# Patient Record
Sex: Female | Born: 1955 | Race: White | Hispanic: No | Marital: Married | Smoking: Former smoker
Health system: Southern US, Community
[De-identification: ages and names within clinical notes are randomized; demographics above are authoritative.]

## PROBLEM LIST (undated history)

## (undated) DIAGNOSIS — T7840XA Allergy, unspecified, initial encounter: Secondary | ICD-10-CM

## (undated) DIAGNOSIS — E785 Hyperlipidemia, unspecified: Secondary | ICD-10-CM

## (undated) DIAGNOSIS — K219 Gastro-esophageal reflux disease without esophagitis: Secondary | ICD-10-CM

## (undated) DIAGNOSIS — M1990 Unspecified osteoarthritis, unspecified site: Secondary | ICD-10-CM

## (undated) DIAGNOSIS — J45909 Unspecified asthma, uncomplicated: Secondary | ICD-10-CM

## (undated) DIAGNOSIS — I10 Essential (primary) hypertension: Secondary | ICD-10-CM

## (undated) HISTORY — DX: Essential (primary) hypertension: I10

## (undated) HISTORY — DX: Gastro-esophageal reflux disease without esophagitis: K21.9

## (undated) HISTORY — PX: CHOLECYSTECTOMY: SHX55

## (undated) HISTORY — DX: Allergy, unspecified, initial encounter: T78.40XA

## (undated) HISTORY — DX: Hyperlipidemia, unspecified: E78.5

## (undated) HISTORY — DX: Unspecified asthma, uncomplicated: J45.909

## (undated) HISTORY — PX: ABDOMINAL HYSTERECTOMY: SHX81

## (undated) HISTORY — DX: Unspecified osteoarthritis, unspecified site: M19.90

## (undated) HISTORY — PX: COLONOSCOPY: SHX174

## (undated) HISTORY — PX: UPPER GASTROINTESTINAL ENDOSCOPY: SHX188

---

## 1999-08-30 ENCOUNTER — Encounter: Payer: Self-pay | Admitting: Emergency Medicine

## 1999-08-30 ENCOUNTER — Emergency Department (HOSPITAL_COMMUNITY): Admission: EM | Admit: 1999-08-30 | Discharge: 1999-08-30 | Payer: Self-pay | Admitting: Emergency Medicine

## 1999-09-02 ENCOUNTER — Emergency Department (HOSPITAL_COMMUNITY): Admission: EM | Admit: 1999-09-02 | Discharge: 1999-09-02 | Payer: Self-pay | Admitting: Emergency Medicine

## 1999-09-03 ENCOUNTER — Encounter: Payer: Self-pay | Admitting: Emergency Medicine

## 2002-12-06 ENCOUNTER — Emergency Department (HOSPITAL_COMMUNITY): Admission: EM | Admit: 2002-12-06 | Discharge: 2002-12-06 | Payer: Self-pay | Admitting: Emergency Medicine

## 2004-05-28 ENCOUNTER — Emergency Department (HOSPITAL_COMMUNITY): Admission: EM | Admit: 2004-05-28 | Discharge: 2004-05-28 | Payer: Self-pay | Admitting: Emergency Medicine

## 2005-07-24 ENCOUNTER — Emergency Department (HOSPITAL_COMMUNITY): Admission: EM | Admit: 2005-07-24 | Discharge: 2005-07-24 | Payer: Self-pay | Admitting: Emergency Medicine

## 2005-07-30 ENCOUNTER — Emergency Department (HOSPITAL_COMMUNITY): Admission: EM | Admit: 2005-07-30 | Discharge: 2005-07-30 | Payer: Self-pay | Admitting: Family Medicine

## 2005-12-02 ENCOUNTER — Ambulatory Visit: Payer: Self-pay | Admitting: Internal Medicine

## 2006-01-09 ENCOUNTER — Ambulatory Visit: Payer: Self-pay | Admitting: Internal Medicine

## 2007-03-07 ENCOUNTER — Emergency Department (HOSPITAL_COMMUNITY): Admission: EM | Admit: 2007-03-07 | Discharge: 2007-03-08 | Payer: Self-pay | Admitting: Emergency Medicine

## 2007-04-27 ENCOUNTER — Other Ambulatory Visit: Admission: RE | Admit: 2007-04-27 | Discharge: 2007-04-27 | Payer: Self-pay | Admitting: Family Medicine

## 2007-11-10 ENCOUNTER — Emergency Department (HOSPITAL_COMMUNITY): Admission: EM | Admit: 2007-11-10 | Discharge: 2007-11-10 | Payer: Self-pay | Admitting: Emergency Medicine

## 2010-12-27 ENCOUNTER — Encounter: Payer: Self-pay | Admitting: Internal Medicine

## 2011-03-05 ENCOUNTER — Other Ambulatory Visit: Payer: Self-pay | Admitting: Physician Assistant

## 2011-03-05 DIAGNOSIS — Z1231 Encounter for screening mammogram for malignant neoplasm of breast: Secondary | ICD-10-CM

## 2011-03-13 ENCOUNTER — Ambulatory Visit
Admission: RE | Admit: 2011-03-13 | Discharge: 2011-03-13 | Disposition: A | Discharge status: Home / Self Care | Payer: 59 | Source: Ambulatory Visit | Attending: Physician Assistant | Admitting: Physician Assistant

## 2011-03-13 DIAGNOSIS — Z1231 Encounter for screening mammogram for malignant neoplasm of breast: Secondary | ICD-10-CM

## 2011-03-24 ENCOUNTER — Other Ambulatory Visit: Payer: Self-pay | Admitting: Physician Assistant

## 2011-03-24 DIAGNOSIS — R928 Other abnormal and inconclusive findings on diagnostic imaging of breast: Secondary | ICD-10-CM

## 2011-03-27 LAB — URINE CULTURE

## 2011-03-27 LAB — URINALYSIS, ROUTINE W REFLEX MICROSCOPIC
Glucose, UA: NEGATIVE
Ketones, ur: NEGATIVE
Nitrite: NEGATIVE
Protein, ur: NEGATIVE
Urobilinogen, UA: 0.2

## 2011-03-27 LAB — URINE MICROSCOPIC-ADD ON

## 2011-04-04 ENCOUNTER — Ambulatory Visit
Admission: RE | Admit: 2011-04-04 | Discharge: 2011-04-04 | Disposition: A | Discharge status: Home / Self Care | Payer: 59 | Source: Ambulatory Visit | Attending: Physician Assistant | Admitting: Physician Assistant

## 2011-04-04 DIAGNOSIS — R928 Other abnormal and inconclusive findings on diagnostic imaging of breast: Secondary | ICD-10-CM

## 2011-04-07 ENCOUNTER — Other Ambulatory Visit: Payer: Self-pay | Admitting: Internal Medicine

## 2012-03-08 ENCOUNTER — Encounter: Payer: Self-pay | Admitting: Internal Medicine

## 2012-04-01 ENCOUNTER — Encounter: Payer: Self-pay | Admitting: Internal Medicine

## 2012-04-22 ENCOUNTER — Ambulatory Visit (AMBULATORY_SURGERY_CENTER): Payer: 59 | Admitting: *Deleted

## 2012-04-22 ENCOUNTER — Encounter: Payer: Self-pay | Admitting: Internal Medicine

## 2012-04-22 VITALS — Ht 60.0 in | Wt 210.2 lb

## 2012-04-22 DIAGNOSIS — Z800 Family history of malignant neoplasm of digestive organs: Secondary | ICD-10-CM

## 2012-04-22 DIAGNOSIS — Z1211 Encounter for screening for malignant neoplasm of colon: Secondary | ICD-10-CM

## 2012-04-22 MED ORDER — NA SULFATE-K SULFATE-MG SULF 17.5-3.13-1.6 GM/177ML PO SOLN: 1.0000 | Freq: Once | ORAL | Status: DC

## 2012-04-22 NOTE — Progress Notes (Signed)
No allergy to egg or soy products  

## 2012-05-04 ENCOUNTER — Other Ambulatory Visit (HOSPITAL_COMMUNITY)
Admission: RE | Admit: 2012-05-04 | Discharge: 2012-05-04 | Disposition: A | Discharge status: Home / Self Care | Payer: 59 | Source: Ambulatory Visit | Attending: Family Medicine | Admitting: Family Medicine

## 2012-05-04 ENCOUNTER — Other Ambulatory Visit: Payer: Self-pay | Admitting: Physician Assistant

## 2012-05-04 DIAGNOSIS — Z0000 Encounter for general adult medical examination without abnormal findings: Secondary | ICD-10-CM | POA: Insufficient documentation

## 2012-05-06 ENCOUNTER — Ambulatory Visit (AMBULATORY_SURGERY_CENTER): Payer: 59 | Admitting: Internal Medicine

## 2012-05-06 ENCOUNTER — Encounter: Payer: Self-pay | Admitting: Internal Medicine

## 2012-05-06 VITALS — BP 109/76 | HR 62 | Temp 98.2°F | Resp 19 | Ht 60.0 in | Wt 210.0 lb

## 2012-05-06 DIAGNOSIS — D126 Benign neoplasm of colon, unspecified: Secondary | ICD-10-CM

## 2012-05-06 DIAGNOSIS — K635 Polyp of colon: Secondary | ICD-10-CM

## 2012-05-06 DIAGNOSIS — Z800 Family history of malignant neoplasm of digestive organs: Secondary | ICD-10-CM

## 2012-05-06 DIAGNOSIS — K5730 Diverticulosis of large intestine without perforation or abscess without bleeding: Secondary | ICD-10-CM

## 2012-05-06 DIAGNOSIS — Z1211 Encounter for screening for malignant neoplasm of colon: Secondary | ICD-10-CM

## 2012-05-06 DIAGNOSIS — K648 Other hemorrhoids: Secondary | ICD-10-CM

## 2012-05-06 MED ORDER — SODIUM CHLORIDE 0.9 % IV SOLN: 500.0000 mL | INTRAVENOUS | Status: DC

## 2012-05-06 NOTE — Progress Notes (Signed)
Propofol given over incremental dosages 

## 2012-05-06 NOTE — Op Note (Signed)
[facility] [address] [place], [address]   COLONOSCOPY PROCEDURE REPORT  PATIENT: Alicia Miranda, Alicia Miranda  MR#: [id number] BIRTHDATE: [birth year] , 56  yrs. old GENDER: Female ENDOSCOPIST: Iva Boop, MD, [facility] PROCEDURE DATE:  05/06/2012 PROCEDURE:   Colonoscopy with snare polypectomy ASA CLASS:   Class II INDICATIONS:Patient's immediate family history of colon cancer and elevated risk screening. MEDICATIONS: propofol (Diprivan) 250mg  IV, MAC sedation, administered by CRNA, and These medications were titrated to patient response per physician's verbal order  DESCRIPTION OF PROCEDURE:   After the risks benefits and alternatives of the procedure were thoroughly explained, informed consent was obtained.  A digital rectal exam revealed no abnormalities of the rectum.   The LB [id number]  endoscope was introduced through the anus and advanced to the cecum, which was identified by both the appendix and ileocecal valve. No adverse events experienced.   The quality of the prep was Suprep good  The instrument was then slowly withdrawn as the colon was fully examined.      COLON FINDINGS: A smooth sessile polyp measuring 5 mm in size was found in the sigmoid colon.  A polypectomy was performed with a cold snare.  The resection was complete and the polyp tissue was completely retrieved.   Moderate diverticulosis was noted throughout the entire examined colon.   Small internal hemorrhoids were found.   The colon mucosa was otherwise normal.  Retroflexed views revealed internal hemorrhoids. The time to cecum=2 minutes 00 seconds.  Withdrawal time=10 minutes 15 seconds.  The scope was withdrawn and the procedure completed. COMPLICATIONS: There were no complications.  ENDOSCOPIC IMPRESSION: 1.   Sessile polyp measuring 5 mm in size was found in the sigmoid colon; polypectomy was performed with a cold snare 2.   Moderate diverticulosis was noted throughout the  entire examined colon 3.   Small internal hemorrhoids 4.   The colon mucosa was otherwise normal with good prep - family hx colon cancer brother dx 39  RECOMMENDATIONS: Timing of repeat colonoscopy will be determined by pathology findings.   eSigned:  Iva Boop, MD, [facility] 05/06/2012 11:55 AM cc: The Patient    and Carolyne Fiscal, MD

## 2012-05-06 NOTE — Progress Notes (Signed)
Patient did not experience any of the following events: a burn prior to discharge; a fall within the facility; wrong site/side/patient/procedure/implant event; or a hospital transfer or hospital admission upon discharge from the facility. (G8907) Patient did not have preoperative order for IV antibiotic SSI prophylaxis. (G8918)   Charted by Lucindia Mirts RN 

## 2012-05-06 NOTE — Patient Instructions (Addendum)
A small polyp was removed. It looks benign. Diverticulosis and internal hemorrhoids were also seen as before.  Thank you for choosing me and [facility].  Iva Boop, MD, FACG YOU HAD AN ENDOSCOPIC PROCEDURE TODAY AT THE [facility]: Refer to the procedure report that was given to you for any specific questions about what was found during the examination.  If the procedure report does not answer your questions, please call your gastroenterologist to clarify.  If you requested that your care partner not be given the details of your procedure findings, then the procedure report has been included in a sealed envelope for you to review at your convenience later.  YOU SHOULD EXPECT: Some feelings of bloating in the abdomen. Passage of more gas than usual.  Walking can help get rid of the air that was put into your GI tract during the procedure and reduce the bloating. If you had a lower endoscopy (such as a colonoscopy or flexible sigmoidoscopy) you may notice spotting of blood in your stool or on the toilet paper. If you underwent a bowel prep for your procedure, then you may not have a normal bowel movement for a few days.  DIET: Your first meal following the procedure should be a light meal and then it is ok to progress to your normal diet.  A half-sandwich or bowl of soup is an example of a good first meal.  Heavy or fried foods are harder to digest and may make you feel nauseous or bloated.  Likewise meals heavy in dairy and vegetables can cause extra gas to form and this can also increase the bloating.  Drink plenty of fluids but you should avoid alcoholic beverages for 24 hours.  ACTIVITY: Your care partner should take you home directly after the procedure.  You should plan to take it easy, moving slowly for the rest of the day.  You can resume normal activity the day after the procedure however you should NOT DRIVE or use heavy machinery for 24 hours (because of the  sedation medicines used during the test).    SYMPTOMS TO REPORT IMMEDIATELY: A gastroenterologist can be reached at any hour.  During normal business hours, 8:30 AM to 5:00 PM Monday through Friday, call [phone].  After hours and on weekends, please call the GI answering service at [phone] who will take a message and have the physician on call contact you.   Following lower endoscopy (colonoscopy or flexible sigmoidoscopy):  Excessive amounts of blood in the stool  Significant tenderness or worsening of abdominal pains  Swelling of the abdomen that is new, acute  Fever of 100F or higher  FOLLOW UP: If any biopsies were taken you will be contacted by phone or by letter within the next 1-3 weeks.  Call your gastroenterologist if you have not heard about the biopsies in 3 weeks.  Our staff will call the home number listed on your records the next business day following your procedure to check on you and address any questions or concerns that you may have at that time regarding the information given to you following your procedure. This is a courtesy call and so if there is no answer at the home number and we have not heard from you through the emergency physician on call, we will assume that you have returned to your regular daily activities without incident.  SIGNATURES/CONFIDENTIALITY: You and/or your care partner have signed paperwork which will be entered into your electronic medical  record.  These signatures attest to the fact that that the information above on your After Visit Summary has been reviewed and is understood.  Full responsibility of the confidentiality of this discharge information lies with you and/or your care-partner.   Recommendations:  Polyps-handout given  Diverticulosis- handout given  High fiber diet-handout given  Hemorrhoids-handout given  Repeat colonoscopy determined by pathology

## 2012-05-07 ENCOUNTER — Telehealth: Payer: Self-pay | Admitting: *Deleted

## 2012-05-07 NOTE — Telephone Encounter (Signed)
Left message that called for f/u 

## 2012-05-10 ENCOUNTER — Other Ambulatory Visit: Payer: Self-pay | Admitting: Physician Assistant

## 2012-05-10 DIAGNOSIS — Z1231 Encounter for screening mammogram for malignant neoplasm of breast: Secondary | ICD-10-CM

## 2012-05-12 ENCOUNTER — Encounter: Payer: Self-pay | Admitting: Internal Medicine

## 2012-05-12 NOTE — Progress Notes (Signed)
Quick Note:  Not a polyp Repeat colonoscopy 04/2017 - Fam Hx CRCA ______

## 2012-06-01 ENCOUNTER — Ambulatory Visit
Admission: RE | Admit: 2012-06-01 | Discharge: 2012-06-01 | Disposition: A | Discharge status: Home / Self Care | Payer: 59 | Source: Ambulatory Visit | Attending: Physician Assistant | Admitting: Physician Assistant

## 2012-06-01 DIAGNOSIS — Z1231 Encounter for screening mammogram for malignant neoplasm of breast: Secondary | ICD-10-CM

## 2012-12-24 ENCOUNTER — Other Ambulatory Visit: Payer: Self-pay | Admitting: Physician Assistant

## 2012-12-24 ENCOUNTER — Ambulatory Visit
Admission: RE | Admit: 2012-12-24 | Discharge: 2012-12-24 | Disposition: A | Discharge status: Home / Self Care | Payer: PRIVATE HEALTH INSURANCE | Source: Ambulatory Visit | Attending: Physician Assistant | Admitting: Physician Assistant

## 2012-12-24 DIAGNOSIS — R52 Pain, unspecified: Secondary | ICD-10-CM

## 2012-12-24 DIAGNOSIS — R609 Edema, unspecified: Secondary | ICD-10-CM

## 2013-03-24 ENCOUNTER — Emergency Department (HOSPITAL_COMMUNITY)
Admission: EM | Admit: 2013-03-24 | Discharge: 2013-03-24 | Disposition: A | Discharge status: Home / Self Care | Payer: PRIVATE HEALTH INSURANCE | Attending: Emergency Medicine | Admitting: Emergency Medicine

## 2013-03-24 ENCOUNTER — Encounter (HOSPITAL_COMMUNITY): Payer: Self-pay | Admitting: Emergency Medicine

## 2013-03-24 ENCOUNTER — Emergency Department (HOSPITAL_COMMUNITY): Payer: PRIVATE HEALTH INSURANCE

## 2013-03-24 DIAGNOSIS — I10 Essential (primary) hypertension: Secondary | ICD-10-CM | POA: Insufficient documentation

## 2013-03-24 DIAGNOSIS — J45909 Unspecified asthma, uncomplicated: Secondary | ICD-10-CM | POA: Insufficient documentation

## 2013-03-24 DIAGNOSIS — S93499A Sprain of other ligament of unspecified ankle, initial encounter: Secondary | ICD-10-CM | POA: Insufficient documentation

## 2013-03-24 DIAGNOSIS — M129 Arthropathy, unspecified: Secondary | ICD-10-CM | POA: Insufficient documentation

## 2013-03-24 DIAGNOSIS — Y92009 Unspecified place in unspecified non-institutional (private) residence as the place of occurrence of the external cause: Secondary | ICD-10-CM | POA: Insufficient documentation

## 2013-03-24 DIAGNOSIS — W010XXA Fall on same level from slipping, tripping and stumbling without subsequent striking against object, initial encounter: Secondary | ICD-10-CM | POA: Insufficient documentation

## 2013-03-24 DIAGNOSIS — K219 Gastro-esophageal reflux disease without esophagitis: Secondary | ICD-10-CM | POA: Insufficient documentation

## 2013-03-24 DIAGNOSIS — Z87891 Personal history of nicotine dependence: Secondary | ICD-10-CM | POA: Insufficient documentation

## 2013-03-24 DIAGNOSIS — E785 Hyperlipidemia, unspecified: Secondary | ICD-10-CM | POA: Insufficient documentation

## 2013-03-24 DIAGNOSIS — S86012A Strain of left Achilles tendon, initial encounter: Secondary | ICD-10-CM

## 2013-03-24 DIAGNOSIS — Y9301 Activity, walking, marching and hiking: Secondary | ICD-10-CM | POA: Insufficient documentation

## 2013-03-24 DIAGNOSIS — Z79899 Other long term (current) drug therapy: Secondary | ICD-10-CM | POA: Insufficient documentation

## 2013-03-24 MED ORDER — HYDROCODONE-ACETAMINOPHEN 5-325 MG PO TABS: 1.0000 | ORAL_TABLET | ORAL | Status: DC | PRN

## 2013-03-24 NOTE — ED Notes (Signed)
Ortho tech at bedside 

## 2013-03-24 NOTE — ED Provider Notes (Signed)
CSN: [id number]     Arrival date & time 03/24/13  1909 History  This chart was scribed for non-physician practitioner Coral Ceo, PA-C, working with Richardean Canal, MD by Dorothey Baseman, ED Scribe. This patient was seen in room TR09C/TR09C and the patient's care was started at 9:27 PM.    Chief Complaint  Patient presents with  . Fall  . Ankle Injury   The history is provided by the patient. No language interpreter was used.   HPI Comments: Alicia Miranda is a 57 y.o. female with a PMH of arthritis, asthma, GERD, HLD, and HTN who presents to the Emergency Department complaining of a fall and ankle injury. Patient states that about 3 hours prior to arrival she was walking out her door when she stepped down and felt a sharp sudden pain in the back of her left ankle with radiation up the back of her left calf.  She denies any inversion or eversion type of injury. She denies any fall or other injuries (confirmed with patient - contradicting with nursing notes). She states she has been able to ambulate however with difficulty due to pain. She denies any weakness, loss of sensation, numbness, or tingling. She denies any pain in her ankle or calf prior to the onset of her injury.  Patient reports that the pain is exacerbated with walking. She reports some associated swelling to the area. Patient reports that she took some arthritis medication earlier this morning, but has not taken anything for pain since her incident. She denies any previous fx or surgeries on her left ankle. She denies fever, nausea, vomiting, abdominal pain, or knee pain. Patient denies any chronic steroid medication use or recent antibiotic use. Patient is a non-smoker, however, has smoke exposure.    Past Medical History  Diagnosis Date  . Allergy   . Arthritis     legs  . Asthma   . GERD (gastroesophageal reflux disease)   . Hyperlipidemia   . Hypertension    Past Surgical History  Procedure Laterality Date  . Abdominal  hysterectomy    . Cholecystectomy    . Upper gastrointestinal endoscopy    . Colonoscopy     Family History  Problem Relation Age of Onset  . Colon cancer Brother   . Esophageal cancer Neg Hx   . Rectal cancer Neg Hx   . Stomach cancer Neg Hx    History  Substance Use Topics  . Smoking status: Former Games developer  . Smokeless tobacco: Never Used  . Alcohol Use: Yes     Comment: very rarely   OB History   Grav Para Term Preterm Abortions TAB SAB Ect Mult Living                 Review of Systems  Constitutional: Negative for fever, chills, activity change, appetite change and fatigue.  HENT: Negative for trouble swallowing.   Eyes: Negative for visual disturbance.  Respiratory: Negative for cough and shortness of breath.   Cardiovascular: Negative for chest pain, palpitations and leg swelling.  Gastrointestinal: Negative for nausea, vomiting and abdominal pain.  Genitourinary: Negative for dysuria.  Musculoskeletal: Positive for arthralgias ( positive for left ankle pain, negative for knee pain), gait problem and joint swelling. Negative for back pain, myalgias and neck pain.  Skin: Negative for wound.  Neurological: Negative for dizziness, syncope, weakness, light-headedness, numbness and headaches.  Psychiatric/Behavioral: Negative for confusion.    Allergies  Codeine  Home Medications   Current Outpatient Rx  Name  Route  Sig  Dispense  Refill  . diclofenac (VOLTAREN) 75 MG EC tablet      1 tablet Twice daily as needed.         . furosemide (LASIX) 20 MG tablet      Takes 1-2 tablets daily         . losartan (COZAAR) 50 MG tablet      1 tablet once.         . Misc Natural Products (OSTEO BI-FLEX JOINT SHIELD PO)   Oral   Take 1 tablet by mouth daily.         . Multiple Vitamins-Minerals (MULTIVITAMIN PO)   Oral   Take by mouth daily.         . Omega-3 Fatty Acids (FISH OIL PO)   Oral   Take by mouth daily.         . pantoprazole (PROTONIX)  20 MG tablet   Oral   Take 20 mg by mouth daily. May take 1 to 2 tablets daily         . rosuvastatin (CRESTOR) 20 MG tablet   Oral   Take 20 mg by mouth daily. Takes 0.5 tablet daily          Triage Vitals: Pulse 81  Temp(Src) 98.2 F (36.8 C) (Oral)  Resp 20  Ht 5' (1.524 m)  Wt 218 lb 0.6 oz (98.9 kg)  BMI 42.58 kg/m2  SpO2 94%  Filed Vitals:   03/24/13 1916 03/24/13 2140 03/24/13 2258  BP:  139/81   Pulse: 81 77 68  Temp: 98.2 F (36.8 C)    TempSrc: Oral    Resp: 20 16   Height: 5' (1.524 m)    Weight: 218 lb 0.6 oz (98.9 kg)    SpO2: 94% 95% 98%    Physical Exam  Nursing note and vitals reviewed. Constitutional: She is oriented to person, place, and time. She appears well-developed and well-nourished. No distress.  HENT:  Head: Normocephalic and atraumatic.  Right Ear: External ear normal.  Left Ear: External ear normal.  Eyes: Conjunctivae are normal. Right eye exhibits no discharge. Left eye exhibits no discharge.  Neck: Normal range of motion. Neck supple.  Cardiovascular: Normal rate, regular rhythm, normal heart sounds and intact distal pulses.  Exam reveals no gallop and no friction rub.   No murmur heard. Dorsalis pedis pulses present bilaterally  Pulmonary/Chest: Effort normal and breath sounds normal. No respiratory distress. She has no wheezes. She has no rales. She exhibits no tenderness.  Abdominal: Soft. She exhibits no distension. There is no tenderness.  Musculoskeletal: Normal range of motion. She exhibits edema and tenderness.  Palpable gap to the area of the achilles tendon on the left. Positive Thompson's test on the left. Negative Thompson test on the right. Tenderness to the posterior ankle and distal calf. No tenderness to palpation to the malleoli bilaterally, calcaneus, or foot diffusely. No knee tenderness bilaterally. Patient able to actively dorsiflex and plantarflex left ankle. Patient able to flex and extend digits of left foot  without difficulty or limitations. Patient able to actively flex and extend knees and hips bilaterally. No calf edema or erythema bilaterally.   Neurological: She is alert and oriented to person, place, and time.  Sensation intact in the lower extremities bilaterally  Skin: Skin is warm and dry.  Mild edema to the posterior left ankle diffusely. No erythema, ecchymosis, or open wounds.    Psychiatric: She has a normal  mood and affect. Her behavior is normal.    ED Course  Procedures (including critical care time)  DIAGNOSTIC STUDIES: Oxygen Saturation is 94% on room air, adequate by my interpretation.    COORDINATION OF CARE: 9:31PM- Discussed that x-ray results do not indicate any fractures, but that symptoms may be due to a partially ruptured patellar tendon. Discussed treatment plan with patient at bedside and patient verbalized agreement.   Labs Review Labs Reviewed - No data to display  Imaging Review Dg Ankle Complete Left  03/24/2013   CLINICAL DATA:  Proximal left foot pain following an injury today.  EXAM: LEFT ANKLE COMPLETE - 3+ VIEW  COMPARISON:  None.  FINDINGS: Large inferior and posterior calcaneal spurs. Mild distal medial malleolus and adjacent talar spur formation. No fracture, dislocation or effusion seen.  IMPRESSION: No fracture. Degenerative changes.   Electronically Signed   By: Gordan Payment M.D.   On: 03/24/2013 19:52    EKG Interpretation   None       MDM  No diagnosis found.  Kyra Riga is a 57 y.o. female with a PMH of arthritis, asthma, GERD, HLD, and HTN who presents to the Emergency Department complaining of a fall and ankle injury.  X-rays of left ankle ordered to further evaluate. Patient placed in posterior leg splint with slight plantarflexion. Crutches were also given.    Rechecks  11:20 PM = Evaluated splint. Neurovascularly intact. Patient dressed sitting in the chair ready for discharge.   Etiology of ankle pain is possibly due to a  partial/complete rupture of the achilles tendon.  X-rays were negative for fx or malalignment.  Patient was neurovascularly intact. Patient was prescribed Vicodin for outpatient management.  She was given referral to orthopedics and instructed to make an appointment as soon as possible for a visit. She was instructed to be non-weight bearing until evaluated by orthopedics. She was instructed to ice, elevate, and rest. Patient was instructed to return to the ED if they experience any unilateral leg edema/erythema, cyanosis, loss of sensation, weakness, fever, or other concerns.  Patient was in agreement with discharge and plan.  Obtaining ride home from person who is present with her in the ED (relationships unknown).     Final impressions: 1. Ruptured achilles tendon, left     Luiz Iron PA-C   This patient was discussed with Dr. Silverio Lay    I personally performed the services described in this documentation, which was scribed in my presence. The recorded information has been reviewed and is accurate.     Jillyn Ledger, PA-C 03/26/13 1306

## 2013-03-24 NOTE — ED Notes (Signed)
Patient transported to X-ray 

## 2013-03-24 NOTE — ED Notes (Signed)
Pt states that she was walking and tripped and she may have twisted her left ankle. Pt states that she is having a hard time walking on her left ankle and she does have swelling noted to left ankle.

## 2013-03-24 NOTE — Progress Notes (Signed)
Orthopedic Tech Progress Note Patient Details:  Alicia Miranda [birth year] [id number]  Ortho Devices Type of Ortho Device: Ace wrap;Post (short leg) splint;Crutches Ortho Device/Splint Location: LLE Ortho Device/Splint Interventions: Ordered;Application   Jennye Moccasin 03/24/2013, 10:18 PM

## 2013-03-24 NOTE — ED Notes (Signed)
Ortho tech notified for crutches and left lower short leg splint

## 2013-03-24 NOTE — ED Notes (Signed)
PA at bedside.

## 2013-03-26 NOTE — ED Provider Notes (Signed)
Medical screening examination/treatment/procedure(s) were performed by non-physician practitioner and as supervising physician I was immediately available for consultation/collaboration.   Richardean Canal, MD 03/26/13 1501

## 2013-11-21 ENCOUNTER — Other Ambulatory Visit: Payer: Self-pay

## 2013-11-21 DIAGNOSIS — Z1231 Encounter for screening mammogram for malignant neoplasm of breast: Secondary | ICD-10-CM

## 2013-12-02 ENCOUNTER — Ambulatory Visit
Admission: RE | Admit: 2013-12-02 | Discharge: 2013-12-02 | Disposition: A | Discharge status: Home / Self Care | Payer: BC Managed Care – PPO | Source: Ambulatory Visit

## 2013-12-02 ENCOUNTER — Encounter (INDEPENDENT_AMBULATORY_CARE_PROVIDER_SITE_OTHER): Payer: Self-pay

## 2013-12-02 DIAGNOSIS — Z1231 Encounter for screening mammogram for malignant neoplasm of breast: Secondary | ICD-10-CM

## 2014-10-31 ENCOUNTER — Other Ambulatory Visit: Payer: Self-pay

## 2014-10-31 DIAGNOSIS — Z1231 Encounter for screening mammogram for malignant neoplasm of breast: Secondary | ICD-10-CM

## 2014-12-14 ENCOUNTER — Ambulatory Visit: Payer: Self-pay

## 2014-12-28 ENCOUNTER — Ambulatory Visit
Admission: RE | Admit: 2014-12-28 | Discharge: 2014-12-28 | Disposition: A | Discharge status: Home / Self Care | Payer: 59 | Source: Ambulatory Visit

## 2014-12-28 DIAGNOSIS — Z1231 Encounter for screening mammogram for malignant neoplasm of breast: Secondary | ICD-10-CM

## 2015-02-28 ENCOUNTER — Emergency Department (HOSPITAL_COMMUNITY)
Admission: EM | Admit: 2015-02-28 | Discharge: 2015-02-28 | Disposition: A | Discharge status: Home / Self Care | Payer: 59 | Attending: Emergency Medicine | Admitting: Emergency Medicine

## 2015-02-28 ENCOUNTER — Emergency Department (HOSPITAL_COMMUNITY): Payer: 59

## 2015-02-28 ENCOUNTER — Encounter (HOSPITAL_COMMUNITY): Payer: Self-pay | Admitting: Neurology

## 2015-02-28 DIAGNOSIS — Z87891 Personal history of nicotine dependence: Secondary | ICD-10-CM | POA: Diagnosis not present

## 2015-02-28 DIAGNOSIS — M25562 Pain in left knee: Secondary | ICD-10-CM

## 2015-02-28 DIAGNOSIS — K219 Gastro-esophageal reflux disease without esophagitis: Secondary | ICD-10-CM | POA: Diagnosis not present

## 2015-02-28 DIAGNOSIS — E785 Hyperlipidemia, unspecified: Secondary | ICD-10-CM | POA: Diagnosis not present

## 2015-02-28 DIAGNOSIS — M1712 Unilateral primary osteoarthritis, left knee: Secondary | ICD-10-CM | POA: Diagnosis not present

## 2015-02-28 DIAGNOSIS — I10 Essential (primary) hypertension: Secondary | ICD-10-CM | POA: Diagnosis not present

## 2015-02-28 DIAGNOSIS — M1710 Unilateral primary osteoarthritis, unspecified knee: Secondary | ICD-10-CM

## 2015-02-28 DIAGNOSIS — Z79899 Other long term (current) drug therapy: Secondary | ICD-10-CM | POA: Insufficient documentation

## 2015-02-28 DIAGNOSIS — J45909 Unspecified asthma, uncomplicated: Secondary | ICD-10-CM | POA: Diagnosis not present

## 2015-02-28 MED ORDER — TRAMADOL HCL 50 MG PO TABS: 50.0000 mg | ORAL_TABLET | Freq: Four times a day (QID) | ORAL | Status: DC | PRN

## 2015-02-28 MED ORDER — TRAMADOL HCL 50 MG PO TABS
50.0000 mg | ORAL_TABLET | Freq: Once | ORAL | Status: AC
  Administered 2015-02-28: 50 mg via ORAL
  Filled 2015-02-28: qty 1

## 2015-02-28 NOTE — ED Provider Notes (Signed)
CSN: [id number]     Arrival date & time 02/28/15  1136 History  This chart was scribed for non-physician practitioner, Delos Haring, PA-C working with Alfonzo Beers, MD, by Erling Conte, ED Scribe. This patient was seen in room TR10C/TR10C and the patient's care was started at 1:01 PM.       Chief Complaint  Patient presents with  . Knee Pain   The history is provided by the patient. No language interpreter was used.    HPI Comments: Alicia Miranda is a 59 y.o. female with a h/o arthritis who presents to the Emergency Department complaining of constant, moderate, gradually worsening, burning, left knee pain onset 3 days. She reports associated redness and swelling to the knee. Pt states that rest helps to alleviate her symptoms. She endorses that ambulation and applied pressure exacerbates the pain. She went to an UC on Monday, 2 days ago, and was put on a new arthritis medication, Mobic. Pt has been compliant with the medication and states that it is not offering her significant pain relief. She denies any new injury to the area. She is ambulatory without difficulty. Pt has followed up with an orthopedist, [facility], in the past for this issue but does not follow up with them regularly. She endorses that at her appts with [facility] they have just been giving her injections. She has an appt in 3 days. Pt reports she was told her ssues may be surgical in the future. She denies any numbness or weakness.  Past Medical History  Diagnosis Date  . Allergy   . Arthritis     legs  . Asthma   . GERD (gastroesophageal reflux disease)   . Hyperlipidemia   . Hypertension    Past Surgical History  Procedure Laterality Date  . Abdominal hysterectomy    . Cholecystectomy    . Upper gastrointestinal endoscopy    . Colonoscopy     Family History  Problem Relation Age of Onset  . Colon cancer Brother   . Esophageal cancer Neg Hx   . Rectal cancer Neg Hx   . Stomach cancer Neg Hx    Social  History  Substance Use Topics  . Smoking status: Former Research scientist (life sciences)  . Smokeless tobacco: Never Used  . Alcohol Use: Yes     Comment: very rarely   OB History    No data available     Review of Systems  Musculoskeletal: Positive for joint swelling and arthralgias. Negative for gait problem.  Skin: Positive for color change.  Neurological: Negative for weakness and numbness.  All other systems reviewed and are negative.     Allergies  Codeine  Home Medications   Prior to Admission medications   Medication Sig Start Date End Date Taking? Authorizing Provider  diclofenac (VOLTAREN) 75 MG EC tablet 1 tablet Twice daily as needed (inflammation).  04/08/12   Historical Provider, MD  furosemide (LASIX) 20 MG tablet 20-40 mg daily.  03/12/12   Historical Provider, MD  HYDROcodone-acetaminophen (NORCO/VICODIN) 5-325 MG per tablet Take 1-2 tablets by mouth every 4 (four) hours as needed for pain. 03/24/13   Lucila Maine, PA-C  losartan (COZAAR) 50 MG tablet Take 50 mg by mouth once.  04/22/12   Historical Provider, MD  Misc Natural Products (OSTEO BI-FLEX JOINT SHIELD PO) Take 1 tablet by mouth daily.    Historical Provider, MD  Multiple Vitamins-Minerals (MULTIVITAMIN PO) Take by mouth daily.    Historical Provider, MD  Omega-3 Fatty Acids ([place]  OIL PO) Take by mouth daily.    Historical Provider, MD  pantoprazole (PROTONIX) 20 MG tablet Take 20 mg by mouth daily. May take 1 to 2 tablets daily    Historical Provider, MD  rosuvastatin (CRESTOR) 20 MG tablet Take 20 mg by mouth daily. Takes 0.5 tablet daily    Historical Provider, MD  traMADol (ULTRAM) 50 MG tablet Take 1 tablet (50 mg total) by mouth every 6 (six) hours as needed for severe pain. 02/28/15   Delos Haring, PA-C   Triage Vitals: 128/96 mmHg  Pulse 78  Temp(Src) 98.1 F (36.7 C) (Oral)  Resp 16  Ht 5' (1.524 m)  Wt 225 lb (102.059 kg)  BMI 43.94 kg/m2  SpO2 95%  Physical Exam  Constitutional: She is oriented to  person, place, and time. She appears well-developed and well-nourished. No distress.  HENT:  Head: Normocephalic and atraumatic.  Eyes: Conjunctivae and EOM are normal.  Neck: Neck supple. No tracheal deviation present.  Cardiovascular: Normal rate.   Pulmonary/Chest: Effort normal. No respiratory distress.  Musculoskeletal:       Left knee: She exhibits decreased range of motion. She exhibits no swelling, no effusion, no ecchymosis, no deformity, no laceration, no erythema, normal alignment, no LCL laxity, normal patellar mobility, no bony tenderness, normal meniscus and no MCL laxity. Tenderness (diffuse) found. No lateral joint line, no MCL, no LCL and no patellar tendon tenderness noted.       Legs: Neurological: She is alert and oriented to person, place, and time.  Skin: Skin is warm and dry.  Psychiatric: She has a normal mood and affect. Her behavior is normal.  Nursing note and vitals reviewed.   ED Course  Procedures (including critical care time)  DIAGNOSTIC STUDIES: Oxygen Saturation is 95% on RA, normal by my interpretation.    COORDINATION OF CARE: 11:50 AM- Will order imaging of left knee. Pt advised of plan for treatment and pt agrees.  1:16 PM- Pt requests a referral to an orthopedist because she feels her current physician is not helping her. Will provide pt with referral to use if needed. Will prescribe short course of Tramadol. Pt advised that she has tricompartmental osteoarthritis with a 6 mm loose body.    IMPRESSION: Tricompartmental osteoarthritis with minimal progression since 2014   Labs Review Labs Reviewed - No data to display  Imaging Review Dg Knee Complete 4 Views Left  02/28/2015   CLINICAL DATA:  Fall in [place]. Burning sensation the left knee. Initial encounter.  EXAM: LEFT KNEE - COMPLETE 4+ VIEW  COMPARISON:  12/24/2012  FINDINGS: Tricompartmental osteoarthritis with marginal spurring and mild borderline moderate medial compartment narrowing.  Spurs and joint narrowing appear minimally progressed. 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 No fracture, erosion, or joint effusion.  IMPRESSION: Tricompartmental osteoarthritis with minimal progression since 2014   Electronically Signed   By: Monte Fantasia M.D.   On: 02/28/2015 12:30   I have personally reviewed and evaluated these images and lab results as part of my medical decision-making.   EKG Interpretation None      MDM   Final diagnoses:  Left knee pain  Tricompartmental disease of knee    Medications  traMADol (ULTRAM) tablet 50 mg (50 mg Oral Given 02/28/15 1319)    59 y.o.Aviana Lesh's evaluation in the Emergency Department is complete. It has been determined that no acute conditions requiring further emergency intervention are present at this time. The patient/guardian have  been advised of the diagnosis and plan. We have discussed signs and symptoms that warrant return to the ED, such as changes or worsening in symptoms.  Vital signs are stable at discharge. Filed Vitals:   02/28/15 1144  BP: 128/96  Pulse: 78  Temp: 98.1 F (36.7 C)  Resp: 16    Patient/guardian has voiced understanding and agreed to follow-up with the PCP or specialist.  I personally performed the services described in this documentation, which was scribed in my presence. The recorded information has been reviewed and is accurate.    Delos Haring, PA-C 02/28/15 [place], MD 02/28/15 [phone]

## 2015-02-28 NOTE — Discharge Instructions (Signed)

## 2015-02-28 NOTE — ED Notes (Signed)
Reports burning to her left knee since Monday. Was started on new arthritis medication on Monday. Denies injury. Able to bear wt.

## 2016-03-14 ENCOUNTER — Other Ambulatory Visit: Payer: Self-pay | Admitting: Family Medicine

## 2016-03-14 DIAGNOSIS — Z1231 Encounter for screening mammogram for malignant neoplasm of breast: Secondary | ICD-10-CM

## 2016-03-27 ENCOUNTER — Ambulatory Visit: Payer: 59

## 2016-05-12 ENCOUNTER — Ambulatory Visit: Payer: 59

## 2016-05-13 ENCOUNTER — Other Ambulatory Visit: Payer: Self-pay | Admitting: Family Medicine

## 2016-05-13 DIAGNOSIS — Z1231 Encounter for screening mammogram for malignant neoplasm of breast: Secondary | ICD-10-CM

## 2016-06-19 ENCOUNTER — Ambulatory Visit: Payer: 59

## 2016-08-04 DIAGNOSIS — M170 Bilateral primary osteoarthritis of knee: Secondary | ICD-10-CM | POA: Diagnosis not present

## 2016-08-04 DIAGNOSIS — M1712 Unilateral primary osteoarthritis, left knee: Secondary | ICD-10-CM | POA: Diagnosis not present

## 2016-08-04 DIAGNOSIS — M1711 Unilateral primary osteoarthritis, right knee: Secondary | ICD-10-CM | POA: Diagnosis not present

## 2016-08-28 DIAGNOSIS — I10 Essential (primary) hypertension: Secondary | ICD-10-CM | POA: Diagnosis not present

## 2016-08-28 DIAGNOSIS — Z0000 Encounter for general adult medical examination without abnormal findings: Secondary | ICD-10-CM | POA: Diagnosis not present

## 2016-08-28 DIAGNOSIS — M1990 Unspecified osteoarthritis, unspecified site: Secondary | ICD-10-CM | POA: Diagnosis not present

## 2016-08-28 DIAGNOSIS — E559 Vitamin D deficiency, unspecified: Secondary | ICD-10-CM | POA: Diagnosis not present

## 2016-08-28 DIAGNOSIS — E7800 Pure hypercholesterolemia, unspecified: Secondary | ICD-10-CM | POA: Diagnosis not present

## 2016-08-28 DIAGNOSIS — Z23 Encounter for immunization: Secondary | ICD-10-CM | POA: Diagnosis not present

## 2017-02-23 ENCOUNTER — Other Ambulatory Visit (HOSPITAL_COMMUNITY): Payer: Self-pay | Admitting: Family Medicine

## 2017-02-23 DIAGNOSIS — Z1231 Encounter for screening mammogram for malignant neoplasm of breast: Secondary | ICD-10-CM

## 2017-02-25 ENCOUNTER — Ambulatory Visit (HOSPITAL_COMMUNITY)
Admission: RE | Admit: 2017-02-25 | Discharge: 2017-02-25 | Disposition: A | Discharge status: Home / Self Care | Payer: 59 | Source: Ambulatory Visit | Attending: Family Medicine | Admitting: Family Medicine

## 2017-02-25 DIAGNOSIS — Z1231 Encounter for screening mammogram for malignant neoplasm of breast: Secondary | ICD-10-CM | POA: Insufficient documentation

## 2017-03-03 DIAGNOSIS — M1990 Unspecified osteoarthritis, unspecified site: Secondary | ICD-10-CM | POA: Diagnosis not present

## 2017-03-03 DIAGNOSIS — E7800 Pure hypercholesterolemia, unspecified: Secondary | ICD-10-CM | POA: Diagnosis not present

## 2017-03-03 DIAGNOSIS — I10 Essential (primary) hypertension: Secondary | ICD-10-CM | POA: Diagnosis not present

## 2017-07-09 ENCOUNTER — Encounter: Payer: Self-pay | Admitting: Internal Medicine

## 2017-07-16 ENCOUNTER — Encounter: Payer: Self-pay | Admitting: Internal Medicine

## 2017-07-20 ENCOUNTER — Encounter: Payer: Self-pay | Admitting: Internal Medicine

## 2017-09-13 DIAGNOSIS — R05 Cough: Secondary | ICD-10-CM | POA: Diagnosis not present

## 2017-09-13 DIAGNOSIS — J40 Bronchitis, not specified as acute or chronic: Secondary | ICD-10-CM | POA: Diagnosis not present

## 2017-09-14 ENCOUNTER — Encounter: Payer: Self-pay | Admitting: Internal Medicine

## 2017-09-21 DIAGNOSIS — I10 Essential (primary) hypertension: Secondary | ICD-10-CM | POA: Diagnosis not present

## 2017-09-21 DIAGNOSIS — E559 Vitamin D deficiency, unspecified: Secondary | ICD-10-CM | POA: Diagnosis not present

## 2017-09-21 DIAGNOSIS — E7800 Pure hypercholesterolemia, unspecified: Secondary | ICD-10-CM | POA: Diagnosis not present

## 2017-09-21 DIAGNOSIS — M170 Bilateral primary osteoarthritis of knee: Secondary | ICD-10-CM | POA: Diagnosis not present

## 2017-09-29 ENCOUNTER — Encounter: Payer: 59 | Admitting: Internal Medicine

## 2017-10-28 ENCOUNTER — Encounter: Payer: Self-pay | Admitting: *Deleted

## 2017-11-27 ENCOUNTER — Encounter: Payer: 59 | Admitting: Internal Medicine

## 2018-01-27 DIAGNOSIS — M79672 Pain in left foot: Secondary | ICD-10-CM | POA: Diagnosis not present

## 2018-02-08 DIAGNOSIS — M25579 Pain in unspecified ankle and joints of unspecified foot: Secondary | ICD-10-CM | POA: Diagnosis not present

## 2018-02-08 DIAGNOSIS — M79672 Pain in left foot: Secondary | ICD-10-CM | POA: Diagnosis not present

## 2018-02-23 ENCOUNTER — Encounter: Payer: Self-pay | Admitting: Internal Medicine

## 2018-02-23 DIAGNOSIS — M25579 Pain in unspecified ankle and joints of unspecified foot: Secondary | ICD-10-CM | POA: Diagnosis not present

## 2018-02-23 DIAGNOSIS — M79672 Pain in left foot: Secondary | ICD-10-CM | POA: Diagnosis not present

## 2018-03-10 ENCOUNTER — Other Ambulatory Visit: Payer: Self-pay

## 2018-03-10 ENCOUNTER — Ambulatory Visit (AMBULATORY_SURGERY_CENTER): Payer: Self-pay

## 2018-03-10 ENCOUNTER — Encounter: Payer: Self-pay | Admitting: Internal Medicine

## 2018-03-10 VITALS — Ht 60.0 in | Wt 229.4 lb

## 2018-03-10 DIAGNOSIS — Z800 Family history of malignant neoplasm of digestive organs: Secondary | ICD-10-CM

## 2018-03-10 NOTE — Progress Notes (Signed)
No egg or soy allergy known to patient  No issues with past sedation with any surgeries  or procedures, no intubation problems  No diet pills per patient No home 02 use per patient  No blood thinners per patient  Pt denies issues with constipation  No A fib or A flutter  EMMI video sent to pt's e mail , pt declined    

## 2018-03-24 ENCOUNTER — Ambulatory Visit (AMBULATORY_SURGERY_CENTER): Payer: 59 | Admitting: Internal Medicine

## 2018-03-24 ENCOUNTER — Encounter: Payer: Self-pay | Admitting: Internal Medicine

## 2018-03-24 VITALS — BP 127/64 | HR 76 | Temp 98.6°F | Resp 14 | Ht 60.0 in | Wt 229.0 lb

## 2018-03-24 DIAGNOSIS — Z800 Family history of malignant neoplasm of digestive organs: Secondary | ICD-10-CM

## 2018-03-24 DIAGNOSIS — Z1211 Encounter for screening for malignant neoplasm of colon: Secondary | ICD-10-CM | POA: Diagnosis not present

## 2018-03-24 MED ORDER — SODIUM CHLORIDE 0.9 % IV SOLN: 500.0000 mL | Freq: Once | INTRAVENOUS | Status: DC

## 2018-03-24 NOTE — Progress Notes (Signed)
Pt's states no medical or surgical changes since previsit or office visit. 

## 2018-03-24 NOTE — Patient Instructions (Addendum)
No polyps or cancer seen.  You do still have a condition called diverticulosis - common and not usually a problem. Please read the handout provided.  Your next routine colonoscopy should be in 5 years - 2024.  I appreciate the opportunity to care for you. Gatha Mayer, MD, FACG   YOU HAD AN ENDOSCOPIC PROCEDURE TODAY AT [facility]:   Refer to the procedure report that was given to you for any specific questions about what was found during the examination.  If the procedure report does not answer your questions, please call your gastroenterologist to clarify.  If you requested that your care partner not be given the details of your procedure findings, then the procedure report has been included in a sealed envelope for you to review at your convenience later.  YOU SHOULD EXPECT: Some feelings of bloating in the abdomen. Passage of more gas than usual.  Walking can help get rid of the air that was put into your GI tract during the procedure and reduce the bloating. If you had a lower endoscopy (such as a colonoscopy or flexible sigmoidoscopy) you may notice spotting of blood in your stool or on the toilet paper. If you underwent a bowel prep for your procedure, you may not have a normal bowel movement for a few days.  Please Note:  You might notice some irritation and congestion in your nose or some drainage.  This is from the oxygen used during your procedure.  There is no need for concern and it should clear up in a day or so.  SYMPTOMS TO REPORT IMMEDIATELY:   Following lower endoscopy (colonoscopy or flexible sigmoidoscopy):  Excessive amounts of blood in the stool  Significant tenderness or worsening of abdominal pains  Swelling of the abdomen that is new, acute  Fever of 100F or higher    For urgent or emergent issues, a gastroenterologist can be reached at any hour by calling [phone].   DIET:  We do recommend a small meal at first, but then you  may proceed to your regular diet.  Drink plenty of fluids but you should avoid alcoholic beverages for 24 hours.  ACTIVITY:  You should plan to take it easy for the rest of today and you should NOT DRIVE or use heavy machinery until tomorrow (because of the sedation medicines used during the test).    FOLLOW UP: Our staff will call the number listed on your records the next business day following your procedure to check on you and address any questions or concerns that you may have regarding the information given to you following your procedure. If we do not reach you, we will leave a message.  However, if you are feeling well and you are not experiencing any problems, there is no need to return our call.  We will assume that you have returned to your regular daily activities without incident.  If any biopsies were taken you will be contacted by phone or by letter within the next 1-3 weeks.  Please call us at [phone] if you have not heard about the biopsies in 3 weeks.    SIGNATURES/CONFIDENTIALITY: You and/or your care partner have signed paperwork which will be entered into your electronic medical record.  These signatures attest to the fact that that the information above on your After Visit Summary has been reviewed and is understood.  Full responsibility of the confidentiality of this discharge information lies with you and/or your care-partner.

## 2018-03-24 NOTE — Op Note (Signed)
[facility] Patient Name: Alicia Miranda Procedure Date: 03/24/2018 9:43 AM MRN: [id number] Endoscopist: Gatha Mayer , MD Age: 62 Referring MD:  Date of Birth: [birth year] Gender: Female Account #: [id number] Procedure:                Colonoscopy Indications:              Screening in patient at increased risk: Family                            history of 1st-degree relative with colorectal                            cancer Medicines:                Propofol per Anesthesia, Monitored Anesthesia Care Procedure:                Pre-Anesthesia Assessment:                           - Prior to the procedure, a History and Physical                            was performed, and patient medications and                            allergies were reviewed. The patient's tolerance of                            previous anesthesia was also reviewed. The risks                            and benefits of the procedure and the sedation                            options and risks were discussed with the patient.                            All questions were answered, and informed consent                            was obtained. Prior Anticoagulants: The patient has                            taken no previous anticoagulant or antiplatelet                            agents. ASA Grade Assessment: II - A patient with                            mild systemic disease. After reviewing the risks                            and benefits, the patient was deemed in  satisfactory condition to undergo the procedure.                           After obtaining informed consent, the colonoscope                            was passed under direct vision. Throughout the                            procedure, the patient's blood pressure, pulse, and                            oxygen saturations were monitored continuously. The                            Model CF-HQ190L [id number]) scope was  introduced                            through the anus and advanced to the the cecum,                            identified by appendiceal orifice and ileocecal                            valve. The ileocecal valve, appendiceal orifice,                            and rectum were photographed. The quality of the                            bowel preparation was excellent. The bowel                            preparation used was Miralax. Scope In: 9:50:15 AM Scope Out: 10:05:17 AM Scope Withdrawal Time: 0 hours 11 minutes 54 seconds  Total Procedure Duration: 0 hours 15 minutes 2 seconds  Findings:                 The perianal and digital rectal examinations were                            normal.                           Multiple diverticula were found in the sigmoid                            colon and descending colon. There was narrowing of                            the colon in association with the diverticular                            opening.  The exam was otherwise without abnormality on                            direct and retroflexion views. Complications:            No immediate complications. Estimated blood loss:                            None. Estimated Blood Loss:     Estimated blood loss: none. Impression:               - Moderate diverticulosis in the sigmoid colon and                            in the descending colon. There was narrowing of the                            colon in association with the diverticular opening.                           - The examination was otherwise normal on direct                            and retroflexion views.                           - No specimens collected.                           - Family hx colon cancer - brother dx age 66 Recommendation:           - Repeat colonoscopy in 5 years for screening                            purposes.                           - Resume previous diet.                            - Continue present medications. Gatha Mayer, MD 03/24/2018 10:13:56 AM This report has been signed electronically.

## 2018-03-24 NOTE — Progress Notes (Signed)
A and O x3. Report to RN. Tolerated MAC anesthesia well.

## 2018-03-25 ENCOUNTER — Telehealth: Payer: Self-pay

## 2018-03-25 NOTE — Telephone Encounter (Signed)
  Follow up Call-  Call back number 03/24/2018  Post procedure Call Back phone  # [phone]  Permission to leave phone message Yes  Some recent data might be hidden     Patient questions:  Do you have a fever, pain , or abdominal swelling? No. Pain Score  0 *  Have you tolerated food without any problems? Yes.    Have you been able to return to your normal activities? Yes.    Do you have any questions about your discharge instructions: Diet   No. Medications  No. Follow up visit  No.  Do you have questions or concerns about your Care? No.  Actions: * If pain score is 4 or above: No action needed, pain <4.

## 2018-03-27 ENCOUNTER — Encounter (HOSPITAL_COMMUNITY): Payer: Self-pay | Admitting: Emergency Medicine

## 2018-03-27 ENCOUNTER — Other Ambulatory Visit: Payer: Self-pay

## 2018-03-27 ENCOUNTER — Emergency Department (HOSPITAL_COMMUNITY): Payer: 59

## 2018-03-27 ENCOUNTER — Emergency Department (HOSPITAL_COMMUNITY)
Admission: EM | Admit: 2018-03-27 | Discharge: 2018-03-27 | Disposition: A | Discharge status: Home / Self Care | Payer: 59 | Attending: Emergency Medicine | Admitting: Emergency Medicine

## 2018-03-27 DIAGNOSIS — S0242XA Fracture of alveolus of maxilla, initial encounter for closed fracture: Secondary | ICD-10-CM | POA: Diagnosis not present

## 2018-03-27 DIAGNOSIS — S6991XA Unspecified injury of right wrist, hand and finger(s), initial encounter: Secondary | ICD-10-CM | POA: Diagnosis present

## 2018-03-27 DIAGNOSIS — Y999 Unspecified external cause status: Secondary | ICD-10-CM | POA: Insufficient documentation

## 2018-03-27 DIAGNOSIS — S52501A Unspecified fracture of the lower end of right radius, initial encounter for closed fracture: Secondary | ICD-10-CM | POA: Insufficient documentation

## 2018-03-27 DIAGNOSIS — J45909 Unspecified asthma, uncomplicated: Secondary | ICD-10-CM | POA: Diagnosis not present

## 2018-03-27 DIAGNOSIS — S199XXA Unspecified injury of neck, initial encounter: Secondary | ICD-10-CM | POA: Diagnosis not present

## 2018-03-27 DIAGNOSIS — M542 Cervicalgia: Secondary | ICD-10-CM | POA: Diagnosis not present

## 2018-03-27 DIAGNOSIS — W01198A Fall on same level from slipping, tripping and stumbling with subsequent striking against other object, initial encounter: Secondary | ICD-10-CM | POA: Diagnosis not present

## 2018-03-27 DIAGNOSIS — Y9301 Activity, walking, marching and hiking: Secondary | ICD-10-CM | POA: Insufficient documentation

## 2018-03-27 DIAGNOSIS — Z79899 Other long term (current) drug therapy: Secondary | ICD-10-CM | POA: Insufficient documentation

## 2018-03-27 DIAGNOSIS — S0990XA Unspecified injury of head, initial encounter: Secondary | ICD-10-CM | POA: Diagnosis not present

## 2018-03-27 DIAGNOSIS — S0181XA Laceration without foreign body of other part of head, initial encounter: Secondary | ICD-10-CM | POA: Diagnosis not present

## 2018-03-27 DIAGNOSIS — Y92830 Public park as the place of occurrence of the external cause: Secondary | ICD-10-CM | POA: Diagnosis not present

## 2018-03-27 DIAGNOSIS — I10 Essential (primary) hypertension: Secondary | ICD-10-CM | POA: Insufficient documentation

## 2018-03-27 DIAGNOSIS — S0285XA Fracture of orbit, unspecified, initial encounter for closed fracture: Secondary | ICD-10-CM | POA: Diagnosis not present

## 2018-03-27 DIAGNOSIS — S52591A Other fractures of lower end of right radius, initial encounter for closed fracture: Secondary | ICD-10-CM | POA: Diagnosis not present

## 2018-03-27 DIAGNOSIS — R51 Headache: Secondary | ICD-10-CM | POA: Diagnosis not present

## 2018-03-27 DIAGNOSIS — S0231XA Fracture of orbital floor, right side, initial encounter for closed fracture: Secondary | ICD-10-CM | POA: Diagnosis not present

## 2018-03-27 MED ORDER — LIDOCAINE HCL (PF) 1 % IJ SOLN
5.0000 mL | Freq: Once | INTRAMUSCULAR | Status: AC
  Administered 2018-03-27: 5 mL via INTRADERMAL
  Filled 2018-03-27: qty 5

## 2018-03-27 MED ORDER — TRAMADOL HCL 50 MG PO TABS: 50.0000 mg | ORAL_TABLET | Freq: Four times a day (QID) | ORAL | 0 refills | Status: DC | PRN

## 2018-03-27 MED ORDER — TRAMADOL HCL 50 MG PO TABS: 50.0000 mg | ORAL_TABLET | Freq: Four times a day (QID) | ORAL | 0 refills | Status: AC | PRN

## 2018-03-27 NOTE — ED Triage Notes (Signed)
Pt. Stated, I [place] the park and triped over a little raised area. My sunglasses cut me over rt. Eye and a area under right eye. My rt wrist is also hurting.. This happened 30 min ago.

## 2018-03-27 NOTE — ED Notes (Signed)
Signature pad not available. Pt ambulatory and with no s/sx distress

## 2018-03-27 NOTE — Progress Notes (Signed)
Orthopedic Tech Progress Note Patient Details:  Alicia Miranda 09/19/1955 [id number]  Ortho Devices Type of Ortho Device: Ace wrap, Arm sling, Sugartong splint Ortho Device/Splint Location: rue Ortho Device/Splint Interventions: Application   Post Interventions Patient Tolerated: Well Instructions Provided: Care of device   Hildred Priest 03/27/2018, 6:17 PM

## 2018-03-27 NOTE — Discharge Instructions (Signed)
Provided medication for your pain please take for severe pain.  I have also given you a referral to the ENT who will see you for your right eye fracture, I have also given you the referral for orthopedist will see you for your right hand fracture.  Please continue to elevate the head of the bed while you sleep and apply ice to the right eye.  If you experience any pain with eye movement, restriction in eye movement please return to the ED for reevaluation.

## 2018-03-27 NOTE — ED Provider Notes (Signed)
[facility] EMERGENCY DEPARTMENT Provider Note   CSN: [id number] Arrival date & time: 03/27/18  1321     History   Chief Complaint Chief Complaint  Patient presents with  . Facial Laceration  . Wrist Pain    HPI Alicia Miranda is a 62 y.o. female.  62 y/o female with a PMH of HTN, Hyperlipidemia presents to the ED s/p mechanical fall.  Reports she was with her husband at the parking [facility] when there was on uneven break and she mechanical fall landing on the right side of her face trying to catch herself with her right hand.  Reports pain along the right side of her face along with bleeding from her right nostril.  She also reports right wrist pain which is worse with movement and touch.  Has not tried any medical therapy for relief but reports the pain on her wrist is a 10 out of 10 with movement.  Also reports some right knee pain as she hit her knee falling on the ground.  She denies hitting her head or LOC or blood thinner use.  Also denies shortness of breath, headache, chest pain.     Past Medical History:  Diagnosis Date  . Allergy   . Arthritis    legs  . Asthma   . GERD (gastroesophageal reflux disease)   . Hyperlipidemia   . Hypertension     There are no active problems to display for this patient.   Past Surgical History:  Procedure Laterality Date  . ABDOMINAL HYSTERECTOMY    . CHOLECYSTECTOMY    . COLONOSCOPY    . UPPER GASTROINTESTINAL ENDOSCOPY       OB History   None      Home Medications    Prior to Admission medications   Medication Sig Start Date End Date Taking? Authorizing Provider  APPLE CIDER VINEGAR PO Take 1 tablet by mouth daily.    [provider]  CINNAMON PO Take 1,000 mg by mouth daily.    [provider]  docusate sodium (COLACE) 100 MG capsule Take 100 mg by mouth daily.    [provider]  furosemide (LASIX) 20 MG tablet 40 mg daily.  03/12/12   [provider]  Ginger,  Zingiber officinalis, (GINGER ROOT PO) Take 550 mg by mouth daily.    [provider]  losartan (COZAAR) 50 MG tablet Take 100 mg by mouth once.  04/22/12   [provider]  Melatonin 5 MG CAPS Take 5 mg by mouth at bedtime.    [provider]  Misc Natural Products (OSTEO BI-FLEX JOINT SHIELD PO) Take 1 tablet by mouth daily.    [provider]  pantoprazole (PROTONIX) 20 MG tablet Take 20 mg by mouth daily. May take 1 to 2 tablets daily    [provider]  rosuvastatin (CRESTOR) 20 MG tablet Take 20 mg by mouth daily. Takes 0.5 tablet daily    [provider]  Specialty Vitamins Products ([place] METABOLISM PO) Take by mouth.    [provider]  traMADol (ULTRAM) 50 MG tablet Take 1 tablet (50 mg total) by mouth every 6 (six) hours as needed for up to 3 days for severe pain. 03/27/18 03/30/18  Janeece Fitting, PA-C  TURMERIC PO Take 100 mg by mouth.    [provider]    Family History Family History  Problem Relation Age of Onset  . Colon cancer Brother  dx at 54  . Esophageal cancer Neg Hx   . Rectal cancer Neg Hx   . Stomach cancer Neg Hx     Social History Social History   Tobacco Use  . Smoking status: Former Research scientist (life sciences)  . Smokeless tobacco: Never Used  . Tobacco comment: quit smoking in 2002 or 2003  Substance Use Topics  . Alcohol use: Yes    Comment: very rarely  . Drug use: No     Allergies   Codeine   Review of Systems Review of Systems  Constitutional: Negative for chills and fever.  HENT: Negative for sore throat.   Respiratory: Negative for chest tightness and shortness of breath.   Cardiovascular: Negative for chest pain and palpitations.  Gastrointestinal: Negative for abdominal pain, diarrhea, nausea and vomiting.  Genitourinary: Negative for dysuria and flank pain.  Musculoskeletal: Positive for arthralgias and myalgias. Negative for back pain.  Skin: Positive for wound.  Negative for pallor.  Neurological: Negative for syncope, light-headedness and headaches.  All other systems reviewed and are negative.    Physical Exam Updated Vital Signs BP (!) 147/84   Pulse 77   Temp 99.3 F (37.4 C) (Oral)   Resp 16   Ht 5' (1.524 m)   Wt 103.8 kg   SpO2 97%   BMI 44.69 kg/m   Physical Exam  Constitutional: She is oriented to person, place, and time. She appears well-developed.  HENT:  Head: Normocephalic. Head is with laceration and with right periorbital erythema. Head is without raccoon's eyes (right sided ) and without Battle's sign.    Nose: No septal deviation. Epistaxis is observed.    Right periorbital edema, color changes noted along lower orbital region.   Neck: Normal range of motion. Neck supple.  Cardiovascular: Normal heart sounds.  Pulmonary/Chest: Effort normal and breath sounds normal. She exhibits no tenderness.  Abdominal: Soft. There is no tenderness.  Musculoskeletal: She exhibits tenderness.       Right knee: She exhibits swelling. She exhibits no deformity and no erythema.       Right hand: She exhibits tenderness. Normal sensation noted. Normal strength noted. She exhibits no finger abduction, no thumb/finger opposition and no wrist extension trouble.       Hands:      Legs: Neurological: She is alert and oriented to person, place, and time.  Skin: Skin is warm and dry.  Nursing note and vitals reviewed.      ED Treatments / Results  Labs (all labs ordered are listed, but only abnormal results are displayed) Labs Reviewed - No data to display  EKG None  Radiology Dg Wrist Complete Right  Result Date: 03/27/2018 CLINICAL DATA:  Recent fall with wrist pain, initial encounter EXAM: RIGHT WRIST - COMPLETE 3+ VIEW COMPARISON:  None. FINDINGS: Minimally displaced fracture in the distal radius laterally is seen. No other fractures are noted. No other focal abnormality is noted. IMPRESSION: Minimally displaced distal  radial fracture Electronically Signed   By: Inez Catalina M.D.   On: 03/27/2018 15:25   Dg Knee 2 Views Right  Result Date: 03/27/2018 CLINICAL DATA:  Recent trip and fall with knee pain, initial encounter EXAM: RIGHT KNEE - 1-2 VIEW COMPARISON:  12/24/2012 FINDINGS: Degenerative changes are noted in the medial joint space with joint space narrowing and significant osteophytic changes. Patellofemoral degenerative changes are noted as well. No acute fracture is seen. No soft tissue changes are noted. IMPRESSION: Degenerative change without acute abnormality. Electronically Signed   By:  Inez Catalina M.D.   On: 03/27/2018 15:26   Ct Head Wo Contrast  Result Date: 03/27/2018 CLINICAL DATA:  Tripped and fell. Head pain, neck pain, face pain. Facial swelling. EXAM: CT HEAD WITHOUT CONTRAST CT MAXILLOFACIAL WITHOUT CONTRAST CT CERVICAL SPINE WITHOUT CONTRAST TECHNIQUE: Multidetector CT imaging of the head, cervical spine, and maxillofacial structures were performed using the standard protocol without intravenous contrast. Multiplanar CT image reconstructions of the cervical spine and maxillofacial structures were also generated. COMPARISON:  None. FINDINGS: CT HEAD FINDINGS Brain: No evidence of acute infarction, hemorrhage, hydrocephalus, extra-axial collection or mass lesion/mass effect. Normal cerebral volume. Possible mild white matter disease. Vascular: No hyperdense vessel or unexpected calcification. Skull: Normal. Negative for fracture or focal lesion. Other: None. CT MAXILLOFACIAL FINDINGS Osseous: Inferior RIGHT orbital blowout fracture. Marked trap door depression of the orbital floor, with a 13 mm defect. Globe grossly intact. No nasal bone fracture. Minimal RIGHT lateral maxillary wall fracture. Mandible intact. TMJs located. Orbits: Herniation of orbital fat into the RIGHT maxillary sinus, displaced downward 14 mm. No extraocular muscle entrapment. Marked orbital emphysema. No retrobulbar hemorrhage.  Globe intact. Sinuses: RIGHT maxillary hemosinus. Minor RIGHT ethmoid hemorrhage. Frontal sinuses hypoplastic. Sphenoid sinus centrally clear. Soft tissues: Marked RIGHT facial soft tissue swelling. CT CERVICAL SPINE FINDINGS Alignment: Normal. Skull base and vertebrae: No acute fracture. No primary bone lesion or focal pathologic process. Soft tissues and spinal canal: No prevertebral fluid or swelling. No visible canal hematoma. Disc levels: No significant disc space narrowing or calcified protrusion. Upper chest: No pneumothorax or upper rib fracture. Other: None. IMPRESSION: No skull fracture or intracranial hemorrhage. No cervical spine fracture or traumatic subluxation. Significant RIGHT inferior orbital blowout fracture, with a large orbital floor defect, RIGHT maxillary hemosinus, marked orbital emphysema, as well as downward herniation of orbital fat as much as 14 mm. Ophthalmologic consultation is warranted. Electronically Signed   By: Staci Righter M.D.   On: 03/27/2018 15:46   Ct Cervical Spine Wo Contrast  Result Date: 03/27/2018 CLINICAL DATA:  Tripped and fell. Head pain, neck pain, face pain. Facial swelling. EXAM: CT HEAD WITHOUT CONTRAST CT MAXILLOFACIAL WITHOUT CONTRAST CT CERVICAL SPINE WITHOUT CONTRAST TECHNIQUE: Multidetector CT imaging of the head, cervical spine, and maxillofacial structures were performed using the standard protocol without intravenous contrast. Multiplanar CT image reconstructions of the cervical spine and maxillofacial structures were also generated. COMPARISON:  None. FINDINGS: CT HEAD FINDINGS Brain: No evidence of acute infarction, hemorrhage, hydrocephalus, extra-axial collection or mass lesion/mass effect. Normal cerebral volume. Possible mild white matter disease. Vascular: No hyperdense vessel or unexpected calcification. Skull: Normal. Negative for fracture or focal lesion. Other: None. CT MAXILLOFACIAL FINDINGS Osseous: Inferior RIGHT orbital blowout  fracture. Marked trap door depression of the orbital floor, with a 13 mm defect. Globe grossly intact. No nasal bone fracture. Minimal RIGHT lateral maxillary wall fracture. Mandible intact. TMJs located. Orbits: Herniation of orbital fat into the RIGHT maxillary sinus, displaced downward 14 mm. No extraocular muscle entrapment. Marked orbital emphysema. No retrobulbar hemorrhage. Globe intact. Sinuses: RIGHT maxillary hemosinus. Minor RIGHT ethmoid hemorrhage. Frontal sinuses hypoplastic. Sphenoid sinus centrally clear. Soft tissues: Marked RIGHT facial soft tissue swelling. CT CERVICAL SPINE FINDINGS Alignment: Normal. Skull base and vertebrae: No acute fracture. No primary bone lesion or focal pathologic process. Soft tissues and spinal canal: No prevertebral fluid or swelling. No visible canal hematoma. Disc levels: No significant disc space narrowing or calcified protrusion. Upper chest: No pneumothorax or upper rib fracture. Other: None. IMPRESSION:  No skull fracture or intracranial hemorrhage. No cervical spine fracture or traumatic subluxation. Significant RIGHT inferior orbital blowout fracture, with a large orbital floor defect, RIGHT maxillary hemosinus, marked orbital emphysema, as well as downward herniation of orbital fat as much as 14 mm. Ophthalmologic consultation is warranted. Electronically Signed   By: Staci Righter M.D.   On: 03/27/2018 15:46   Ct Maxillofacial Wo Contrast  Result Date: 03/27/2018 CLINICAL DATA:  Tripped and fell. Head pain, neck pain, face pain. Facial swelling. EXAM: CT HEAD WITHOUT CONTRAST CT MAXILLOFACIAL WITHOUT CONTRAST CT CERVICAL SPINE WITHOUT CONTRAST TECHNIQUE: Multidetector CT imaging of the head, cervical spine, and maxillofacial structures were performed using the standard protocol without intravenous contrast. Multiplanar CT image reconstructions of the cervical spine and maxillofacial structures were also generated. COMPARISON:  None. FINDINGS: CT HEAD  FINDINGS Brain: No evidence of acute infarction, hemorrhage, hydrocephalus, extra-axial collection or mass lesion/mass effect. Normal cerebral volume. Possible mild white matter disease. Vascular: No hyperdense vessel or unexpected calcification. Skull: Normal. Negative for fracture or focal lesion. Other: None. CT MAXILLOFACIAL FINDINGS Osseous: Inferior RIGHT orbital blowout fracture. Marked trap door depression of the orbital floor, with a 13 mm defect. Globe grossly intact. No nasal bone fracture. Minimal RIGHT lateral maxillary wall fracture. Mandible intact. TMJs located. Orbits: Herniation of orbital fat into the RIGHT maxillary sinus, displaced downward 14 mm. No extraocular muscle entrapment. Marked orbital emphysema. No retrobulbar hemorrhage. Globe intact. Sinuses: RIGHT maxillary hemosinus. Minor RIGHT ethmoid hemorrhage. Frontal sinuses hypoplastic. Sphenoid sinus centrally clear. Soft tissues: Marked RIGHT facial soft tissue swelling. CT CERVICAL SPINE FINDINGS Alignment: Normal. Skull base and vertebrae: No acute fracture. No primary bone lesion or focal pathologic process. Soft tissues and spinal canal: No prevertebral fluid or swelling. No visible canal hematoma. Disc levels: No significant disc space narrowing or calcified protrusion. Upper chest: No pneumothorax or upper rib fracture. Other: None. IMPRESSION: No skull fracture or intracranial hemorrhage. No cervical spine fracture or traumatic subluxation. Significant RIGHT inferior orbital blowout fracture, with a large orbital floor defect, RIGHT maxillary hemosinus, marked orbital emphysema, as well as downward herniation of orbital fat as much as 14 mm. Ophthalmologic consultation is warranted. Electronically Signed   By: Staci Righter M.D.   On: 03/27/2018 15:46    Procedures .Marland KitchenLaceration Repair Date/Time: 03/27/2018 6:46 PM Performed by: Janeece Fitting, PA-C Authorized by: Janeece Fitting, PA-C   Consent:    Consent obtained:  Verbal    Consent given by:  Patient   Risks discussed:  Infection, need for additional repair, pain, poor cosmetic result and poor wound healing   Alternatives discussed:  No treatment and delayed treatment Universal protocol:    Procedure explained and questions answered to patient or proxy's satisfaction: yes     Relevant documents present and verified: yes     Test results available and properly labeled: yes     Imaging studies available: yes     Required blood products, implants, devices, and special equipment available: yes     Site/side marked: yes     Immediately prior to procedure, a time out was called: yes     Patient identity confirmed:  Verbally with patient Anesthesia (see MAR for exact dosages):    Anesthesia method:  Local infiltration   Local anesthetic:  Lidocaine 1% w/o epi Laceration details:    Location:  Face   Face location:  R eyebrow   Length (cm):  4   Depth (mm):  1 Repair type:  Repair type:  Simple Pre-procedure details:    Preparation:  Patient was prepped and draped in usual sterile fashion Exploration:    Hemostasis achieved with:  Direct pressure   Wound exploration: wound explored through full range of motion and entire depth of wound probed and visualized   Treatment:    Area cleansed with:  Saline   Amount of cleaning:  Extensive   Irrigation solution:  Sterile saline   Irrigation method:  Syringe Skin repair:    Repair method:  Sutures   Suture size:  6-0   Suture material:  Prolene   Suture technique:  Simple interrupted   Number of sutures:  4 Approximation:    Approximation:  Close Post-procedure details:    Dressing:  Open (no dressing)   Patient tolerance of procedure:  Tolerated well, no immediate complications   (including critical care time)  Medications Ordered in ED Medications  lidocaine (PF) (XYLOCAINE) 1 % injection 5 mL (5 mLs Intradermal Given 03/27/18 1527)     Initial Impression / Assessment and Plan / ED Course  I  have reviewed the triage vital signs and the nursing notes.  Pertinent labs & imaging results that were available during my care of the patient were reviewed by me and considered in my medical decision making (see chart for details).    Presents after [place] the park while stepping on some uneven brick.  Thing in ecchymosis and periorbital edema showing to the right side, along with a laceration.  CT head along with maxillofacial and C-spine order to rule out any acute fracture or abnormality.  CT head normal with no skull fracture or intracranial hemorrhage, C-spine normal also with no fracture or traumatic subluxation.  CT maxillofacial shows significant right inferior orbital blowout fracture with a large orbital floor defect, right maxillary him with sinus, marked orbital emphysema, as well as downward herniation of orbital fat as much as 14 mm.  We will place a call out to ophthalmology Dr. Posey Pronto for his recommendations.  Patient was order for her wrist and knee as she states she try to brace herself on her fall with her right hand along with her right knee.  DG right wrist show a minimally displaced distal radial fracture,will place patient on sugar tong splint.   4:42 PM Spoke to Dr. Posey Pronto who advised patient is to be seen by an oculoplastic specialist, he recommends me a call to ENT to see if this is a patient that they could actually handle will consult ENT prior to affecting [facility] or [facility].  5:28 PM Called placed for Dr. Constance Holster ENT, reports she will see patient in office this week I will provide patient with his office number and address. I have personally closed her right eyebrow laceration with 4 sutures 6-0 Prolene.  Patient requesting pain medication to go home with states she does well with tramadol will provide her with tramadol prescription for 3 days in order to help with her symptoms, patient is advised to elevate her head, apply ice to her right eye and follow-up  with specialist for her fractures.  Return precautions provided.  Final Clinical Impressions(s) / ED Diagnoses   Final diagnoses:  Closed fracture of distal end of right radius, unspecified fracture morphology, initial encounter  Facial laceration, initial encounter  Closed fracture of alveolar socket wall of maxilla, initial encounter [facility])  Orbital fracture, closed, initial encounter    ED Discharge Orders  Ordered    traMADol (ULTRAM) 50 MG tablet  Every 6 hours PRN     03/27/18 1852           Janeece Fitting, Hershal Coria 03/27/18 1854    Jola Schmidt, MD 03/28/18 1753

## 2018-03-31 DIAGNOSIS — S52571A Other intraarticular fracture of lower end of right radius, initial encounter for closed fracture: Secondary | ICD-10-CM | POA: Diagnosis not present

## 2018-03-31 DIAGNOSIS — Z6841 Body Mass Index (BMI) 40.0 and over, adult: Secondary | ICD-10-CM | POA: Diagnosis not present

## 2018-04-01 DIAGNOSIS — S0285XD Fracture of orbit, unspecified, subsequent encounter for fracture with routine healing: Secondary | ICD-10-CM | POA: Diagnosis not present

## 2018-04-02 DIAGNOSIS — S0181XD Laceration without foreign body of other part of head, subsequent encounter: Secondary | ICD-10-CM | POA: Diagnosis not present

## 2018-04-02 DIAGNOSIS — S52501D Unspecified fracture of the lower end of right radius, subsequent encounter for closed fracture with routine healing: Secondary | ICD-10-CM | POA: Diagnosis not present

## 2018-04-02 DIAGNOSIS — S0231XD Fracture of orbital floor, right side, subsequent encounter for fracture with routine healing: Secondary | ICD-10-CM | POA: Diagnosis not present

## 2018-04-08 DIAGNOSIS — S52571A Other intraarticular fracture of lower end of right radius, initial encounter for closed fracture: Secondary | ICD-10-CM | POA: Diagnosis not present

## 2018-05-03 DIAGNOSIS — Z0000 Encounter for general adult medical examination without abnormal findings: Secondary | ICD-10-CM | POA: Diagnosis not present

## 2018-05-03 DIAGNOSIS — E559 Vitamin D deficiency, unspecified: Secondary | ICD-10-CM | POA: Diagnosis not present

## 2018-05-03 DIAGNOSIS — I10 Essential (primary) hypertension: Secondary | ICD-10-CM | POA: Diagnosis not present

## 2018-05-03 DIAGNOSIS — J209 Acute bronchitis, unspecified: Secondary | ICD-10-CM | POA: Diagnosis not present

## 2018-05-03 DIAGNOSIS — E7800 Pure hypercholesterolemia, unspecified: Secondary | ICD-10-CM | POA: Diagnosis not present

## 2018-05-11 DIAGNOSIS — S52571A Other intraarticular fracture of lower end of right radius, initial encounter for closed fracture: Secondary | ICD-10-CM | POA: Diagnosis not present

## 2019-05-09 ENCOUNTER — Other Ambulatory Visit (HOSPITAL_COMMUNITY): Payer: Self-pay | Admitting: Family Medicine

## 2019-05-09 DIAGNOSIS — Z1231 Encounter for screening mammogram for malignant neoplasm of breast: Secondary | ICD-10-CM

## 2019-06-01 ENCOUNTER — Ambulatory Visit (HOSPITAL_COMMUNITY)
Admission: RE | Admit: 2019-06-01 | Discharge: 2019-06-01 | Disposition: A | Discharge status: Home / Self Care | Payer: 59 | Source: Ambulatory Visit | Attending: Family Medicine | Admitting: Family Medicine

## 2019-06-01 ENCOUNTER — Other Ambulatory Visit: Payer: Self-pay

## 2019-06-01 DIAGNOSIS — Z1231 Encounter for screening mammogram for malignant neoplasm of breast: Secondary | ICD-10-CM

## 2019-06-07 ENCOUNTER — Other Ambulatory Visit (HOSPITAL_COMMUNITY): Payer: Self-pay | Admitting: Family Medicine

## 2019-06-07 DIAGNOSIS — R928 Other abnormal and inconclusive findings on diagnostic imaging of breast: Secondary | ICD-10-CM

## 2019-06-21 ENCOUNTER — Other Ambulatory Visit: Payer: Self-pay

## 2019-06-21 ENCOUNTER — Ambulatory Visit (HOSPITAL_COMMUNITY)
Admission: RE | Admit: 2019-06-21 | Discharge: 2019-06-21 | Disposition: A | Discharge status: Home / Self Care | Payer: 59 | Source: Ambulatory Visit | Attending: Family Medicine | Admitting: Family Medicine

## 2019-06-21 ENCOUNTER — Ambulatory Visit (HOSPITAL_COMMUNITY): Admission: RE | Admit: 2019-06-21 | Payer: 59 | Source: Ambulatory Visit

## 2019-06-21 DIAGNOSIS — R928 Other abnormal and inconclusive findings on diagnostic imaging of breast: Secondary | ICD-10-CM | POA: Diagnosis present

## 2019-06-28 ENCOUNTER — Other Ambulatory Visit (HOSPITAL_COMMUNITY): Payer: 59

## 2019-06-28 ENCOUNTER — Encounter (HOSPITAL_COMMUNITY): Payer: 59

## 2020-02-16 DIAGNOSIS — M1990 Unspecified osteoarthritis, unspecified site: Secondary | ICD-10-CM | POA: Diagnosis not present

## 2020-02-16 DIAGNOSIS — E785 Hyperlipidemia, unspecified: Secondary | ICD-10-CM | POA: Diagnosis not present

## 2020-02-16 DIAGNOSIS — R600 Localized edema: Secondary | ICD-10-CM | POA: Diagnosis not present

## 2020-02-16 DIAGNOSIS — Z791 Long term (current) use of non-steroidal anti-inflammatories (NSAID): Secondary | ICD-10-CM | POA: Diagnosis not present

## 2020-02-16 DIAGNOSIS — I10 Essential (primary) hypertension: Secondary | ICD-10-CM | POA: Diagnosis not present

## 2020-02-16 DIAGNOSIS — G8929 Other chronic pain: Secondary | ICD-10-CM | POA: Diagnosis not present

## 2020-02-16 DIAGNOSIS — J309 Allergic rhinitis, unspecified: Secondary | ICD-10-CM | POA: Diagnosis not present

## 2020-02-16 DIAGNOSIS — Z6841 Body Mass Index (BMI) 40.0 and over, adult: Secondary | ICD-10-CM | POA: Diagnosis not present

## 2020-02-16 DIAGNOSIS — K219 Gastro-esophageal reflux disease without esophagitis: Secondary | ICD-10-CM | POA: Diagnosis not present

## 2020-03-18 DIAGNOSIS — Z20822 Contact with and (suspected) exposure to covid-19: Secondary | ICD-10-CM | POA: Diagnosis not present

## 2020-03-18 DIAGNOSIS — W1839XA Other fall on same level, initial encounter: Secondary | ICD-10-CM | POA: Diagnosis not present

## 2020-03-18 DIAGNOSIS — S1980XA Other specified injuries of unspecified part of neck, initial encounter: Secondary | ICD-10-CM | POA: Diagnosis not present

## 2020-03-18 DIAGNOSIS — W1830XA Fall on same level, unspecified, initial encounter: Secondary | ICD-10-CM | POA: Diagnosis not present

## 2020-03-18 DIAGNOSIS — Y998 Other external cause status: Secondary | ICD-10-CM | POA: Diagnosis not present

## 2020-03-18 DIAGNOSIS — Z87891 Personal history of nicotine dependence: Secondary | ICD-10-CM | POA: Diagnosis not present

## 2020-03-18 DIAGNOSIS — M2578 Osteophyte, vertebrae: Secondary | ICD-10-CM | POA: Diagnosis not present

## 2020-03-18 DIAGNOSIS — S066X9A Traumatic subarachnoid hemorrhage with loss of consciousness of unspecified duration, initial encounter: Secondary | ICD-10-CM | POA: Diagnosis not present

## 2020-03-18 DIAGNOSIS — W19XXXA Unspecified fall, initial encounter: Secondary | ICD-10-CM | POA: Diagnosis not present

## 2020-03-18 DIAGNOSIS — S066X0A Traumatic subarachnoid hemorrhage without loss of consciousness, initial encounter: Secondary | ICD-10-CM | POA: Diagnosis not present

## 2020-03-18 DIAGNOSIS — Z7289 Other problems related to lifestyle: Secondary | ICD-10-CM | POA: Diagnosis not present

## 2020-03-18 DIAGNOSIS — S0101XA Laceration without foreign body of scalp, initial encounter: Secondary | ICD-10-CM | POA: Diagnosis not present

## 2020-03-18 DIAGNOSIS — Z885 Allergy status to narcotic agent status: Secondary | ICD-10-CM | POA: Diagnosis not present

## 2020-03-18 DIAGNOSIS — R40241 Glasgow coma scale score 13-15, unspecified time: Secondary | ICD-10-CM | POA: Diagnosis not present

## 2020-03-18 DIAGNOSIS — M47812 Spondylosis without myelopathy or radiculopathy, cervical region: Secondary | ICD-10-CM | POA: Diagnosis not present

## 2020-03-18 DIAGNOSIS — R402412 Glasgow coma scale score 13-15, at arrival to emergency department: Secondary | ICD-10-CM | POA: Diagnosis not present

## 2020-03-27 DIAGNOSIS — Z87898 Personal history of other specified conditions: Secondary | ICD-10-CM | POA: Diagnosis not present

## 2020-03-27 DIAGNOSIS — K219 Gastro-esophageal reflux disease without esophagitis: Secondary | ICD-10-CM | POA: Diagnosis not present

## 2020-03-27 DIAGNOSIS — M1990 Unspecified osteoarthritis, unspecified site: Secondary | ICD-10-CM | POA: Diagnosis not present

## 2020-03-27 DIAGNOSIS — R609 Edema, unspecified: Secondary | ICD-10-CM | POA: Diagnosis not present

## 2020-03-27 DIAGNOSIS — J45909 Unspecified asthma, uncomplicated: Secondary | ICD-10-CM | POA: Diagnosis not present

## 2020-03-27 DIAGNOSIS — Z4802 Encounter for removal of sutures: Secondary | ICD-10-CM | POA: Diagnosis not present

## 2020-03-27 DIAGNOSIS — I10 Essential (primary) hypertension: Secondary | ICD-10-CM | POA: Diagnosis not present

## 2020-03-27 DIAGNOSIS — E559 Vitamin D deficiency, unspecified: Secondary | ICD-10-CM | POA: Diagnosis not present

## 2020-03-27 DIAGNOSIS — J309 Allergic rhinitis, unspecified: Secondary | ICD-10-CM | POA: Diagnosis not present

## 2020-03-27 DIAGNOSIS — E7800 Pure hypercholesterolemia, unspecified: Secondary | ICD-10-CM | POA: Diagnosis not present

## 2020-04-04 DIAGNOSIS — S0101XD Laceration without foreign body of scalp, subsequent encounter: Secondary | ICD-10-CM | POA: Diagnosis not present

## 2020-04-04 DIAGNOSIS — W19XXXD Unspecified fall, subsequent encounter: Secondary | ICD-10-CM | POA: Diagnosis not present

## 2020-04-04 DIAGNOSIS — S06369A Traumatic hemorrhage of cerebrum, unspecified, with loss of consciousness of unspecified duration, initial encounter: Secondary | ICD-10-CM | POA: Diagnosis not present

## 2020-04-04 DIAGNOSIS — S066X9D Traumatic subarachnoid hemorrhage with loss of consciousness of unspecified duration, subsequent encounter: Secondary | ICD-10-CM | POA: Diagnosis not present

## 2020-04-04 DIAGNOSIS — S06369D Traumatic hemorrhage of cerebrum, unspecified, with loss of consciousness of unspecified duration, subsequent encounter: Secondary | ICD-10-CM | POA: Diagnosis not present

## 2020-04-17 ENCOUNTER — Other Ambulatory Visit (HOSPITAL_COMMUNITY): Payer: Self-pay | Admitting: Physician Assistant

## 2020-04-17 ENCOUNTER — Other Ambulatory Visit (HOSPITAL_COMMUNITY): Payer: Self-pay | Admitting: Family Medicine

## 2020-04-17 DIAGNOSIS — Z1231 Encounter for screening mammogram for malignant neoplasm of breast: Secondary | ICD-10-CM

## 2020-05-28 DIAGNOSIS — I10 Essential (primary) hypertension: Secondary | ICD-10-CM | POA: Diagnosis not present

## 2020-05-28 DIAGNOSIS — J45909 Unspecified asthma, uncomplicated: Secondary | ICD-10-CM | POA: Diagnosis not present

## 2020-05-28 DIAGNOSIS — E7800 Pure hypercholesterolemia, unspecified: Secondary | ICD-10-CM | POA: Diagnosis not present

## 2020-05-28 DIAGNOSIS — Z0000 Encounter for general adult medical examination without abnormal findings: Secondary | ICD-10-CM | POA: Diagnosis not present

## 2020-05-28 DIAGNOSIS — K219 Gastro-esophageal reflux disease without esophagitis: Secondary | ICD-10-CM | POA: Diagnosis not present

## 2020-05-28 DIAGNOSIS — R609 Edema, unspecified: Secondary | ICD-10-CM | POA: Diagnosis not present

## 2020-05-28 DIAGNOSIS — Z1159 Encounter for screening for other viral diseases: Secondary | ICD-10-CM | POA: Diagnosis not present

## 2020-05-28 DIAGNOSIS — M1990 Unspecified osteoarthritis, unspecified site: Secondary | ICD-10-CM | POA: Diagnosis not present

## 2020-05-28 DIAGNOSIS — E2839 Other primary ovarian failure: Secondary | ICD-10-CM | POA: Diagnosis not present

## 2020-05-30 ENCOUNTER — Other Ambulatory Visit (HOSPITAL_COMMUNITY): Payer: Self-pay | Admitting: Family Medicine

## 2020-05-30 DIAGNOSIS — E2839 Other primary ovarian failure: Secondary | ICD-10-CM

## 2020-06-25 ENCOUNTER — Ambulatory Visit (HOSPITAL_COMMUNITY): Payer: 59

## 2020-06-25 ENCOUNTER — Inpatient Hospital Stay (HOSPITAL_COMMUNITY): Admission: RE | Admit: 2020-06-25 | Payer: 59 | Source: Ambulatory Visit

## 2020-07-04 DIAGNOSIS — D649 Anemia, unspecified: Secondary | ICD-10-CM | POA: Diagnosis not present

## 2020-07-30 ENCOUNTER — Ambulatory Visit (HOSPITAL_COMMUNITY)
Admission: RE | Admit: 2020-07-30 | Discharge: 2020-07-30 | Disposition: A | Discharge status: Home / Self Care | Payer: Medicare HMO | Source: Ambulatory Visit | Attending: Family Medicine | Admitting: Family Medicine

## 2020-07-30 ENCOUNTER — Other Ambulatory Visit: Payer: Self-pay

## 2020-07-30 DIAGNOSIS — Z780 Asymptomatic menopausal state: Secondary | ICD-10-CM | POA: Diagnosis not present

## 2020-07-30 DIAGNOSIS — Z1231 Encounter for screening mammogram for malignant neoplasm of breast: Secondary | ICD-10-CM | POA: Diagnosis not present

## 2020-07-30 DIAGNOSIS — E2839 Other primary ovarian failure: Secondary | ICD-10-CM | POA: Insufficient documentation

## 2020-07-30 DIAGNOSIS — M810 Age-related osteoporosis without current pathological fracture: Secondary | ICD-10-CM | POA: Diagnosis not present

## 2020-08-07 DIAGNOSIS — M170 Bilateral primary osteoarthritis of knee: Secondary | ICD-10-CM | POA: Diagnosis not present

## 2020-08-07 DIAGNOSIS — M810 Age-related osteoporosis without current pathological fracture: Secondary | ICD-10-CM | POA: Diagnosis not present

## 2020-08-09 DIAGNOSIS — M170 Bilateral primary osteoarthritis of knee: Secondary | ICD-10-CM | POA: Diagnosis not present

## 2020-08-22 DIAGNOSIS — M810 Age-related osteoporosis without current pathological fracture: Secondary | ICD-10-CM | POA: Diagnosis not present

## 2020-10-26 ENCOUNTER — Ambulatory Visit
Admission: RE | Admit: 2020-10-26 | Discharge: 2020-10-26 | Disposition: A | Discharge status: Home / Self Care | Payer: Medicare HMO | Source: Ambulatory Visit | Attending: Family Medicine | Admitting: Family Medicine

## 2020-10-26 ENCOUNTER — Other Ambulatory Visit: Payer: Self-pay | Admitting: Family Medicine

## 2020-10-26 DIAGNOSIS — I10 Essential (primary) hypertension: Secondary | ICD-10-CM | POA: Diagnosis not present

## 2020-10-26 DIAGNOSIS — M810 Age-related osteoporosis without current pathological fracture: Secondary | ICD-10-CM | POA: Diagnosis not present

## 2020-10-26 DIAGNOSIS — M542 Cervicalgia: Secondary | ICD-10-CM | POA: Diagnosis not present

## 2020-10-26 DIAGNOSIS — M170 Bilateral primary osteoarthritis of knee: Secondary | ICD-10-CM | POA: Diagnosis not present

## 2020-11-16 DIAGNOSIS — J309 Allergic rhinitis, unspecified: Secondary | ICD-10-CM | POA: Diagnosis not present

## 2020-11-16 DIAGNOSIS — Z20828 Contact with and (suspected) exposure to other viral communicable diseases: Secondary | ICD-10-CM | POA: Diagnosis not present

## 2020-11-22 DIAGNOSIS — Z03818 Encounter for observation for suspected exposure to other biological agents ruled out: Secondary | ICD-10-CM | POA: Diagnosis not present

## 2020-11-22 DIAGNOSIS — R059 Cough, unspecified: Secondary | ICD-10-CM | POA: Diagnosis not present

## 2020-11-22 DIAGNOSIS — J018 Other acute sinusitis: Secondary | ICD-10-CM | POA: Diagnosis not present

## 2020-11-26 DIAGNOSIS — E7800 Pure hypercholesterolemia, unspecified: Secondary | ICD-10-CM | POA: Diagnosis not present

## 2020-11-26 DIAGNOSIS — M1990 Unspecified osteoarthritis, unspecified site: Secondary | ICD-10-CM | POA: Diagnosis not present

## 2020-11-26 DIAGNOSIS — J309 Allergic rhinitis, unspecified: Secondary | ICD-10-CM | POA: Diagnosis not present

## 2020-11-26 DIAGNOSIS — R609 Edema, unspecified: Secondary | ICD-10-CM | POA: Diagnosis not present

## 2020-11-26 DIAGNOSIS — J45909 Unspecified asthma, uncomplicated: Secondary | ICD-10-CM | POA: Diagnosis not present

## 2020-11-26 DIAGNOSIS — I10 Essential (primary) hypertension: Secondary | ICD-10-CM | POA: Diagnosis not present

## 2020-11-26 DIAGNOSIS — J018 Other acute sinusitis: Secondary | ICD-10-CM | POA: Diagnosis not present

## 2020-11-26 DIAGNOSIS — K219 Gastro-esophageal reflux disease without esophagitis: Secondary | ICD-10-CM | POA: Diagnosis not present

## 2020-11-26 DIAGNOSIS — M810 Age-related osteoporosis without current pathological fracture: Secondary | ICD-10-CM | POA: Diagnosis not present

## 2020-12-26 DIAGNOSIS — R718 Other abnormality of red blood cells: Secondary | ICD-10-CM | POA: Diagnosis not present

## 2020-12-26 DIAGNOSIS — Z800 Family history of malignant neoplasm of digestive organs: Secondary | ICD-10-CM | POA: Diagnosis not present

## 2020-12-26 DIAGNOSIS — R195 Other fecal abnormalities: Secondary | ICD-10-CM | POA: Diagnosis not present

## 2021-01-24 DIAGNOSIS — U071 COVID-19: Secondary | ICD-10-CM | POA: Diagnosis not present

## 2021-02-25 DIAGNOSIS — M170 Bilateral primary osteoarthritis of knee: Secondary | ICD-10-CM | POA: Diagnosis not present

## 2021-02-25 DIAGNOSIS — M810 Age-related osteoporosis without current pathological fracture: Secondary | ICD-10-CM | POA: Diagnosis not present

## 2021-03-01 ENCOUNTER — Other Ambulatory Visit: Payer: Self-pay

## 2021-03-01 ENCOUNTER — Other Ambulatory Visit: Payer: Self-pay | Admitting: *Deleted

## 2021-03-01 ENCOUNTER — Ambulatory Visit (HOSPITAL_COMMUNITY)
Admission: RE | Admit: 2021-03-01 | Discharge: 2021-03-01 | Disposition: A | Discharge status: Home / Self Care | Payer: Medicare HMO | Source: Ambulatory Visit | Attending: Vascular Surgery | Admitting: Vascular Surgery

## 2021-03-01 DIAGNOSIS — I872 Venous insufficiency (chronic) (peripheral): Secondary | ICD-10-CM | POA: Insufficient documentation

## 2021-03-08 ENCOUNTER — Other Ambulatory Visit: Payer: Self-pay

## 2021-03-08 ENCOUNTER — Ambulatory Visit: Payer: Medicare HMO | Admitting: Vascular Surgery

## 2021-03-08 ENCOUNTER — Encounter: Payer: Self-pay | Admitting: Vascular Surgery

## 2021-03-08 VITALS — BP 113/74 | HR 83 | Temp 98.2°F | Resp 20 | Ht 60.0 in | Wt 220.0 lb

## 2021-03-08 DIAGNOSIS — M7989 Other specified soft tissue disorders: Secondary | ICD-10-CM

## 2021-03-08 DIAGNOSIS — I872 Venous insufficiency (chronic) (peripheral): Secondary | ICD-10-CM

## 2021-03-08 DIAGNOSIS — R609 Edema, unspecified: Secondary | ICD-10-CM | POA: Diagnosis not present

## 2021-03-08 NOTE — Progress Notes (Signed)
Office Note     CC: Bilateral lower extremity heaviness Requesting Provider:  Baruch Goldmann, PA-C  HPI: Alicia Miranda is a 65 y.o. (1955/06/30) female who presents at the request of Alicia Contras, MD for evaluation of bilateral lower extremity heaviness that worsens throughout the day.  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  Two years ago, the patient was diagnosed with COVID and unfortunately gained 50 pounds.  Since that time, she has had bilateral lower extremity pain that worsens throughout the day.  Over the last several months, this is worsened, especially with exercise.  Per Alicia Miranda, she needs a knee replacement, which is on hold until she loses weight.  The bilateral lower extremity heaviness and pain is accompanied by a tired and aching feeling.  She denies burning, itching, bleeding, ulceration.  Alleviating factors include elevation.  The patient has not tried compression.  She is not on pain medications.  She has no previous vein procedures and no history of DVT.  Alicia Miranda denies history of rest pain, tissue loss, claudication in bilateral lower extremities.  Alicia Miranda is limited by knee pain.   The pt is on a statin for cholesterol management.  The pt is not on a daily aspirin.   Other AC: None The pt is on Cozaar for hypertension.   The pt is not diabetic.. Tobacco hx: No  Past Medical History:  Diagnosis Date   Allergy    Arthritis    legs   Asthma    GERD (gastroesophageal reflux disease)    Hyperlipidemia    Hypertension     Past Surgical History:  Procedure Laterality Date   ABDOMINAL HYSTERECTOMY     CHOLECYSTECTOMY     COLONOSCOPY     UPPER GASTROINTESTINAL ENDOSCOPY      Social History   Socioeconomic History   Marital status: Married    Spouse name: Not on file   Number of children: Not on file   Years of education: Not on file   Highest education level: Not on file  Occupational History   Not on file  Tobacco Use   Smoking status:  Former   Smokeless tobacco: Never   Tobacco comments:    quit smoking in 2002 or 2003  Vaping Use   Vaping Use: Never used  Substance and Sexual Activity   Alcohol use: Yes    Comment: very rarely   Drug use: No   Sexual activity: Not on file  Other Topics Concern   Not on file  Social History Narrative   Not on file   Social Determinants of Health   Financial Resource Strain: Not on file  Food Insecurity: Not on file  Transportation Needs: Not on file  Physical Activity: Not on file  Stress: Not on file  Social Connections: Not on file  Intimate Partner Violence: Not on file   . Family History  Problem Alicia Miranda Age of Onset   Colon cancer Brother        dx at 90   Esophageal cancer Neg Hx    Rectal cancer Neg Hx    Stomach cancer Neg Hx     Current Outpatient Medications  Medication Sig Dispense Refill   APPLE CIDER VINEGAR PO Take 1 tablet by mouth daily.     budesonide-formoterol (SYMBICORT) 80-4.5 MCG/ACT inhaler      CINNAMON PO Take 1,000 mg by mouth daily.     fluticasone (FLONASE) 50 MCG/ACT nasal spray      furosemide (  LASIX) 20 MG tablet 40 mg daily.      losartan (COZAAR) 50 MG tablet Take 100 mg by mouth once.      Melatonin 5 MG CAPS Take 5 mg by mouth at bedtime.     Misc Natural Products (OSTEO BI-FLEX JOINT SHIELD PO) Take 1 tablet by mouth daily.     pantoprazole (PROTONIX) 20 MG tablet Take 20 mg by mouth daily. May take 1 to 2 tablets daily     rosuvastatin (CRESTOR) 20 MG tablet Take 20 mg by mouth daily. Takes 0.5 tablet daily     Specialty Vitamins Products (GNP CENTURY ENERGY METABOLISM PO) Take by mouth.     TURMERIC PO Take 100 mg by mouth.     No current facility-administered medications for this visit.    Allergies  Allergen Reactions   Codeine Itching     REVIEW OF SYSTEMS:  . [X]  denotes positive finding, [ ]  denotes negative finding Cardiac  Comments:  Chest pain or chest pressure:    Shortness of breath upon exertion:     Short of breath when lying flat:    Irregular heart rhythm:        Vascular    Pain in calf, thigh, or hip brought on by ambulation:    Pain in feet at night that wakes you up from your sleep:     Blood clot in your veins:    Leg swelling:         Pulmonary    Oxygen at home:    Productive cough:     Wheezing:         Neurologic    Sudden weakness in arms or legs:     Sudden numbness in arms or legs:     Sudden onset of difficulty speaking or slurred speech:    Temporary loss of vision in one eye:     Problems with dizziness:         Gastrointestinal    Blood in stool:     Vomited blood:         Genitourinary    Burning when urinating:     Blood in urine:        Psychiatric    Major depression:         Hematologic    Bleeding problems:    Problems with blood clotting too easily:        Skin    Rashes or ulcers:        Constitutional    Fever or chills:      PHYSICAL EXAMINATION:  Vitals:   03/08/21 1350  BP: 113/74  Pulse: 83  Resp: 20  Temp: 98.2 F (36.8 C)  SpO2: 95%  Weight: 220 lb (99.8 kg)  Height: 5' (1.524 m)    General:  WDWN in NAD; vital signs documented above Gait: Not observed HENT: WNL, normocephalic Pulmonary: normal non-labored breathing , without Rales, rhonchi,  wheezing Cardiac: regular HR,  Abdomen: soft, NT, no masses Skin: without rashes Vascular Exam/Pulses:  Right Left  Radial 2+ (normal) 2+ (normal)  Ulnar 2+ (normal) 2+ (normal)  Femoral - -  Popliteal - -  DP 2+ (normal) 2+ (normal)  PT 1+ (weak) 1+ (weak)   Extremities: without ischemic changes, without Gangrene , without cellulitis; without open wounds;  Musculoskeletal: no muscle wasting or atrophy  Neurologic: A&O X 3;  No focal weakness or paresthesias are detected Psychiatric:  The pt has Normal affect.   Non-Invasive Vascular  Imaging:   Noninvasive vascular imaging was independently reviewed demonstrating venous insufficiency of both the superficial  and deep systems of the right leg.    ASSESSMENT/PLAN:: Alicia Miranda is a 65 y.o. female presenting with bilateral lower extremity leg heaviness and pain that worsens throughout the day.  Right lower extremity venous duplex ultrasonography demonstrates venous insufficiency in the right leg.  The left leg was not assessed.  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  Per Alicia Miranda, Miranda legs felt much better prior to gaining 50 pounds.  She is currently working toward weight loss, which will again, help Miranda legs significantly.  Alicia Miranda was given knee-high compression stockings today in an effort to alleviate some of Miranda venous congestion, however Alicia think the biggest benefit will be weight loss as lipedema leads to significant heaviness and swelling over the course of the day. Alicia will see Alicia Miranda in 6 months to assess Miranda legs, hopefully with significant weight loss. Alicia Miranda for Miranda efforts, and asked Miranda to call our office if any needs arose.  Alicia also mentioned that due to Miranda morbid obesity, pool exercise may be extremely beneficial as it is much easier on the joints.  Alicia Miranda is a member of the [employer], and will be looking into classes.   Broadus John, MD [facility] [phone]

## 2021-03-27 DIAGNOSIS — R195 Other fecal abnormalities: Secondary | ICD-10-CM | POA: Diagnosis not present

## 2021-03-27 DIAGNOSIS — Z800 Family history of malignant neoplasm of digestive organs: Secondary | ICD-10-CM | POA: Diagnosis not present

## 2021-03-27 DIAGNOSIS — D175 Benign lipomatous neoplasm of intra-abdominal organs: Secondary | ICD-10-CM | POA: Diagnosis not present

## 2021-03-27 DIAGNOSIS — K5730 Diverticulosis of large intestine without perforation or abscess without bleeding: Secondary | ICD-10-CM | POA: Diagnosis not present

## 2021-03-27 DIAGNOSIS — K648 Other hemorrhoids: Secondary | ICD-10-CM | POA: Diagnosis not present

## 2021-07-05 DIAGNOSIS — M1990 Unspecified osteoarthritis, unspecified site: Secondary | ICD-10-CM | POA: Diagnosis not present

## 2021-07-05 DIAGNOSIS — R609 Edema, unspecified: Secondary | ICD-10-CM | POA: Diagnosis not present

## 2021-07-05 DIAGNOSIS — Z0000 Encounter for general adult medical examination without abnormal findings: Secondary | ICD-10-CM | POA: Diagnosis not present

## 2021-07-05 DIAGNOSIS — I10 Essential (primary) hypertension: Secondary | ICD-10-CM | POA: Diagnosis not present

## 2021-07-05 DIAGNOSIS — M810 Age-related osteoporosis without current pathological fracture: Secondary | ICD-10-CM | POA: Diagnosis not present

## 2021-07-05 DIAGNOSIS — J309 Allergic rhinitis, unspecified: Secondary | ICD-10-CM | POA: Diagnosis not present

## 2021-07-05 DIAGNOSIS — E7800 Pure hypercholesterolemia, unspecified: Secondary | ICD-10-CM | POA: Diagnosis not present

## 2021-07-05 DIAGNOSIS — E559 Vitamin D deficiency, unspecified: Secondary | ICD-10-CM | POA: Diagnosis not present

## 2021-07-05 DIAGNOSIS — J45909 Unspecified asthma, uncomplicated: Secondary | ICD-10-CM | POA: Diagnosis not present

## 2021-07-05 DIAGNOSIS — K219 Gastro-esophageal reflux disease without esophagitis: Secondary | ICD-10-CM | POA: Diagnosis not present

## 2021-07-24 IMAGING — MG MM DIGITAL SCREENING BILAT W/ TOMO AND CAD
6 of 12 series · 6 of 36 positions shown · non-contrast
Comparison: Previous exam(s).

CLINICAL DATA: Screening.

EXAM:
DIGITAL SCREENING BILATERAL MAMMOGRAM WITH TOMOSYNTHESIS AND CAD
TECHNIQUE: Bilateral screening digital craniocaudal and mediolateral oblique
mammograms were obtained. Bilateral screening digital breast
tomosynthesis was performed. The images were evaluated with
computer-aided detection.

[R CC synth-2D (1 of 3)]
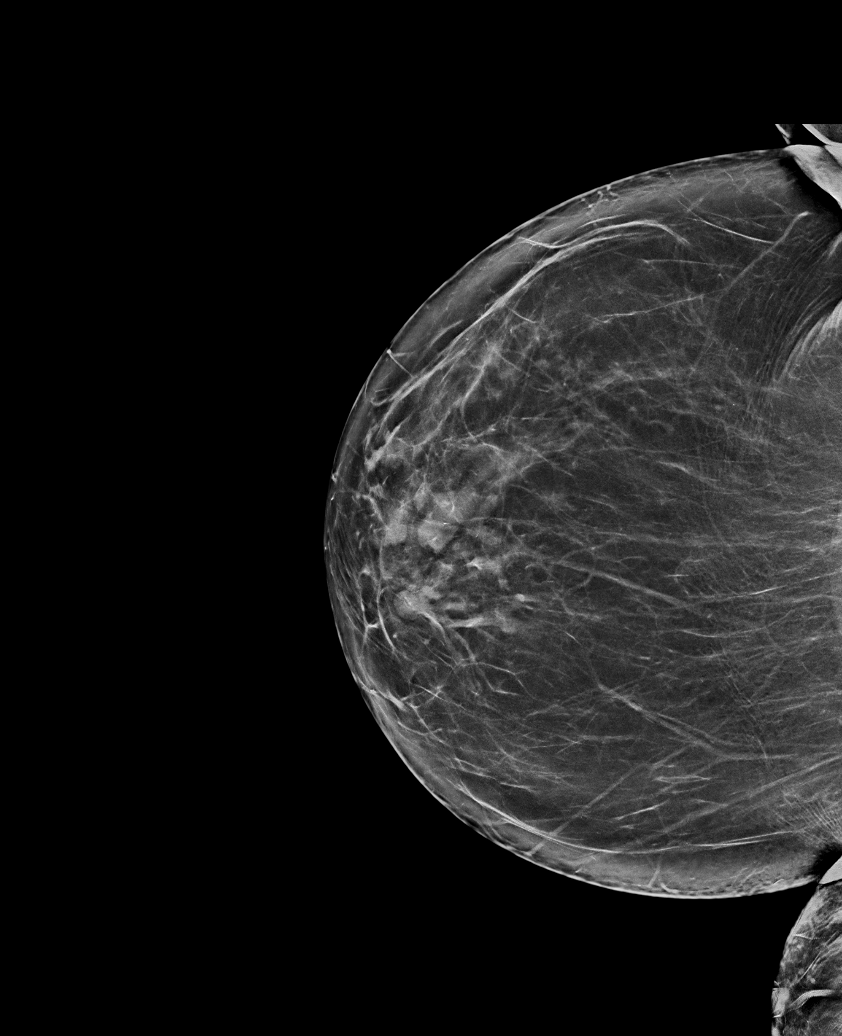

[L MLO synth-2D]
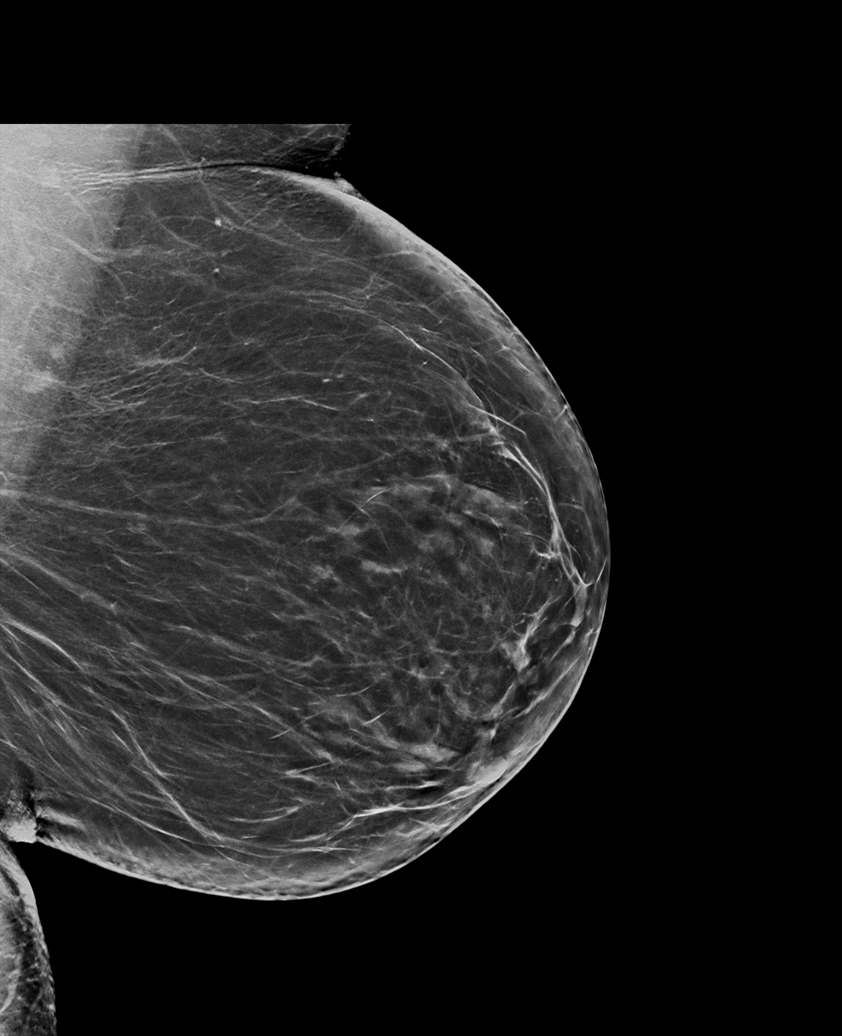

[R MLO synth-2D]
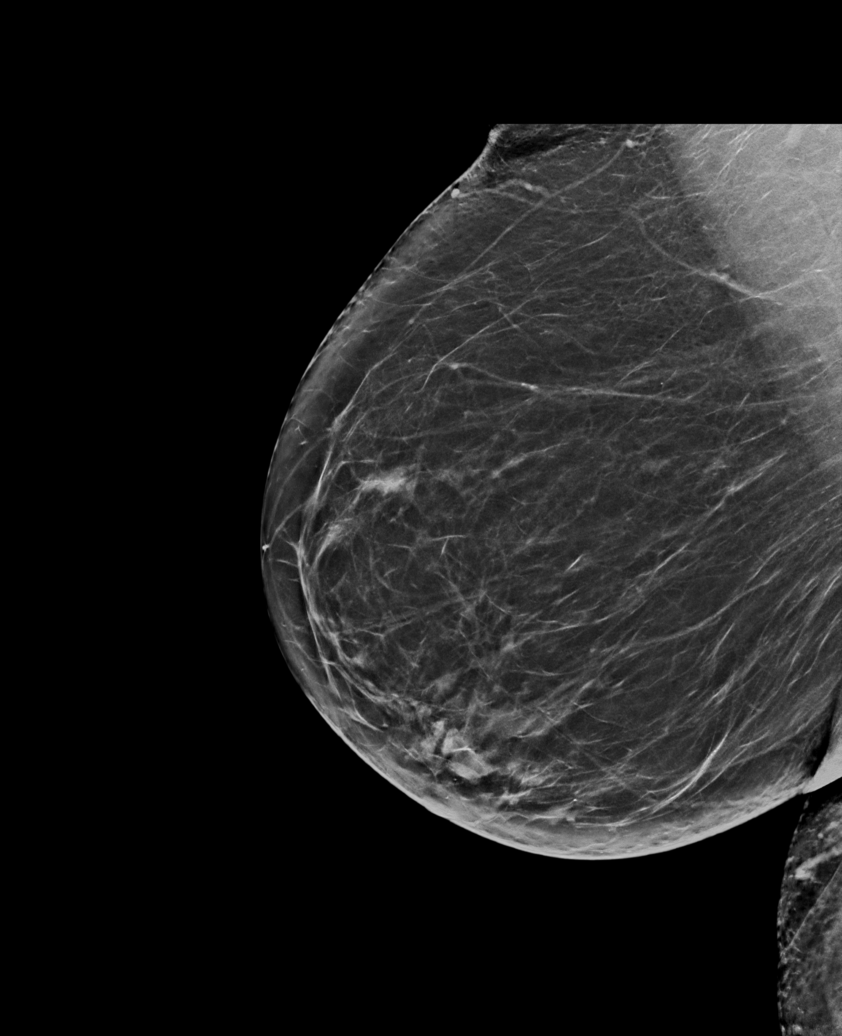

[R CC synth-2D (2 of 3)]
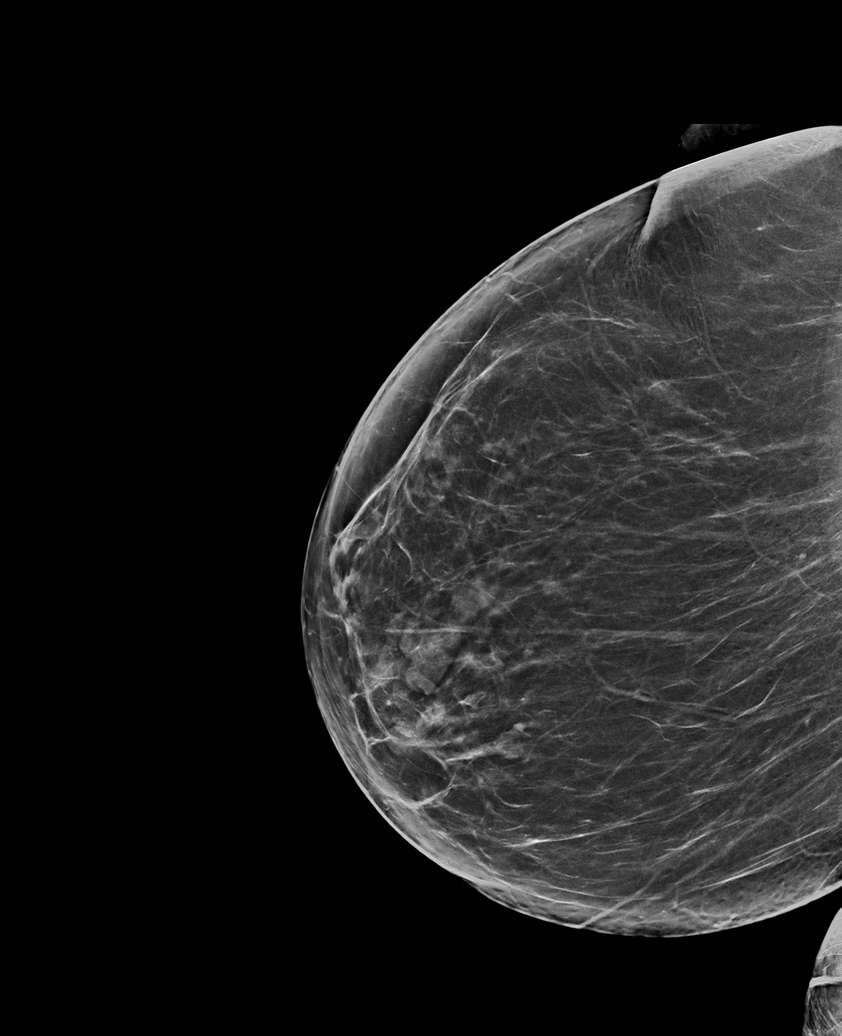

[L CC synth-2D]
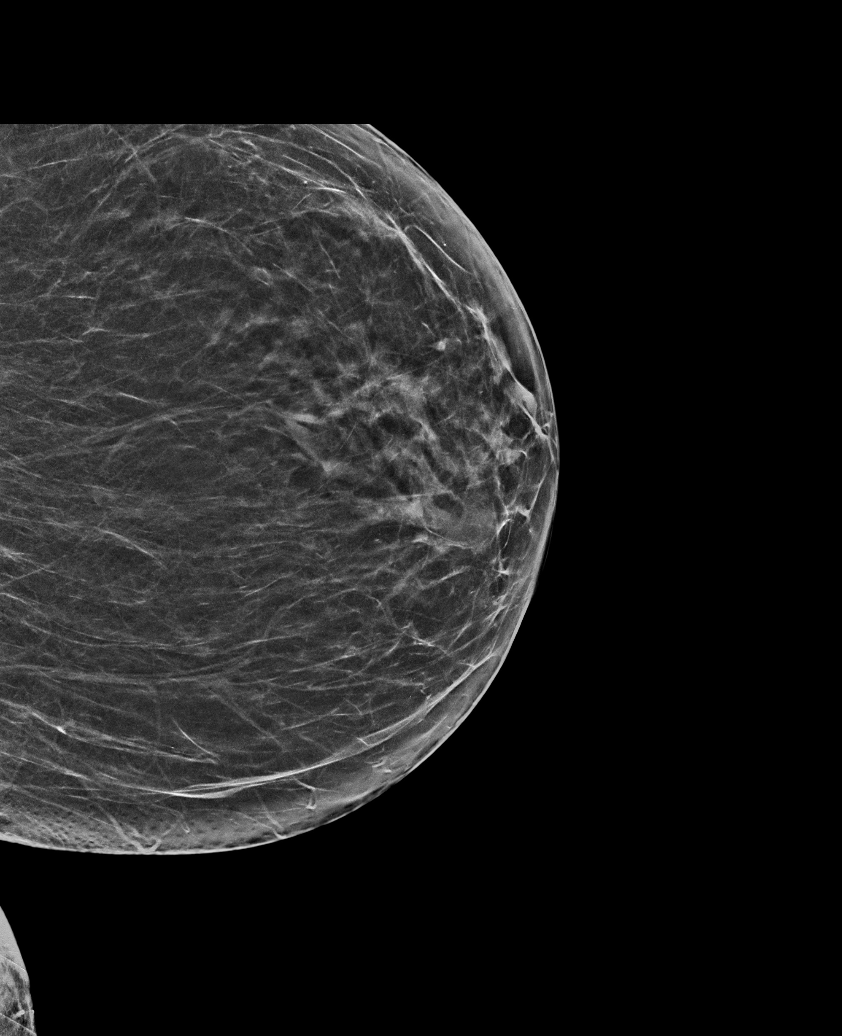

[R CC synth-2D (3 of 3)]
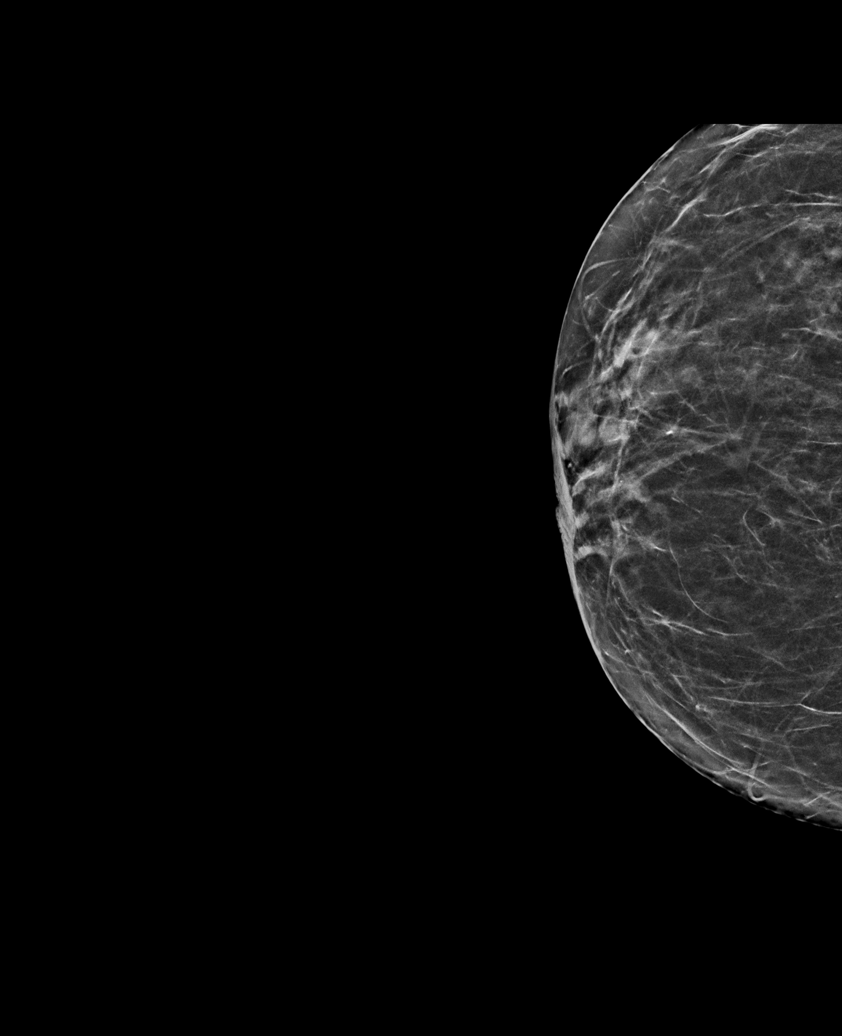

[6 of 36 positions shown; findings below may reference images not displayed]

ACR Breast Density Category b: There are scattered areas of
fibroglandular density.
FINDINGS: There are no findings suspicious for malignancy.
IMPRESSION: No mammographic evidence of malignancy. A result letter of this
screening mammogram will be mailed directly to the patient.

RECOMMENDATION:
Screening mammogram in one year. (Code:51-O-LD2)

BI-RADS CATEGORY  1: Negative.

## 2021-08-15 ENCOUNTER — Other Ambulatory Visit (HOSPITAL_COMMUNITY): Payer: Self-pay | Admitting: Family Medicine

## 2021-08-15 DIAGNOSIS — Z1231 Encounter for screening mammogram for malignant neoplasm of breast: Secondary | ICD-10-CM

## 2021-08-21 ENCOUNTER — Ambulatory Visit (HOSPITAL_COMMUNITY)
Admission: RE | Admit: 2021-08-21 | Discharge: 2021-08-21 | Disposition: A | Discharge status: Home / Self Care | Payer: Medicare HMO | Source: Ambulatory Visit | Attending: Family Medicine | Admitting: Family Medicine

## 2021-08-21 ENCOUNTER — Other Ambulatory Visit: Payer: Self-pay

## 2021-08-21 DIAGNOSIS — Z1231 Encounter for screening mammogram for malignant neoplasm of breast: Secondary | ICD-10-CM | POA: Diagnosis not present

## 2021-08-26 DIAGNOSIS — M810 Age-related osteoporosis without current pathological fracture: Secondary | ICD-10-CM | POA: Diagnosis not present

## 2021-08-29 ENCOUNTER — Ambulatory Visit (INDEPENDENT_AMBULATORY_CARE_PROVIDER_SITE_OTHER): Payer: Medicare HMO | Admitting: Ophthalmology

## 2021-08-29 ENCOUNTER — Other Ambulatory Visit: Payer: Self-pay

## 2021-08-29 ENCOUNTER — Encounter (INDEPENDENT_AMBULATORY_CARE_PROVIDER_SITE_OTHER): Payer: Self-pay | Admitting: Ophthalmology

## 2021-08-29 DIAGNOSIS — H25813 Combined forms of age-related cataract, bilateral: Secondary | ICD-10-CM | POA: Diagnosis not present

## 2021-08-29 DIAGNOSIS — H3322 Serous retinal detachment, left eye: Secondary | ICD-10-CM

## 2021-08-29 DIAGNOSIS — H35033 Hypertensive retinopathy, bilateral: Secondary | ICD-10-CM | POA: Diagnosis not present

## 2021-08-29 DIAGNOSIS — I10 Essential (primary) hypertension: Secondary | ICD-10-CM

## 2021-08-29 DIAGNOSIS — H33311 Horseshoe tear of retina without detachment, right eye: Secondary | ICD-10-CM | POA: Diagnosis not present

## 2021-08-29 NOTE — H&P (Signed)
Alicia Miranda is an 66 y.o. female.   ? ?Chief Complaint: retinal detachment, left eye ? ?HPI: Pt presented with 2-day history of vision loss OS. On dilated exam, found to have a macula- and fovea-involving retinal detachment. After a discussion of the risks, benefits and alternatives to surgery, the pt elects to proceed with surgical repair of the retinal detachment-- scleral buckle + 25g PPV w/ endolaser and gas, left eye, under general anesthesia. ? ?Past Medical History:  ?Diagnosis Date  ? Allergy   ? Arthritis   ? legs  ? Asthma   ? GERD (gastroesophageal reflux disease)   ? Hyperlipidemia   ? Hypertension   ? ?Past Surgical History:  ?Procedure Laterality Date  ? ABDOMINAL HYSTERECTOMY    ? CHOLECYSTECTOMY    ? COLONOSCOPY    ? UPPER GASTROINTESTINAL ENDOSCOPY    ? ?Family History  ?Problem Relation Age of Onset  ? Retinal detachment Sister   ? Diabetes Sister   ? Glaucoma Brother   ? Diabetes Brother   ? Colon cancer Brother   ?     dx at 71  ? Esophageal cancer Neg Hx   ? Rectal cancer Neg Hx   ? Stomach cancer Neg Hx   ? ?Social History:  reports that she has quit smoking. She has never used smokeless tobacco. She reports current alcohol use. She reports that she does not use drugs. ? ?Allergies:  ?Allergies  ?Allergen Reactions  ? Codeine Itching  ? ?No medications prior to admission.  ? ?Review of systems otherwise negative ? ?There were no vitals taken for this visit. ? ?Physical exam: ?Mental status: oriented x3. ?Eyes: See eye exam associated with this date of surgery ?Ears, Nose, Throat: within normal limits ?Neck: Within Normal limits ?General: within normal limits ?Chest: Within normal limits ?Breast: deferred ?Heart: Within normal limits ?Abdomen: Within normal limits ?GU: deferred ?Extremities: within normal limits ?Skin: within normal limits ? ?Assessment/Plan ?1. Retinal detachment, left eye ? ?Plan: To [facility] for scleral buckle + 25g PPV w/ endolaser and gas, left eye, under general  anesthesia. ?- case scheduled for Thursday, 3.23.23, 1pm, [facility] OR 08 ? ?Gardiner Sleeper, M.D., Ph.D. ?Vitreoretinal Surgeon ?[place] ? ?  ?

## 2021-08-29 NOTE — Progress Notes (Signed)
?[facility] Note ? ?08/29/2021 ? ?  ? ?CHIEF COMPLAINT ?Patient presents for Retina Evaluation ? ? ?HISTORY OF PRESENT ILLNESS: ?Alicia Miranda is a 66 y.o. female who presents to the clinic today for:  ? ?HPI   ? ? Retina Evaluation   ?In left eye.  This started 2 days ago.  Duration of 2 days.  Associated Symptoms Floaters.  Negative for Flashes, Distortion, Blind Spot, Pain, Redness, Photophobia, Glare, Trauma, Scalp Tenderness, Jaw Claudication, Shoulder/Hip pain, Fever, Weight Loss and Fatigue.  I, the attending physician,  performed the HPI with the patient and updated documentation appropriately. ? ?  ?  ? ? Comments   ?Patient referred by Dr. Madilyn Hook for possible RD OS. Patient reports having long-standing floaters OU. Floaters no worse than before. Denies flashes. Had sudden onset of nasal vision missing, starting about 2 days ago.  ? ?  ?  ?Last edited by [name], MD on 08/29/2021  4:58 PM.  ?  ?Patient states referred by Dr. Madilyn Hook for RD OS. Patient reports having floaters OU, but not new. Floaters no worse than before. Patient denies flashes. Sudden onset of decrease in peripheral vision OS about 2 days ago.  ? ?Referring physician: ?Madilyn Hook, OD ?[address] ?[place],  [place] [address] ? ?HISTORICAL INFORMATION:  ? ?Selected notes from the [facility] ?Referred by Dr. Madilyn Hook for possible RD OS ?LEE: 03/16/02023 ?Ocular Hx-  ?PMH- HTN ?  ? ?CURRENT MEDICATIONS: ?No current outpatient medications on file. (Ophthalmic Drugs)  ? ?No current facility-administered medications for this visit. (Ophthalmic Drugs)  ? ?Current Outpatient Medications (Other)  ?Medication Sig  ? APPLE CIDER VINEGAR PO Take 1 tablet by mouth daily.  ? budesonide-formoterol (SYMBICORT) 80-4.5 MCG/ACT inhaler   ? CINNAMON PO Take 1,000 mg by mouth daily.  ? FEROSUL 325 (65 Fe) MG tablet Take 325 mg by mouth daily.  ? fluticasone (FLONASE) 50 MCG/ACT nasal spray    ? furosemide (LASIX) 20 MG tablet 40 mg daily.   ? losartan (COZAAR) 50 MG tablet Take 100 mg by mouth once.   ? Melatonin 5 MG CAPS Take 5 mg by mouth at bedtime.  ? Misc Natural Products (OSTEO BI-FLEX JOINT SHIELD PO) Take 1 tablet by mouth daily.  ? pantoprazole (PROTONIX) 20 MG tablet Take 20 mg by mouth daily. May take 1 to 2 tablets daily  ? pantoprazole (PROTONIX) 40 MG tablet Take 40 mg by mouth 2 (two) times daily.  ? rosuvastatin (CRESTOR) 20 MG tablet Take 20 mg by mouth daily. Takes 0.5 tablet daily  ? Specialty Vitamins Products (GNP CENTURY ENERGY METABOLISM PO) Take by mouth.  ? TURMERIC PO Take 100 mg by mouth.  ? ?No current facility-administered medications for this visit. (Other)  ? ?REVIEW OF SYSTEMS: ?ROS   ?Positive for: Musculoskeletal, Cardiovascular, Eyes, Respiratory ?Negative for: Constitutional, Gastrointestinal, Neurological, Skin, Genitourinary, HENT, Endocrine, Psychiatric, Allergic/Imm, Heme/Lymph ?Last edited by Roselee Nova D, COT on 08/29/2021  2:08 PM.  ?  ? ?ALLERGIES ?Allergies  ?Allergen Reactions  ? Codeine Itching  ? ?PAST MEDICAL HISTORY ?Past Medical History:  ?Diagnosis Date  ? Allergy   ? Arthritis   ? legs  ? Asthma   ? GERD (gastroesophageal reflux disease)   ? Hyperlipidemia   ? Hypertension   ? ?Past Surgical History:  ?Procedure Laterality Date  ? ABDOMINAL HYSTERECTOMY    ? CHOLECYSTECTOMY    ? COLONOSCOPY    ?  UPPER GASTROINTESTINAL ENDOSCOPY    ? ?FAMILY HISTORY ?Family History  ?Problem Relation Age of Onset  ? Retinal detachment Sister   ? Diabetes Sister   ? Glaucoma Brother   ? Diabetes Brother   ? Colon cancer Brother   ?     dx at 54  ? Esophageal cancer Neg Hx   ? Rectal cancer Neg Hx   ? Stomach cancer Neg Hx   ? ?SOCIAL HISTORY ?Social History  ? ?Tobacco Use  ? Smoking status: Former  ? Smokeless tobacco: Never  ? Tobacco comments:  ?  quit smoking in 2002 or 2003  ?Vaping Use  ? Vaping Use: Never used  ?Substance Use Topics  ? Alcohol use: Yes  ?   Comment: very rarely  ? Drug use: No  ?  ? ?  ?OPHTHALMIC EXAM: ? ?Base Eye Exam   ? ? Visual Acuity (Snellen - Linear)   ? ?   Right Left  ? Dist cc 20/25 +1 CF'@1'$   ? Dist ph cc NI NI  ? ? Correction: Glasses  ? ?  ?  ? ? Tonometry (Tonopen, 2:14 PM)   ? ?   Right Left  ? Pressure 21 19  ? ?  ?  ? ? Pupils   ? ?   Dark Light Shape React APD  ? Right 6 6 Round None None  ? Left 6 6 Round None ?  ?? APD OS, probable, but patient dilated OU ? ?  ?  ? ? Visual Fields (Counting fingers)   ? ?   Left Right  ?   Full  ? Restrictions Total inferior nasal deficiency   ? ?  ?  ? ? Extraocular Movement   ? ?   Right Left  ?  Full, Ortho Full, Ortho  ? ?  ?  ? ? Neuro/Psych   ? ? Oriented x3: Yes  ? Mood/Affect: Normal  ? ?  ?  ? ? Dilation   ? ? Both eyes: 1.0% Mydriacyl, 2.5% Phenylephrine @ 2:14 PM  ? ?  ?  ? ?  ? ?Slit Lamp and Fundus Exam   ? ? Slit Lamp Exam   ? ?   Right Left  ? Lids/Lashes Dermatochalasis - upper lid Dermatochalasis - upper lid, mild, Meibomian gland dysfunction  ? Conjunctiva/Sclera white and quiet white and quiet  ? Cornea 1+ inferior PEE 1+ inferior PEE  ? Anterior Chamber deep and clear deep and clear  ? Iris round and dilated round and dilated  ? Lens 2+ NS, 2+ cortical 2+ NS, 2+ cortical  ? Anterior Vitreous syneresis, asteroid hyalosis, PVD syneresis  ? ?  ?  ? ? Fundus Exam   ? ?   Right Left  ? Disc pink and sharp pink and sharp  ? C/D Ratio 0.5 0.5  ? Macula flat, good foveal reflex, no heme or edema bullous detachment with corrugations, extending all the way to disc  ? Vessels mild attenuation, mild tortuosity attenuated, tortuous  ? Periphery operculated hole @ 1030 with mild surrounding pigment, no SRF small tear with fibrosis @ 0200, bullous, temporal RD from 1230 to 0430  ? ?  ?  ? ?  ? ?Refraction   ? ? Wearing Rx   ? ?   Sphere Cylinder Axis Add  ? Right +1.00 +1.00 130 +2.75  ? Left +1.25 +0.75 017 +2.75  ? ?  ?  ? ? Manifest Refraction   ? ?  Sphere Cylinder Axis Dist VA  ? Right  +1.50 +1.00 130 20/20-1  ? Left +/- sphere   NI  ? ?  ?  ? ?  ? ?IMAGING AND PROCEDURES  ?Imaging and Procedures for 08/29/2021 ? ?OCT, Retina - OU - Both Eyes   ? ?   ?Right Eye ?Quality was good. Central Foveal Thickness: 268. Progression has no prior data. Findings include normal foveal contour, no IRF, no SRF.  ? ?Left Eye ?Quality was good. Central Foveal Thickness: 534. Progression has no prior data. Findings include subretinal fluid, abnormal foveal contour, no IRF (Bullous macula off RD).  ? ?Notes ?*Images captured and stored on drive ? ?Diagnosis / Impression:  ?OD: NFP; no IRF/SRF ?OS: bullous, macula-involving RD ? ?Clinical management:  ?See below ? ?Abbreviations: NFP - Normal foveal profile. CME - cystoid macular edema. PED - pigment epithelial detachment. IRF - intraretinal fluid. SRF - subretinal fluid. EZ - ellipsoid zone. ERM - epiretinal membrane. ORA - outer retinal atrophy. ORT - outer retinal tubulation. SRHM - subretinal hyper-reflective material. IRHM - intraretinal hyper-reflective material ? ? ?  ? ?Color Fundus Photography Optos - OU - Both Eyes   ? ?   ?Right Eye ?Progression has no prior data. Disc findings include normal observations. Macula : normal observations. Vessels : attenuated. Periphery : RPE abnormality, hole (Mild asteroid inferiorly, operculated hole at 1030 equator).  ? ?Left Eye ?Progression has no prior data. Disc findings include normal observations. Macula : detached. Vessels : attenuated, tortuous vessels. Periphery : detachment (Bullous, temporal detachment from 1230 to 0430. ? Tear @ 0200).  ? ?Notes ?**Images stored on drive** ? ?Impression: ?OD: retinal hole at 1030 ?OS: bullous RD from 1230 to 0430; ?Tear at 0200 ? ? ?  ? ?Repair Retinal Breaks, Laser - OD - Right Eye   ? ?   ?LASER PROCEDURE NOTE ? ?Procedure:  Barrier laser retinopexy using slit lamp laser, right eye  ? ?Diagnosis:   Retinal tear, right eye ?                    Operculated retinal hole at 1030  o'clock, equator  ? ?Surgeon: [name], MD, PhD ? ?Anesthesia: Topical ? ?Informed consent obtained, operative eye marked, and time out performed prior to initiation of laser.  ? ?Laser settings:  ?Lumen

## 2021-09-03 ENCOUNTER — Other Ambulatory Visit: Payer: Self-pay

## 2021-09-03 ENCOUNTER — Encounter (HOSPITAL_COMMUNITY): Payer: Self-pay | Admitting: Ophthalmology

## 2021-09-03 NOTE — Progress Notes (Signed)
PCP - Dr. Moreen Fowler  ?Cardiologist - denies ?EKG - DOS ?Chest x-ray -  ?ECHO -  ?Cardiac Cath -  ?CPAP -  ? ?ERAS Protcol - n/a - clears until 1000 ?COVID TEST- n/a ? ?Anesthesia review: n/a ? ?------------- ? ?SDW INSTRUCTIONS: ? ?Your procedure is scheduled on Thursday 3/23. Please report to [facility] Main Entrance "A" at 1030 A.M., and check in at the Admitting office. Call this number if you have problems the morning of surgery: [phone] ? ? ?Remember: Do not eat after midnight the night before your surgery ? ?You may drink clear liquids until 10:00 AM the morning of your surgery.   ?Clear liquids allowed are: Water, Non-Citrus Juices (without pulp), Carbonated Beverages, Clear Tea, Black Coffee Only, and Gatorade (do NOT add anything to drinks)  ?  ?Medications to take morning of surgery with a sip of water include: ?fexofenadine (ALLEGRA)  ?pantoprazole (PROTONIX)  ?rosuvastatin (CRESTOR)  ? ?As of today, STOP taking any Aspirin (unless otherwise instructed by your surgeon), Aleve, Naproxen, Ibuprofen, Motrin, Advil, Goody's, BC's, all herbal medications, fish oil, and all vitamins. ? ?  ?The Morning of Surgery ?Do not wear jewelry, make-up or nail polish. ?Do not wear lotions, powders, or perfumes, or deodorant ?Do not bring valuables to the hospital. ?[facility] is not responsible for any belongings or valuables. ? ?If you are a smoker, DO NOT Smoke 24 hours prior to surgery ? ?If you wear a CPAP at night please bring your mask the morning of surgery  ? ?Remember that you must have someone to transport you home after your surgery, and remain with you for 24 hours if you are discharged the same day. ? ?Please bring cases for contacts, glasses, hearing aids, dentures or bridgework because it cannot be worn into surgery.  ? ?Patients discharged the day of surgery will not be allowed to drive home.  ? ?Please shower the NIGHT BEFORE/MORNING OF SURGERY (use antibacterial soap like DIAL soap if possible). Wear  comfortable clothes the morning of surgery. Oral Hygiene is also important to reduce your risk of infection.  Remember - BRUSH YOUR TEETH THE MORNING OF SURGERY WITH YOUR REGULAR TOOTHPASTE ? ?Patient denies shortness of breath, fever, cough and chest pain.  ? ? ?   ? ?

## 2021-09-05 ENCOUNTER — Ambulatory Visit (HOSPITAL_BASED_OUTPATIENT_CLINIC_OR_DEPARTMENT_OTHER): Payer: Medicare HMO | Admitting: Anesthesiology

## 2021-09-05 ENCOUNTER — Other Ambulatory Visit: Payer: Self-pay

## 2021-09-05 ENCOUNTER — Ambulatory Visit (HOSPITAL_COMMUNITY): Payer: Medicare HMO | Admitting: Anesthesiology

## 2021-09-05 ENCOUNTER — Ambulatory Visit (HOSPITAL_COMMUNITY)
Admission: RE | Admit: 2021-09-05 | Discharge: 2021-09-05 | Disposition: A | Discharge status: Home / Self Care | Payer: Medicare HMO | Attending: Ophthalmology | Admitting: Ophthalmology

## 2021-09-05 ENCOUNTER — Encounter (HOSPITAL_COMMUNITY): Payer: Self-pay | Admitting: Ophthalmology

## 2021-09-05 ENCOUNTER — Encounter (HOSPITAL_COMMUNITY)
Admission: RE | Disposition: A | Discharge status: Home / Self Care | Payer: Self-pay | Source: Home / Self Care | Attending: Ophthalmology

## 2021-09-05 DIAGNOSIS — M1990 Unspecified osteoarthritis, unspecified site: Secondary | ICD-10-CM

## 2021-09-05 DIAGNOSIS — Z87891 Personal history of nicotine dependence: Secondary | ICD-10-CM | POA: Insufficient documentation

## 2021-09-05 DIAGNOSIS — E785 Hyperlipidemia, unspecified: Secondary | ICD-10-CM

## 2021-09-05 DIAGNOSIS — H338 Other retinal detachments: Secondary | ICD-10-CM | POA: Diagnosis not present

## 2021-09-05 DIAGNOSIS — I10 Essential (primary) hypertension: Secondary | ICD-10-CM

## 2021-09-05 DIAGNOSIS — E669 Obesity, unspecified: Secondary | ICD-10-CM | POA: Diagnosis not present

## 2021-09-05 DIAGNOSIS — H33002 Unspecified retinal detachment with retinal break, left eye: Secondary | ICD-10-CM | POA: Insufficient documentation

## 2021-09-05 HISTORY — PX: PARS PLANA VITRECTOMY: SHX2166

## 2021-09-05 HISTORY — PX: PHOTOCOAGULATION WITH LASER: SHX6027

## 2021-09-05 HISTORY — PX: PERFLUORONE INJECTION: SHX5302

## 2021-09-05 HISTORY — PX: GAS/FLUID EXCHANGE: SHX5334

## 2021-09-05 HISTORY — PX: SCLERAL BUCKLE: SHX5340

## 2021-09-05 HISTORY — PX: GAS INSERTION: SHX5336

## 2021-09-05 LAB — BASIC METABOLIC PANEL
Anion gap: 8 (ref 5–15)
BUN: 15 mg/dL (ref 8–23)
CO2: 26 mmol/L (ref 22–32)
Calcium: 8.9 mg/dL (ref 8.9–10.3)
Chloride: 106 mmol/L (ref 98–111)
Creatinine, Ser: 0.72 mg/dL (ref 0.44–1.00)
GFR, Estimated: >60 mL/min (ref >60)
Glucose, Bld: 108 mg/dL — ABNORMAL HIGH (ref 70–99)
Potassium: 3.4 mmol/L — ABNORMAL LOW (ref 3.5–5.1)
Sodium: 140 mmol/L (ref 135–145)

## 2021-09-05 LAB — CBC
HCT: 40 % (ref 36.0–46.0)
Hemoglobin: 13.3 g/dL (ref 12.0–15.0)
MCH: 28.5 pg (ref 26.0–34.0)
MCHC: 33.3 g/dL (ref 30.0–36.0)
MCV: 85.7 fL (ref 80.0–100.0)
Platelets: 205 10*3/uL (ref 150–400)
RBC: 4.67 MIL/uL (ref 3.87–5.11)
RDW: 13.2 % (ref 11.5–15.5)
WBC: 4.6 10*3/uL (ref 4.0–10.5)
nRBC: 0 % (ref 0.0–0.2)

## 2021-09-05 SURGERY — SCLERAL BUCKLE
Anesthesia: General | Site: Eye | Laterality: Left

## 2021-09-05 MED ORDER — FENTANYL CITRATE (PF) 250 MCG/5ML IJ SOLN
INTRAMUSCULAR | Status: DC | PRN
  Administered 2021-09-05 (×3): 50 ug via INTRAVENOUS

## 2021-09-05 MED ORDER — BUPIVACAINE HCL (PF) 0.75 % IJ SOLN
INTRAMUSCULAR | Status: AC
  Filled 2021-09-05: qty 30

## 2021-09-05 MED ORDER — STERILE WATER FOR INJECTION IJ SOLN
INTRAMUSCULAR | Status: DC | PRN
  Administered 2021-09-05: .5 mL

## 2021-09-05 MED ORDER — GATIFLOXACIN 0.5 % OP SOLN
OPHTHALMIC | Status: AC
  Filled 2021-09-05: qty 2.5

## 2021-09-05 MED ORDER — PREDNISOLONE ACETATE 1 % OP SUSP
OPHTHALMIC | Status: AC
  Filled 2021-09-05: qty 5

## 2021-09-05 MED ORDER — MIDAZOLAM HCL 2 MG/2ML IJ SOLN
INTRAMUSCULAR | Status: AC
  Filled 2021-09-05: qty 2

## 2021-09-05 MED ORDER — FENTANYL CITRATE (PF) 250 MCG/5ML IJ SOLN
INTRAMUSCULAR | Status: AC
  Filled 2021-09-05: qty 5

## 2021-09-05 MED ORDER — AMISULPRIDE (ANTIEMETIC) 5 MG/2ML IV SOLN
INTRAVENOUS | Status: AC
  Filled 2021-09-05: qty 4

## 2021-09-05 MED ORDER — PHENYLEPHRINE HCL (PRESSORS) 10 MG/ML IV SOLN
INTRAVENOUS | Status: AC
  Filled 2021-09-05: qty 1

## 2021-09-05 MED ORDER — ACETAMINOPHEN 500 MG PO TABS
1000.0000 mg | ORAL_TABLET | Freq: Once | ORAL | Status: AC
  Administered 2021-09-05: 1000 mg via ORAL
  Filled 2021-09-05: qty 2

## 2021-09-05 MED ORDER — ONDANSETRON HCL 4 MG/2ML IJ SOLN
INTRAMUSCULAR | Status: AC
  Filled 2021-09-05: qty 2

## 2021-09-05 MED ORDER — BACITRACIN-POLYMYXIN B 500-10000 UNIT/GM OP OINT
TOPICAL_OINTMENT | OPHTHALMIC | Status: AC
  Filled 2021-09-05: qty 3.5

## 2021-09-05 MED ORDER — EPINEPHRINE PF 1 MG/ML IJ SOLN
INTRAOCULAR | Status: DC | PRN
  Administered 2021-09-05: 500.3 mL

## 2021-09-05 MED ORDER — CEFTAZIDIME 1 G IJ SOLR
INTRAMUSCULAR | Status: AC
  Filled 2021-09-05: qty 1

## 2021-09-05 MED ORDER — LACTATED RINGERS IV SOLN: INTRAVENOUS | Status: DC

## 2021-09-05 MED ORDER — PHENYLEPHRINE HCL-NACL 20-0.9 MG/250ML-% IV SOLN
INTRAVENOUS | Status: DC | PRN
  Administered 2021-09-05: 30 ug/min via INTRAVENOUS

## 2021-09-05 MED ORDER — LIDOCAINE HCL (PF) 1 % IJ SOLN
INTRAMUSCULAR | Status: AC
  Filled 2021-09-05: qty 30

## 2021-09-05 MED ORDER — TRIAMCINOLONE ACETONIDE 40 MG/ML IJ SUSP
INTRAMUSCULAR | Status: AC
  Filled 2021-09-05: qty 5

## 2021-09-05 MED ORDER — ORAL CARE MOUTH RINSE: 15.0000 mL | Freq: Once | OROMUCOSAL | Status: AC

## 2021-09-05 MED ORDER — BRIMONIDINE TARTRATE 0.2 % OP SOLN
OPHTHALMIC | Status: AC
  Filled 2021-09-05: qty 5

## 2021-09-05 MED ORDER — GLYCOPYRROLATE PF 0.2 MG/ML IJ SOSY
PREFILLED_SYRINGE | INTRAMUSCULAR | Status: AC
  Filled 2021-09-05: qty 1

## 2021-09-05 MED ORDER — ONDANSETRON HCL 4 MG/2ML IJ SOLN: 4.0000 mg | Freq: Once | INTRAMUSCULAR | Status: DC | PRN

## 2021-09-05 MED ORDER — GLYCOPYRROLATE PF 0.2 MG/ML IJ SOSY
PREFILLED_SYRINGE | INTRAMUSCULAR | Status: DC | PRN
  Administered 2021-09-05: .2 mg via INTRAVENOUS

## 2021-09-05 MED ORDER — PROPOFOL 10 MG/ML IV BOLUS
INTRAVENOUS | Status: AC
  Filled 2021-09-05: qty 20

## 2021-09-05 MED ORDER — CHLORHEXIDINE GLUCONATE 0.12 % MT SOLN
OROMUCOSAL | Status: AC
  Administered 2021-09-05: 15 mL via OROMUCOSAL
  Filled 2021-09-05: qty 15

## 2021-09-05 MED ORDER — PREDNISOLONE ACETATE 1 % OP SUSP
OPHTHALMIC | Status: DC | PRN
  Administered 2021-09-05: 1 [drp] via OPHTHALMIC

## 2021-09-05 MED ORDER — DEXAMETHASONE SODIUM PHOSPHATE 10 MG/ML IJ SOLN
INTRAMUSCULAR | Status: DC | PRN
  Administered 2021-09-05: 10 mg via INTRAVENOUS

## 2021-09-05 MED ORDER — ATROPINE SULFATE 1 % OP SOLN
1.0000 [drp] | OPHTHALMIC | Status: AC | PRN
  Administered 2021-09-05 (×3): 1 [drp] via OPHTHALMIC
  Filled 2021-09-05: qty 5

## 2021-09-05 MED ORDER — BSS IO SOLN
INTRAOCULAR | Status: AC
  Filled 2021-09-05: qty 15

## 2021-09-05 MED ORDER — SODIUM HYALURONATE 10 MG/ML IO SOLUTION
PREFILLED_SYRINGE | INTRAOCULAR | Status: AC
  Filled 2021-09-05: qty 0.85

## 2021-09-05 MED ORDER — PHENYLEPHRINE HCL 10 % OP SOLN
1.0000 [drp] | OPHTHALMIC | Status: AC | PRN
  Administered 2021-09-05 (×3): 1 [drp] via OPHTHALMIC
  Filled 2021-09-05: qty 5

## 2021-09-05 MED ORDER — DORZOLAMIDE HCL-TIMOLOL MAL 2-0.5 % OP SOLN
OPHTHALMIC | Status: AC
  Filled 2021-09-05: qty 10

## 2021-09-05 MED ORDER — LIDOCAINE 2% (20 MG/ML) 5 ML SYRINGE
INTRAMUSCULAR | Status: AC
  Filled 2021-09-05: qty 5

## 2021-09-05 MED ORDER — PROPARACAINE HCL 0.5 % OP SOLN
1.0000 [drp] | OPHTHALMIC | Status: AC | PRN
  Administered 2021-09-05 (×3): 1 [drp] via OPHTHALMIC
  Filled 2021-09-05: qty 15

## 2021-09-05 MED ORDER — ONDANSETRON HCL 4 MG/2ML IJ SOLN
INTRAMUSCULAR | Status: DC | PRN
  Administered 2021-09-05: 4 mg via INTRAVENOUS

## 2021-09-05 MED ORDER — OXYCODONE HCL 5 MG PO TABS: 5.0000 mg | ORAL_TABLET | Freq: Once | ORAL | Status: DC | PRN

## 2021-09-05 MED ORDER — CHLORHEXIDINE GLUCONATE 0.12 % MT SOLN: 15.0000 mL | Freq: Once | OROMUCOSAL | Status: AC

## 2021-09-05 MED ORDER — GATIFLOXACIN 0.5 % OP SOLN
OPHTHALMIC | Status: DC | PRN
  Administered 2021-09-05: 1 [drp] via OPHTHALMIC

## 2021-09-05 MED ORDER — NA CHONDROIT SULF-NA HYALURON 40-30 MG/ML IO SOSY
INTRAOCULAR | Status: AC
  Filled 2021-09-05: qty 0.5

## 2021-09-05 MED ORDER — FENTANYL CITRATE (PF) 100 MCG/2ML IJ SOLN: 25.0000 ug | INTRAMUSCULAR | Status: DC | PRN

## 2021-09-05 MED ORDER — BUPIVACAINE HCL (PF) 0.75 % IJ SOLN
INTRAMUSCULAR | Status: DC | PRN
  Administered 2021-09-05: 5 mL

## 2021-09-05 MED ORDER — CHLORHEXIDINE GLUCONATE 0.12 % MT SOLN
15.0000 mL | Freq: Once | OROMUCOSAL | Status: DC
  Filled 2021-09-05: qty 15

## 2021-09-05 MED ORDER — STERILE WATER FOR INJECTION IJ SOLN
INTRAMUSCULAR | Status: AC
  Filled 2021-09-05: qty 10

## 2021-09-05 MED ORDER — ROCURONIUM BROMIDE 10 MG/ML (PF) SYRINGE
PREFILLED_SYRINGE | INTRAVENOUS | Status: DC | PRN
Start: 2021-09-05 — End: 2021-09-05
  Administered 2021-09-05 (×2): 20 mg via INTRAVENOUS
  Administered 2021-09-05: 70 mg via INTRAVENOUS
  Administered 2021-09-05: 20 mg via INTRAVENOUS

## 2021-09-05 MED ORDER — DORZOLAMIDE HCL-TIMOLOL MAL 2-0.5 % OP SOLN
OPHTHALMIC | Status: DC | PRN
  Administered 2021-09-05: 1 [drp] via OPHTHALMIC

## 2021-09-05 MED ORDER — BRIMONIDINE TARTRATE 0.2 % OP SOLN
OPHTHALMIC | Status: DC | PRN
  Administered 2021-09-05: 1 [drp] via OPHTHALMIC

## 2021-09-05 MED ORDER — LIDOCAINE HCL (PF) 1 % IJ SOLN
INTRAMUSCULAR | Status: DC | PRN
  Administered 2021-09-05: 5 mL

## 2021-09-05 MED ORDER — TROPICAMIDE 1 % OP SOLN
1.0000 [drp] | OPHTHALMIC | Status: AC | PRN
  Administered 2021-09-05 (×3): 1 [drp] via OPHTHALMIC
  Filled 2021-09-05: qty 15

## 2021-09-05 MED ORDER — BSS PLUS IO SOLN
INTRAOCULAR | Status: AC
  Filled 2021-09-05: qty 500

## 2021-09-05 MED ORDER — AMISULPRIDE (ANTIEMETIC) 5 MG/2ML IV SOLN
10.0000 mg | Freq: Once | INTRAVENOUS | Status: AC | PRN
  Administered 2021-09-05: 10 mg via INTRAVENOUS

## 2021-09-05 MED ORDER — POLYMYXIN B SULFATE 500000 UNITS IJ SOLR
INTRAMUSCULAR | Status: AC
  Filled 2021-09-05: qty 10

## 2021-09-05 MED ORDER — BACITRACIN-POLYMYXIN B 500-10000 UNIT/GM OP OINT
TOPICAL_OINTMENT | OPHTHALMIC | Status: DC | PRN
  Administered 2021-09-05: 1 via OPHTHALMIC

## 2021-09-05 MED ORDER — ATROPINE SULFATE 1 % OP SOLN
OPHTHALMIC | Status: AC
  Filled 2021-09-05: qty 5

## 2021-09-05 MED ORDER — TRIAMCINOLONE ACETONIDE 40 MG/ML IJ SUSP
INTRAMUSCULAR | Status: DC | PRN
  Administered 2021-09-05: .5 mL

## 2021-09-05 MED ORDER — LIDOCAINE 2% (20 MG/ML) 5 ML SYRINGE
INTRAMUSCULAR | Status: DC | PRN
  Administered 2021-09-05: 80 mg via INTRAVENOUS

## 2021-09-05 MED ORDER — OXYCODONE HCL 5 MG/5ML PO SOLN: 5.0000 mg | Freq: Once | ORAL | Status: DC | PRN

## 2021-09-05 MED ORDER — BSS IO SOLN
INTRAOCULAR | Status: DC | PRN
  Administered 2021-09-05: 15 mL via INTRAOCULAR

## 2021-09-05 MED ORDER — HYDROCODONE-ACETAMINOPHEN 5-325 MG PO TABS: 1.0000 | ORAL_TABLET | ORAL | 0 refills | Status: AC | PRN

## 2021-09-05 MED ORDER — SODIUM CHLORIDE (PF) 0.9 % IJ SOLN
INTRAMUSCULAR | Status: AC
  Filled 2021-09-05: qty 10

## 2021-09-05 MED ORDER — ROCURONIUM BROMIDE 10 MG/ML (PF) SYRINGE
PREFILLED_SYRINGE | INTRAVENOUS | Status: AC
  Filled 2021-09-05: qty 10

## 2021-09-05 MED ORDER — NA CHONDROIT SULF-NA HYALURON 40-30 MG/ML IO SOSY
INTRAOCULAR | Status: DC | PRN
Start: 2021-09-05 — End: 2021-09-05
  Administered 2021-09-05 (×2): 0.5 mL via INTRAOCULAR

## 2021-09-05 MED ORDER — MIDAZOLAM HCL 2 MG/2ML IJ SOLN
INTRAMUSCULAR | Status: DC | PRN
  Administered 2021-09-05: 2 mg via INTRAVENOUS

## 2021-09-05 MED ORDER — PHENYLEPHRINE 40 MCG/ML (10ML) SYRINGE FOR IV PUSH (FOR BLOOD PRESSURE SUPPORT)
PREFILLED_SYRINGE | INTRAVENOUS | Status: DC | PRN
Start: 2021-09-05 — End: 2021-09-05
  Administered 2021-09-05 (×3): 80 ug via INTRAVENOUS

## 2021-09-05 MED ORDER — PROPOFOL 10 MG/ML IV BOLUS
INTRAVENOUS | Status: DC | PRN
  Administered 2021-09-05: 150 mg via INTRAVENOUS

## 2021-09-05 MED ORDER — DEXAMETHASONE SODIUM PHOSPHATE 10 MG/ML IJ SOLN
INTRAMUSCULAR | Status: AC
  Filled 2021-09-05: qty 1

## 2021-09-05 MED ORDER — SUGAMMADEX SODIUM 200 MG/2ML IV SOLN
INTRAVENOUS | Status: DC | PRN
Start: 2021-09-05 — End: 2021-09-05
  Administered 2021-09-05: 200 mg via INTRAVENOUS

## 2021-09-05 SURGICAL SUPPLY — 77 items
APPLICATOR COTTON TIP 6 STRL (MISCELLANEOUS) ×8 IMPLANT
APPLICATOR COTTON TIP 6IN STRL (MISCELLANEOUS) ×12
BAG COUNTER SPONGE SURGICOUNT (BAG) ×1 IMPLANT
BAND SCLERAL BUCKLING TYPE 41 (Ophthalmic Related) ×2 IMPLANT
BAND WRIST GAS GREEN (MISCELLANEOUS) ×1 IMPLANT
BETADINE 5% OPHTHALMIC (OPHTHALMIC) ×4 IMPLANT
BLADE EYE CATARACT 19 1.4 BEAV (BLADE) IMPLANT
BLADE MVR KNIFE 19G (BLADE) IMPLANT
BNDG EYE OVAL (GAUZE/BANDAGES/DRESSINGS) ×2 IMPLANT
CABLE BIPOLOR RESECTION CORD (MISCELLANEOUS) ×2 IMPLANT
CANNULA DUAL BORE 23G (CANNULA) IMPLANT
CANNULA DUALBORE 25G (CANNULA) ×2 IMPLANT
CANNULA FLEX TIP 25G (CANNULA) ×3 IMPLANT
CANNULA VLV SOFT TIP 25G (OPHTHALMIC) IMPLANT
CANNULA VLV SOFT TIP 25GA (OPHTHALMIC) IMPLANT
COVER SURGICAL LIGHT HANDLE (MISCELLANEOUS) ×3 IMPLANT
DRAPE MICROSCOPE LEICA 46X105 (MISCELLANEOUS) ×3 IMPLANT
ERASER HMR WETFIELD 23G BP (MISCELLANEOUS) IMPLANT
FILTER BLUE MILLIPORE (MISCELLANEOUS) ×2 IMPLANT
FILTER STRAW FLUID ASPIR (MISCELLANEOUS) IMPLANT
FORCEPS GRIESHABER ILM 25G A (INSTRUMENTS) IMPLANT
GAS AUTO FILL CONSTEL (OPHTHALMIC) ×3
GAS AUTO FILL CONSTELLATION (OPHTHALMIC) ×1 IMPLANT
GAS WRIST BAND GREEN (MISCELLANEOUS) ×3
GLOVE SURG ENC MOIS LTX SZ7.5 (GLOVE) ×2 IMPLANT
GLOVE SURG ENC TEXT LTX SZ7 (GLOVE) ×1 IMPLANT
GOWN STRL REUS W/ TWL LRG LVL3 (GOWN DISPOSABLE) ×4 IMPLANT
GOWN STRL REUS W/ TWL XL LVL3 (GOWN DISPOSABLE) ×2 IMPLANT
GOWN STRL REUS W/TWL LRG LVL3 (GOWN DISPOSABLE) ×6
GOWN STRL REUS W/TWL XL LVL3 (GOWN DISPOSABLE) ×3
KIT BASIN OR (CUSTOM PROCEDURE TRAY) ×3 IMPLANT
KIT PERFLUORON PROCEDURE 5ML (MISCELLANEOUS) ×2 IMPLANT
KNIFE CRESCENT 1.75 EDGEAHEAD (BLADE) IMPLANT
KNIFE GRIESHABER SHARP 2.5MM (MISCELLANEOUS) ×3 IMPLANT
LENS BIOM SUPER VIEW SET DISP (MISCELLANEOUS) IMPLANT
LENS VITRECTOMY FLAT OCLR DISP (MISCELLANEOUS) IMPLANT
MICROPICK 25G (MISCELLANEOUS)
NDL 18GX1X1/2 (RX/OR ONLY) (NEEDLE) ×1 IMPLANT
NDL 25GX 5/8IN NON SAFETY (NEEDLE) ×4 IMPLANT
NDL HYPO 30X.5 LL (NEEDLE) ×2 IMPLANT
NEEDLE 18GX1X1/2 (RX/OR ONLY) (NEEDLE) ×9 IMPLANT
NEEDLE 25GX 5/8IN NON SAFETY (NEEDLE) IMPLANT
NEEDLE HYPO 30X.5 LL (NEEDLE) IMPLANT
NS IRRIG 1000ML POUR BTL (IV SOLUTION) ×1 IMPLANT
OPHTHALMIC BETADINE 5% (OPHTHALMIC) ×6
PACK VITRECTOMY CUSTOM (CUSTOM PROCEDURE TRAY) ×3 IMPLANT
PAD ARMBOARD 7.5X6 YLW CONV (MISCELLANEOUS) ×6 IMPLANT
PAK PIK VITRECTOMY CVS 25GA (OPHTHALMIC) ×3 IMPLANT
PIC ILLUMINATED 25G (OPHTHALMIC)
PICK MICROPICK 25G (MISCELLANEOUS) IMPLANT
PIK ILLUMINATED 25G (OPHTHALMIC) IMPLANT
PROBE DIATHERMY DSP 27GA (MISCELLANEOUS) IMPLANT
PROBE ENDO DIATHERMY 25G (MISCELLANEOUS) ×2 IMPLANT
PROBE LASER ILLUM FLEX CVD 25G (OPHTHALMIC) ×2 IMPLANT
REPL STRA BRUSH NDL (NEEDLE) IMPLANT
REPL STRA BRUSH NEEDLE (NEEDLE) IMPLANT
RESERVOIR BACK FLUSH (MISCELLANEOUS) IMPLANT
SCRAPER DIAMOND 25GA (OPHTHALMIC RELATED) IMPLANT
SHIELD EYE LENSE ONLY DISP (GAUZE/BANDAGES/DRESSINGS) ×2 IMPLANT
SLEEVE SCLERAL BUCK TYPE 70 (Ophthalmic Related) ×3 IMPLANT
STRIP CLOSURE SKIN 1/2X4 (GAUZE/BANDAGES/DRESSINGS) ×2 IMPLANT
SUT ETHILON 5.0 S-24 (SUTURE) ×3 IMPLANT
SUT ETHILON 9 0 TG140 8 (SUTURE) IMPLANT
SUT SILK 2 0 (SUTURE) ×3
SUT SILK 2 0 TIES 17X18 (SUTURE)
SUT SILK 2-0 18XBRD TIE 12 (SUTURE) ×2 IMPLANT
SUT SILK 2-0 18XBRD TIE BLK (SUTURE) ×1 IMPLANT
SUT VICRYL 7 0 TG140 8 (SUTURE) ×3 IMPLANT
SYR 10ML LL (SYRINGE) ×5 IMPLANT
SYR 20ML LL LF (SYRINGE) ×1 IMPLANT
SYR 5ML LL (SYRINGE) ×1 IMPLANT
SYR BULB EAR ULCER 3OZ GRN STR (SYRINGE) ×1 IMPLANT
SYR TB 1ML LUER SLIP (SYRINGE) ×3 IMPLANT
TOWEL GREEN STERILE FF (TOWEL DISPOSABLE) ×3 IMPLANT
TRAY FOLEY W/BAG SLVR 14FR (SET/KITS/TRAYS/PACK) ×3 IMPLANT
TUBING HIGH PRESS EXTEN 6IN (TUBING) IMPLANT
WATER STERILE IRR 1000ML POUR (IV SOLUTION) ×3 IMPLANT

## 2021-09-05 NOTE — Discharge Instructions (Signed)
POSTOPERATIVE INSTRUCTIONS  Your doctor has performed vitreoretinal surgery on you at [place].  - Keep eye patched and shielded until seen by Dr. Rylin Saez 8 AM tomorrow in clinic - Do not use drops until return - FACE DOWN POSITIONING WHILE AWAKE - Sleep with belly down or on right side, avoid laying flat on back.    - No strenuous bending, stooping or lifting.  - You may not drive until further notice.  - If your doctor used a gas bubble in your eye during the procedure he will advise you on postoperative positioning. If you have a gas bubble you will be wearing a green bracelet that was applied in the operating room. The green bracelet should stay on as long as the gas bubble is in your eye. While the gas bubble is present you should not fly in an airplane. If you require general anesthesia while the gas bubble is present you must notify your anesthesiologist that an intraocular gas bubble is present so he can take the appropriate precautions.  - Tylenol or any other over-the-counter pain reliever can be used according to your doctor. If more pain medicine is required, your doctor will have a prescription for you.  - You may read, go up and down stairs, and watch television.     Weber Monnier, M.D., Ph.D.  

## 2021-09-05 NOTE — Anesthesia Preprocedure Evaluation (Addendum)
Anesthesia Evaluation  ?Patient identified by MRN, date of birth, ID band ?Patient awake ? ? ? ?Reviewed: ?Allergy & Precautions, NPO status , Patient's Chart, lab work & pertinent test results ? ?Airway ?Mallampati: II ? ?TM Distance: >3 FB ?Neck ROM: Full ? ? ? Dental ? ?(+) Edentulous Upper, Edentulous Lower, Dental Advisory Given ?  ?Pulmonary ?asthma , former smoker,  ?  ?Pulmonary exam normal ?breath sounds clear to auscultation ? ? ? ? ? ? Cardiovascular ?hypertension, Pt. on medications ?Normal cardiovascular exam ?Rhythm:Regular Rate:Normal ? ? ?  ?Neuro/Psych ?negative neurological ROS ? negative psych ROS  ? GI/Hepatic ?Neg liver ROS, GERD  ,  ?Endo/Other  ?Morbid obesityhyperlipidemia ? Renal/GU ?negative Renal ROS  ? ?  ?Musculoskeletal ? ?(+) Arthritis ,  ? Abdominal ?(+) + obese,   ?Peds ? Hematology ?negative hematology ROS ?(+)   ?Anesthesia Other Findings ? ? Reproductive/Obstetrics ?negative OB ROS ? ?  ? ? ? ? ? ? ? ? ? ? ? ? ? ?  ?  ? ? ? ? ? ? ?Anesthesia Physical ?Anesthesia Plan ? ?ASA: 3 ? ?Anesthesia Plan: General  ? ?Post-op Pain Management: Minimal or no pain anticipated  ? ?Induction: Intravenous ? ?PONV Risk Score and Plan: 3 and Treatment may vary due to age or medical condition, Ondansetron, Dexamethasone and Midazolam ? ?Airway Management Planned: Oral ETT ? ?Additional Equipment: None ? ?Intra-op Plan:  ? ?Post-operative Plan: Extubation in OR ? ?Informed Consent: I have reviewed the patients History and Physical, chart, labs and discussed the procedure including the risks, benefits and alternatives for the proposed anesthesia with the patient or authorized representative who has indicated his/her understanding and acceptance.  ? ? ? ?Dental advisory given ? ?Plan Discussed with: CRNA ? ?Anesthesia Plan Comments:   ? ? ? ? ?Anesthesia Quick Evaluation ? ?

## 2021-09-05 NOTE — Anesthesia Postprocedure Evaluation (Signed)
Anesthesia Post Note ? ?Patient: Alicia Miranda ? ?Procedure(s) Performed: SCLERAL BUCKLE (Left: Eye) ?PARS PLANA VITRECTOMY WITH 25 GAUGE (Left: Eye) ?PHOTOCOAGULATION WITH LASER (Left: Eye) ?INSERTION OF GAS - C3F8 (Left: Eye) ?GAS/FLUID EXCHANGE (Left: Eye) ?PERFLUORON INJECTION (Left: Eye) ? ?  ? ?Patient location during evaluation: PACU ?Anesthesia Type: General ?Level of consciousness: awake and alert ?Pain management: pain level controlled ?Vital Signs Assessment: post-procedure vital signs reviewed and stable ?Respiratory status: spontaneous breathing, nonlabored ventilation, respiratory function stable and patient connected to nasal cannula oxygen ?Cardiovascular status: blood pressure returned to baseline and stable ?Postop Assessment: no apparent nausea or vomiting ?Anesthetic complications: no ? ? ?No notable events documented. ? ?Last Vitals:  ?Vitals:  ? 09/05/21 1103  ?BP: (!) 158/91  ?Pulse: (!) 101  ?Resp: 18  ?Temp: 36.8 ?C  ?SpO2: 94%  ?  ?Last Pain:  ?Vitals:  ? 09/05/21 1110  ?TempSrc:   ?PainSc: 0-No pain  ? ? ?  ?  ?  ?  ?  ?  ? ?Madison Direnzo S ? ? ? ? ?

## 2021-09-05 NOTE — Transfer of Care (Signed)
Immediate Anesthesia Transfer of Care Note ? ?Patient: Alicia Miranda ? ?Procedure(s) Performed: SCLERAL BUCKLE (Left: Eye) ?PARS PLANA VITRECTOMY WITH 25 GAUGE (Left: Eye) ?PHOTOCOAGULATION WITH LASER (Left: Eye) ?INSERTION OF GAS - C3F8 (Left: Eye) ?GAS/FLUID EXCHANGE (Left: Eye) ?PERFLUORON INJECTION (Left: Eye) ? ?Patient Location: PACU ? ?Anesthesia Type:General ? ?Level of Consciousness: drowsy and patient cooperative ? ?Airway & Oxygen Therapy: Patient Spontanous Breathing and Patient connected to nasal cannula oxygen ? ?Post-op Assessment: Report given to RN, Post -op Vital signs reviewed and stable and Patient moving all extremities ? ?Post vital signs: Reviewed and stable ? ?Last Vitals:  ?Vitals Value Taken Time  ?BP 122/84 09/05/21 1707  ?Temp    ?Pulse 91 09/05/21 1710  ?Resp 13 09/05/21 1710  ?SpO2 94 % 09/05/21 1710  ?Vitals shown include unvalidated device data. ? ?Last Pain:  ?Vitals:  ? 09/05/21 1110  ?TempSrc:   ?PainSc: 0-No pain  ?   ? ?  ? ?Complications: No notable events documented. ?

## 2021-09-05 NOTE — Anesthesia Procedure Notes (Signed)
Procedure Name: Intubation ?Date/Time: 09/05/2021 1:33 PM ?Performed by: Ezequiel Kayser, CRNA ?Pre-anesthesia Checklist: Patient identified, Emergency Drugs available, Suction available and Patient being monitored ?Patient Re-evaluated:Patient Re-evaluated prior to induction ?Oxygen Delivery Method: Circle System Utilized ?Preoxygenation: Pre-oxygenation with 100% oxygen ?Induction Type: IV induction ?Ventilation: Mask ventilation without difficulty and Oral airway inserted - appropriate to patient size ?Laryngoscope Size: Mac and 3 ?Grade View: Grade I ?Tube type: Oral ?Tube size: 7.0 mm ?Number of attempts: 1 ?Airway Equipment and Method: Stylet and Oral airway ?Placement Confirmation: ETT inserted through vocal cords under direct vision, positive ETCO2 and breath sounds checked- equal and bilateral ?Secured at: 20 cm ?Tube secured with: Tape ?Dental Injury: Teeth and Oropharynx as per pre-operative assessment  ? ? ? ? ?

## 2021-09-05 NOTE — Op Note (Signed)
Date of procedure: 03.23.23 ?  ?Surgeon: [name], MD, PhD ?  ?Assistant: Ernest Mallick, Ophthalmic Assistant  ?  ?Pre-operative Diagnosis: Macula involving Rhegmatogenous Retinal Detachment, Left Eye ?  ?Post-operative diagnosis: Macula involving Rhegmatogenous Retinal Detachment, Left Eye ?  ?Anesthesia: GETA ?  ?Procedures: ?1)     Scleral Buckle, Left Eye ?2)     25 gauge pars plana vitrectomy, Left Eye CPT [phone] ?3)     Perfluorocarbon injection ?4)     Fluid-air exchange, Left Eye ?5)     Endolaser, Left Eye ?5)     Injection of 14% C3F8 gas ?  ?Complications: none ?Estimated blood loss: minimal ?Specimens: none ?  ?Brief history:   ?The patient has a history of decreased vision in the affected left eye, and on examination, was noted to have a macula-involving retinal detachment, affecting activities of daily living.  The risks, benefits, and alternatives were explained to the patient, including pain, bleeding, infection, loss of vision, double vision, droopy eyelids, and need for more surgeries.  Informed consent was obtained from the patient and placed in the chart.   ?  ?Procedure: ?            The patient was brought to the preoperative holding area where the correct eye was confirmed and marked. The patient was then brought to the operating room where general endotracheal anesthesia was induced. A secondary time-out was performed to identify the correct patient, eyes, procedures, and any allergies. The left eye was prepped and draped in the usual sterile ophthalmic fashion followed by placement of a lid speculum. ?            A 360? conjunctival peritomy was created using Westcott scissors and 0.12 forceps. Each of the four quadrants between the rectus muscles was dissected using Stevens scissors to detach Tenon?s attachments from the globe. Each of the four rectus muscles was isolated on a muscle hook and slung using 2-0 Silk suture in the usual standard fashion. Each of the four quadrants between the  rectus muscles was inspected and there were noted to be no areas of scleral thinning. A #96 silicone band was then brought onto the field and was threaded under each rectus muscle. The band was then loosely secured using a #70 Watzke sleeve in the superotemporal quadrant. The band was then sutured to the sclera in each quadrant using 5-0 nylon sutures passed partial thickness through the sclera in a horizontal mattress fashion. The scleral buckle was then tightened to the appropriate height with two locking needle drivers. Attention was then turned to the vitrectomy portion of the procedure.  ?            A 25 gauge trocar was placed in the inferotemporal quadrant in a beveled fashion. A 4 mm infusion cannula was placed through this trocar, and the infusion cannula was confirmed in the vitreous cavity with no incarceration of retina or choroid prior to turning it on. Two additional 25 gauge trocars were placed in the superonasal and superotemporal quadrants (2 and 10 oclock, respectively) in a similar beveled fashion. At this time, a standard three-port pars plana vitrectomy was performed using the light pipe, the cutter, and the BIOM viewing system. A thorough anterior, core and peripheral vitreous dissection was performed. A posterior vitreous detachment was confirmed over the optic nerve. There was a bullous superior and temporal retinal detachment involving the macula and fovea, spanning 12 to 4 oclock. There was a single retinal tear at the  1 oclock position. ?            Traction was removed from the retinal breaks. The break were trimmed using the cutter to smooth the edges and marked with diathermy. Perfluoron was injected to push the subretinal fluid anterior to the scleral buckle. Under perfluoron, endolaser was applied over the posterior edge of the scleral buckle and just posterior to the buckle. Then, a complete fluid-air exchange was performed with a soft tip extrusion cannula over the break, then  posteriorly to remove the perfluoron. After completion of these maneuvers, the retina was flat over the macula and over the scleral buckle. Under air, endolaser was applied to all the breaks and over and anterior to the scleral buckle to the ora. ?            At this time, the buckle height was confirmed and the buckle was finalized by trimming the band ends. The superotemporal trocar was removed and sutured with 7-0 vicryl in an interrupted fashion.  A complete air to 14% C3F8 gas exchange was performed through the infusion cannula and vented through the superonasal trocar using the extrusion cannula. The superonasal trocar and infusion cannula and associated trocar were then removed and sutured with 7-0 vicryl in an interrupted fashion. Kefzol + polymixin irrigation was then used over the buckle. A subtenon's block containing 0.75% marcaine and 2% lidocaine was administered.  ?            The conjunctiva was closed with 7-0 vicryl sutures. The eye's intraocular pressure was confirmed to be at a physiologic level by digital palpation. Subconjunctival injections of Antibiotic and kenalog were administered. The lid speculum and drapes were removed. Drops of an antibiotic, antihypertensives, and steroid were given. Copious antibiotic ointment was instilled into the eye. The eye was patched and shielded. The patient tolerated the procedure well without any intraoperative or immediate postoperative complications. The patient was taken to the recovery room in good condition. The patient was instructed to maintain a strict face-down position and will be seen by Dr. Coralyn Pear tomorrow morning in clinic. ? ?

## 2021-09-05 NOTE — Interval H&P Note (Signed)
History and Physical Interval Note: ? ?09/05/2021 ?12:52 PM ? ?Alicia Miranda  has presented today for surgery, with the diagnosis of left eye, retinal detachment.  The various methods of treatment have been discussed with the patient and family. After consideration of risks, benefits and other options for treatment, the patient has consented to  Procedure(s): ?VITRECTOMY 25 GAUGE WITH SCLERAL BUCKLE (Left) as a surgical intervention.  The patient's history has been reviewed, patient examined, no change in status, stable for surgery.  I have reviewed the patient's chart and labs.  Questions were answered to the patient's satisfaction.   ? ? ?[name] ? ? ?

## 2021-09-05 NOTE — Brief Op Note (Signed)
09/05/2021 ? ?5:15 PM ? ?PATIENT:  Alicia Miranda  65 y.o. female ? ?PRE-OPERATIVE DIAGNOSIS:  left eye, retinal detachment ? ?POST-OPERATIVE DIAGNOSIS:  left eye, retinal detachment ? ?PROCEDURE:  Procedure(s): ?SCLERAL BUCKLE (Left) ?PARS PLANA VITRECTOMY WITH 25 GAUGE (Left) ?PHOTOCOAGULATION WITH LASER (Left) ?INSERTION OF GAS - C3F8 (Left) ?GAS/FLUID EXCHANGE (Left) ?PERFLUORON INJECTION (Left) ? ?SURGEON:  Surgeon(s) and Role: ?   [name], MD - Primary ? ?ASSISTANTS: Ernest Mallick, Ophthalmic Assistant   ? ?ANESTHESIA:   local and general ? ?EBL:  minimal  ? ?BLOOD ADMINISTERED:none ? ?DRAINS: none  ? ?LOCAL MEDICATIONS USED:  BUPIVICAINE , LIDOCAINE , and Amount: 10 ml ? ?SPECIMEN:  No Specimen ? ?DISPOSITION OF SPECIMEN:  N/A ? ?COUNTS:  YES ? ?TOURNIQUET:  * No tourniquets in log * ? ?DICTATION: .Note written in EPIC ? ?PLAN OF CARE: Discharge to home after PACU ? ?PATIENT DISPOSITION:  PACU - hemodynamically stable. ?  ?Delay start of Pharmacological VTE agent (>24hrs) due to surgical blood loss or risk of bleeding: not applicable ? ?

## 2021-09-06 ENCOUNTER — Encounter (INDEPENDENT_AMBULATORY_CARE_PROVIDER_SITE_OTHER): Payer: Self-pay | Admitting: Ophthalmology

## 2021-09-06 ENCOUNTER — Ambulatory Visit (INDEPENDENT_AMBULATORY_CARE_PROVIDER_SITE_OTHER): Payer: Medicare HMO | Admitting: Ophthalmology

## 2021-09-06 DIAGNOSIS — I10 Essential (primary) hypertension: Secondary | ICD-10-CM

## 2021-09-06 DIAGNOSIS — H33311 Horseshoe tear of retina without detachment, right eye: Secondary | ICD-10-CM

## 2021-09-06 DIAGNOSIS — H3322 Serous retinal detachment, left eye: Secondary | ICD-10-CM

## 2021-09-06 DIAGNOSIS — H35033 Hypertensive retinopathy, bilateral: Secondary | ICD-10-CM

## 2021-09-06 DIAGNOSIS — H25813 Combined forms of age-related cataract, bilateral: Secondary | ICD-10-CM

## 2021-09-06 NOTE — Progress Notes (Signed)
?[facility] Note ? ?09/06/2021 ? ?  ? ?CHIEF COMPLAINT ?Patient presents for Retina Follow Up ? ? ?HISTORY OF PRESENT ILLNESS: ?Alicia Miranda is a 66 y.o. female who presents to the clinic today for:  ? ?HPI   ? ? Retina Follow Up   ?Patient presents with  Other.  In left eye.  This started 1 day ago.  I, the attending physician,  performed the HPI with the patient and updated documentation appropriately. ? ?  ?  ? ? Comments   ?Patient here for 1 day follow up for POV OS. Patient states had pain last night and this am. This am got sick in stomach.  ? ?  ?  ?Last edited by [name], MD on 09/06/2021  8:34 AM.  ?  ?Patient states referred by Dr. Madilyn Hook for RD OS. Patient reports having floaters OU, but not new. Floaters no worse than before. Patient denies flashes. Sudden onset of decrease in peripheral vision OS about 2 days ago.  ? ?Referring physician: ?Antony Contras, MD ?[address] ?[address] ?[place],  [place] [address] ? ?HISTORICAL INFORMATION:  ? ?Selected notes from the [facility] ?Referred by Dr. Madilyn Hook for possible RD OS ?LEE: 03/16/02023 ?Ocular Hx-  ?PMH- HTN ?  ? ?CURRENT MEDICATIONS: ?Current Outpatient Medications (Ophthalmic Drugs)  ?Medication Sig  ? LOTEPREDNOL ETABONATE OP Place 1 drop into the right eye 4 (four) times daily.  ? ?No current facility-administered medications for this visit. (Ophthalmic Drugs)  ? ?Current Outpatient Medications (Other)  ?Medication Sig  ? APPLE CIDER VINEGAR PO Take 2 tablets by mouth daily. gummies  ? Cholecalciferol (VITAMIN D3) 10 MCG (400 UNIT) tablet Take 400 Units by mouth daily.  ? CINNAMON PO Take 1,000 mg by mouth every other day.  ? COLLAGEN PO Take 3 capsules by mouth daily.  ? Denosumab (PROLIA Carver) Inject 60 mg into the skin every 6 (six) months.  ? FEROSUL 325 (65 Fe) MG tablet Take 325 mg by mouth daily.  ? fexofenadine (ALLEGRA) 180 MG tablet Take 180 mg by mouth daily.  ? fluticasone (FLONASE) 50  MCG/ACT nasal spray Place 1 spray into both nostrils daily.  ? furosemide (LASIX) 40 MG tablet Take 40 mg by mouth 2 (two) times daily.  ? Glucosamine HCl (GLUCOSAMINE PO) Take 2 tablets by mouth daily.  ? HYDROcodone-acetaminophen (NORCO/VICODIN) 5-325 MG tablet Take 1 tablet by mouth every 4 (four) hours as needed for moderate pain.  ? losartan (COZAAR) 50 MG tablet Take 100 mg by mouth daily.  ? Magnesium 400 MG CAPS Take 400 mg by mouth daily.  ? Melatonin 5 MG CAPS Take 10 mg by mouth at bedtime.  ? pantoprazole (PROTONIX) 40 MG tablet Take 40 mg by mouth 2 (two) times daily.  ? rosuvastatin (CRESTOR) 20 MG tablet Take 20 mg by mouth daily.  ? TURMERIC PO Take 2 tablets by mouth daily.  ? ?No current facility-administered medications for this visit. (Other)  ? ?REVIEW OF SYSTEMS: ?ROS   ?Positive for: Musculoskeletal, Cardiovascular, Eyes, Respiratory ?Negative for: Constitutional, Gastrointestinal, Neurological, Skin, Genitourinary, HENT, Endocrine, Psychiatric, Allergic/Imm, Heme/Lymph ?Last edited by Theodore Demark, COA on 09/06/2021  8:25 AM.  ?  ? ?ALLERGIES ?Allergies  ?Allergen Reactions  ? Codeine Itching  ? ?PAST MEDICAL HISTORY ?Past Medical History:  ?Diagnosis Date  ? Allergy   ? Arthritis   ? legs  ? Asthma   ? GERD (gastroesophageal reflux  disease)   ? Hyperlipidemia   ? Hypertension   ? ?Past Surgical History:  ?Procedure Laterality Date  ? ABDOMINAL HYSTERECTOMY    ? CHOLECYSTECTOMY    ? COLONOSCOPY    ? GAS INSERTION Left 09/05/2021  ? Procedure: INSERTION OF GAS - C3F8;  Surgeon: [name], MD;  Location: [facility];  Service: Ophthalmology;  Laterality: Left;  ? GAS/FLUID EXCHANGE Left 09/05/2021  ? Procedure: GAS/FLUID EXCHANGE;  Surgeon: [name], MD;  Location: [facility];  Service: Ophthalmology;  Laterality: Left;  ? PARS PLANA VITRECTOMY Left 09/05/2021  ? Procedure: PARS PLANA VITRECTOMY WITH 25 GAUGE;  Surgeon: [name], MD;  Location: [facility];  Service: Ophthalmology;  Laterality:  Left;  ? PERFLUORONE INJECTION Left 09/05/2021  ? Procedure: PERFLUORON INJECTION;  Surgeon: [name], MD;  Location: [facility];  Service: Ophthalmology;  Laterality: Left;  ? PHOTOCOAGULATION WITH LASER Left 09/05/2021  ? Procedure: PHOTOCOAGULATION WITH LASER;  Surgeon: [name], MD;  Location: [facility];  Service: Ophthalmology;  Laterality: Left;  ? SCLERAL BUCKLE Left 09/05/2021  ? Procedure: SCLERAL BUCKLE;  Surgeon: [name], MD;  Location: [facility];  Service: Ophthalmology;  Laterality: Left;  ? UPPER GASTROINTESTINAL ENDOSCOPY    ? ?FAMILY HISTORY ?Family History  ?Problem Relation Age of Onset  ? Retinal detachment Sister   ? Diabetes Sister   ? Glaucoma Brother   ? Diabetes Brother   ? Colon cancer Brother   ?     dx at 62  ? Esophageal cancer Neg Hx   ? Rectal cancer Neg Hx   ? Stomach cancer Neg Hx   ? ?SOCIAL HISTORY ?Social History  ? ?Tobacco Use  ? Smoking status: Former  ? Smokeless tobacco: Never  ? Tobacco comments:  ?  quit smoking in 2002 or 2003  ?Vaping Use  ? Vaping Use: Never used  ?Substance Use Topics  ? Alcohol use: Yes  ?  Comment: very rarely  ? Drug use: No  ?  ? ?  ?OPHTHALMIC EXAM: ? ?Base Eye Exam   ? ? Visual Acuity (Snellen - Linear)   ? ?   Right Left  ? Dist [place] 20/50 CF at 2'  ? Dist ph [place] 20/25 -1   ?Didnt't bring glasses. ? ?  ?  ? ? Tonometry (Tonopen, 8:22 AM)   ? ?   Right Left  ? Pressure def 22  ? ?  ?  ? ? Extraocular Movement   ? ?   Right Left  ?  Full Full  ? ?  ?  ? ? Neuro/Psych   ? ? Oriented x3: Yes  ? Mood/Affect: Normal  ? ?  ?  ? ? Dilation   ? ? Left eye: 1.0% Mydriacyl, 2.5% Phenylephrine @ 8:21 AM  ? ?  ?  ? ?  ? ?Slit Lamp and Fundus Exam   ? ? External Exam   ? ?   Right Left  ? External  Periorbital edema  ? ?  ?  ? ? Slit Lamp Exam   ? ?   Right Left  ? Lids/Lashes Dermatochalasis - upper lid Ecchymosis  ? Conjunctiva/Sclera white and quiet Subconjunctival hemorrhage, sutures intact  ? Cornea 1+ inferior PEE Central Epithelial defect, trace tear film  debris  ? Anterior Chamber deep and clear narrow temporal angles  ? Iris round and dilated round and dilated  ? Lens 2+ NS, 2+ cortical 2+ NS, 2+ cortical, 2+PC feathering  ? Anterior Vitreous  syneresis, asteroid hyalosis, PVD post vitrectomy, good gas fill  ? ?  ?  ? ? Fundus Exam   ? ?   Right Left  ? Disc  perfused  ? C/D Ratio 0.5 0.5  ? Macula  flat under gas  ? Vessels  attenuated, tortuous  ? Periphery  Retina attached over buckle, good buckle height, good laser over buckle and around tears, PRE-OP: small tear with fibrosis @ 0200, bullous, temporal RD from 1230 to 0430  ? ?  ?  ? ?  ? ?Refraction   ? ? Wearing Rx   ? ?   Sphere Cylinder Axis Add  ? Right +1.00 +1.00 130 +2.75  ? Left +1.25 +0.75 017 +2.75  ? ?  ?  ? ?  ? ?IMAGING AND PROCEDURES  ?Imaging and Procedures for 09/06/2021 ? ? ?  ?  ? ?  ?ASSESSMENT/PLAN: ? ?  ICD-10-CM   ?1. Left retinal detachment  H33.22   ?  ?2. Retinal tear of right eye  H33.311   ?  ?3. Essential hypertension  I10   ?  ?4. Hypertensive retinopathy of both eyes  H35.033   ?  ?5. Combined forms of age-related cataract of both eyes  H25.813   ?  ? ?1. Rhegmatogenous retinal detachment with retinal hole, left eye ?- bullous, temporal, mac off detachment, onset of foveal involvement Tuesday, 03.14.23 by history ?- detached from 1230 to 430 oclock, fovea off, small tear at 0200 ?- s/p POD1 s/p SBP + PPV/PFO/EL/FAX/14% C3F8 OS, 03.23.2023 ?            - doing well this morning ?            - retina attached and in good position -- good buckle height and laser around breaks ?            - IOP mildly elevated at 22 ?            - start   PF 4x/day OS  ?                        zymaxid QID OS  ?                        Atropine BID OS  ?                        Cosopt BID OS ?                        PSO ung QID OS ?            - cont face down positioning x3 days; avoid laying flat on back  ?            - eye shield when sleeping  ?            - post op drop and positioning instructions  reviewed  ?            - tylenol/ibuprofen for pain  ?            - Rx given for breakthrough pain ? - f/u next Thursday - POV OS ? ?2. Retinal hole OD  ?- operculated retinal hole located at 1030 with

## 2021-09-09 NOTE — Progress Notes (Signed)
?[facility] Note ? ?09/12/2021 ? ?  ? ?CHIEF COMPLAINT ?Patient presents for Retina Follow Up ? ? ?HISTORY OF PRESENT ILLNESS: ?Alicia Miranda is a 66 y.o. female who presents to the clinic today for:  ? ?HPI   ? ? Retina Follow Up   ?Patient presents with  Other.  In left eye.  This started 6 days ago.  I, the attending physician,  performed the HPI with the patient and updated documentation appropriately. ? ?  ?  ? ? Comments   ?Patient here for 6 days retina follow up for RD OS. Patient states vision the same. Can see a little bit. Can't hardly open eye. No eye pain. Has headaches not eye pain. Feels scratchy. ? ?  ?  ?Last edited by [name], MD on 09/16/2021  2:20 AM.  ?  ? ?Patient states everything is going well, she has not used her drops this morning ? ?Referring physician: ?Antony Contras, MD ?[address] ?[address] ?[place],  [place] [address] ? ?HISTORICAL INFORMATION:  ? ?Selected notes from the [facility] ?Referred by Dr. Madilyn Hook for possible RD OS ?LEE: 03/16/02023 ?Ocular Hx-  ?PMH- HTN ?  ? ?CURRENT MEDICATIONS: ?Current Outpatient Medications (Ophthalmic Drugs)  ?Medication Sig  ? LOTEPREDNOL ETABONATE OP Place 1 drop into the right eye 4 (four) times daily.  ? prednisoLONE acetate (PRED FORTE) 1 % ophthalmic suspension Place 1 drop into the left eye 4 (four) times daily.  ? bacitracin-polymyxin b (POLYSPORIN) ophthalmic ointment Place into the left eye 4 (four) times daily for 10 days. Place a 1/2 inch ribbon of ointment into the lower eyelid.  ? ?No current facility-administered medications for this visit. (Ophthalmic Drugs)  ? ?Current Outpatient Medications (Other)  ?Medication Sig  ? APPLE CIDER VINEGAR PO Take 2 tablets by mouth daily. gummies  ? Cholecalciferol (VITAMIN D3) 10 MCG (400 UNIT) tablet Take 400 Units by mouth daily.  ? CINNAMON PO Take 1,000 mg by mouth every other day.  ? COLLAGEN PO Take 3 capsules by mouth daily.  ? Denosumab  (PROLIA [place]) Inject 60 mg into the skin every 6 (six) months.  ? FEROSUL 325 (65 Fe) MG tablet Take 325 mg by mouth daily.  ? fexofenadine (ALLEGRA) 180 MG tablet Take 180 mg by mouth daily.  ? fluticasone (FLONASE) 50 MCG/ACT nasal spray Place 1 spray into both nostrils daily.  ? furosemide (LASIX) 40 MG tablet Take 40 mg by mouth 2 (two) times daily.  ? Glucosamine HCl (GLUCOSAMINE PO) Take 2 tablets by mouth daily.  ? HYDROcodone-acetaminophen (NORCO/VICODIN) 5-325 MG tablet Take 1 tablet by mouth every 4 (four) hours as needed for moderate pain.  ? losartan (COZAAR) 50 MG tablet Take 100 mg by mouth daily.  ? Magnesium 400 MG CAPS Take 400 mg by mouth daily.  ? Melatonin 5 MG CAPS Take 10 mg by mouth at bedtime.  ? pantoprazole (PROTONIX) 40 MG tablet Take 40 mg by mouth 2 (two) times daily.  ? rosuvastatin (CRESTOR) 20 MG tablet Take 20 mg by mouth daily.  ? TURMERIC PO Take 2 tablets by mouth daily.  ? ?No current facility-administered medications for this visit. (Other)  ? ?REVIEW OF SYSTEMS: ?ROS   ?Positive for: Musculoskeletal, Cardiovascular, Eyes, Respiratory ?Negative for: Constitutional, Gastrointestinal, Neurological, Skin, Genitourinary, HENT, Endocrine, Psychiatric, Allergic/Imm, Heme/Lymph ?Last edited by Theodore Demark, COA on 09/12/2021  8:10 AM.  ?  ? ? ?ALLERGIES ?Allergies  ?  Allergen Reactions  ? Codeine Itching  ? ?PAST MEDICAL HISTORY ?Past Medical History:  ?Diagnosis Date  ? Allergy   ? Arthritis   ? legs  ? Asthma   ? GERD (gastroesophageal reflux disease)   ? Hyperlipidemia   ? Hypertension   ? ?Past Surgical History:  ?Procedure Laterality Date  ? ABDOMINAL HYSTERECTOMY    ? CHOLECYSTECTOMY    ? COLONOSCOPY    ? GAS INSERTION Left 09/05/2021  ? Procedure: INSERTION OF GAS - C3F8;  Surgeon: [name], MD;  Location: [place];  Service: Ophthalmology;  Laterality: Left;  ? GAS/FLUID EXCHANGE Left 09/05/2021  ? Procedure: GAS/FLUID EXCHANGE;  Surgeon: [name], MD;  Location: [place];   Service: Ophthalmology;  Laterality: Left;  ? PARS PLANA VITRECTOMY Left 09/05/2021  ? Procedure: PARS PLANA VITRECTOMY WITH 25 GAUGE;  Surgeon: [name], MD;  Location: [place];  Service: Ophthalmology;  Laterality: Left;  ? PERFLUORONE INJECTION Left 09/05/2021  ? Procedure: PERFLUORON INJECTION;  Surgeon: [name], MD;  Location: [facility];  Service: Ophthalmology;  Laterality: Left;  ? PHOTOCOAGULATION WITH LASER Left 09/05/2021  ? Procedure: PHOTOCOAGULATION WITH LASER;  Surgeon: [name], MD;  Location: [place];  Service: Ophthalmology;  Laterality: Left;  ? SCLERAL BUCKLE Left 09/05/2021  ? Procedure: SCLERAL BUCKLE;  Surgeon: [name], MD;  Location: [place];  Service: Ophthalmology;  Laterality: Left;  ? UPPER GASTROINTESTINAL ENDOSCOPY    ? ?FAMILY HISTORY ?Family History  ?Problem Relation Age of Onset  ? Retinal detachment Sister   ? Diabetes Sister   ? Glaucoma Brother   ? Diabetes Brother   ? Colon cancer Brother   ?     dx at 60  ? Esophageal cancer Neg Hx   ? Rectal cancer Neg Hx   ? Stomach cancer Neg Hx   ? ?SOCIAL HISTORY ?Social History  ? ?Tobacco Use  ? Smoking status: Former  ? Smokeless tobacco: Never  ? Tobacco comments:  ?  quit smoking in 2002 or 2003  ?Vaping Use  ? Vaping Use: Never used  ?Substance Use Topics  ? Alcohol use: Yes  ?  Comment: very rarely  ? Drug use: No  ?  ? ?  ?OPHTHALMIC EXAM: ? ?Base Eye Exam   ? ? Visual Acuity (Snellen - Linear)   ? ?   Right Left  ? Dist cc 20/25 -2 CF at 3'  ? Dist ph cc NI NI  ? ? Correction: Glasses  ? ?  ?  ? ? Tonometry (Tonopen, 8:04 AM)   ? ?   Right Left  ? Pressure def 15  ? ?  ?  ? ? Pupils   ? ?   Dark Light Shape React APD  ? Right 6 6 Round NR None  ? Left 4 3 Round Minimal None  ? ?  ?  ? ? Visual Fields (Counting fingers)   ? ?   Left Right  ?   Full  ? Restrictions Total superior temporal, inferior temporal, superior nasal, inferior nasal deficiencies   ? ?  ?  ? ? Extraocular Movement   ? ?   Right Left  ?  Full, Ortho Full,  Ortho  ? ?  ?  ? ? Neuro/Psych   ? ? Oriented x3: Yes  ? Mood/Affect: Normal  ? ?  ?  ? ? Dilation   ? ? Left eye: 1.0% Mydriacyl, 2.5% Phenylephrine @ 8:04 AM  ? ?  ?  ? ?  ? ?  Slit Lamp and Fundus Exam   ? ? External Exam   ? ?   Right Left  ? External  Periorbital edema  ? ?  ?  ? ? Slit Lamp Exam   ? ?   Right Left  ? Lids/Lashes Dermatochalasis - upper lid Dermatochalasis - upper lid  ? Conjunctiva/Sclera white and quiet Subconjunctival hemorrhage - improving, sutures intact  ? Cornea 1+ inferior PEE Central Epithelial defect - closed, 2-3+ Punctate epithelial erosions  ? Anterior Chamber deep and clear moderate depth, narrow temporal angles  ? Iris round and dilated round and dilated, anterior bowing  ? Lens 2+ NS, 2+ cortical 2+ NS, 2+ cortical, 1+PC feathering, 1+ Posterior subcapsular cataract  ? Anterior Vitreous syneresis, asteroid hyalosis, PVD post vitrectomy, good gas fill  ? ?  ?  ? ? Fundus Exam   ? ?   Right Left  ? Disc  hazy view, perfused  ? C/D Ratio 0.5 0.5  ? Macula  flat under gas  ? Vessels  attenuated, tortuous  ? Periphery  Retina attached over buckle, good buckle height, good laser over buckle and around tears, PRE-OP: small tear with fibrosis @ 0200, bullous, temporal RD from 1230 to 0430  ? ?  ?  ? ?  ? ?Refraction   ? ? Wearing Rx   ? ?   Sphere Cylinder Axis Add  ? Right +1.00 +1.00 130 +2.75  ? Left +1.25 +0.75 017 +2.75  ? ?  ?  ? ?  ? ?IMAGING AND PROCEDURES  ?Imaging and Procedures for 09/12/2021 ? ? ?  ?  ? ?  ?ASSESSMENT/PLAN: ? ?  ICD-10-CM   ?1. Left retinal detachment  H33.22   ?  ?2. Retinal tear of right eye  H33.311   ?  ?3. Essential hypertension  I10   ?  ?4. Hypertensive retinopathy of both eyes  H35.033   ?  ?5. Combined forms of age-related cataract of both eyes  H25.813   ?  ? ?1. Rhegmatogenous retinal detachment with retinal hole, left eye ?- bullous, temporal, mac off detachment, onset of foveal involvement Tuesday, 03.14.23 by history ?- detached from 1230 to 430  oclock, fovea off, small tear at 0200 ?- s/p POW1 s/p SBP + PPV/PFO/EL/FAX/14% C3F8 OS, 03.23.2023 ?            - doing well  ?            - retina attached and in good position -- good buckle height and la

## 2021-09-12 ENCOUNTER — Encounter (INDEPENDENT_AMBULATORY_CARE_PROVIDER_SITE_OTHER): Payer: Self-pay | Admitting: Ophthalmology

## 2021-09-12 ENCOUNTER — Ambulatory Visit (INDEPENDENT_AMBULATORY_CARE_PROVIDER_SITE_OTHER): Payer: Medicare HMO | Admitting: Ophthalmology

## 2021-09-12 DIAGNOSIS — H3322 Serous retinal detachment, left eye: Secondary | ICD-10-CM

## 2021-09-12 DIAGNOSIS — H33311 Horseshoe tear of retina without detachment, right eye: Secondary | ICD-10-CM

## 2021-09-12 DIAGNOSIS — I10 Essential (primary) hypertension: Secondary | ICD-10-CM

## 2021-09-12 DIAGNOSIS — H25813 Combined forms of age-related cataract, bilateral: Secondary | ICD-10-CM

## 2021-09-12 DIAGNOSIS — H35033 Hypertensive retinopathy, bilateral: Secondary | ICD-10-CM

## 2021-09-12 MED ORDER — BACITRACIN-POLYMYXIN B 500-10000 UNIT/GM OP OINT: TOPICAL_OINTMENT | Freq: Four times a day (QID) | OPHTHALMIC | 3 refills | Status: DC

## 2021-09-12 MED ORDER — PREDNISOLONE ACETATE 1 % OP SUSP: 1.0000 [drp] | Freq: Four times a day (QID) | OPHTHALMIC | 0 refills | Status: DC

## 2021-09-12 MED ORDER — BACITRACIN-POLYMYXIN B 500-10000 UNIT/GM OP OINT: TOPICAL_OINTMENT | Freq: Four times a day (QID) | OPHTHALMIC | 3 refills | Status: AC

## 2021-09-16 ENCOUNTER — Encounter (INDEPENDENT_AMBULATORY_CARE_PROVIDER_SITE_OTHER): Payer: Self-pay | Admitting: Ophthalmology

## 2021-10-03 NOTE — Progress Notes (Signed)
?[facility] Note ? ?10/04/2021 ? ?  ? ?CHIEF COMPLAINT ?Patient presents for Post-op Follow-up ? ? ?HISTORY OF PRESENT ILLNESS: ?Alicia Miranda is a 66 y.o. female who presents to the clinic today for:  ? ?HPI   ? ? Post-op Follow-up   ?In left eye (S/p SBP+PPV/PFO/EL/FAX/C3F8 OS 3.23.23).  Discomfort includes pain (Occassional).  I, the attending physician,  performed the HPI with the patient and updated documentation appropriately. ? ?  ?  ? ? Comments   ?Patient states that she is still seeing the bubble. She is using PF OS QID, Cosopt OS QD, and oint QHS. She states that she has positioning as instructed 30 min at a time.  ? ?  ?  ?Last edited by [name], MD on 10/05/2021  1:21 AM.  ?  ?Patient states she is keeping head down as much as possible, she is not having any problems with the drops ? ?Referring physician: ?Antony Contras, MD ?[address] ?[address] ?[place],  [place] [address] ? ?HISTORICAL INFORMATION:  ? ?Selected notes from the [facility] ?Referred by Dr. Madilyn Hook for possible RD OS ?LEE: 03/16/02023 ?Ocular Hx-  ?PMH- HTN ?  ? ?CURRENT MEDICATIONS: ?Current Outpatient Medications (Ophthalmic Drugs)  ?Medication Sig  ? Bromfenac Sodium (PROLENSA) 0.07 % SOLN Place 1 drop into the left eye 4 (four) times daily.  ? LOTEPREDNOL ETABONATE OP Place 1 drop into the right eye 4 (four) times daily.  ? prednisoLONE acetate (PRED FORTE) 1 % ophthalmic suspension Place 1 drop into the left eye 4 (four) times daily.  ? ?No current facility-administered medications for this visit. (Ophthalmic Drugs)  ? ?Current Outpatient Medications (Other)  ?Medication Sig  ? APPLE CIDER VINEGAR PO Take 2 tablets by mouth daily. gummies  ? Cholecalciferol (VITAMIN D3) 10 MCG (400 UNIT) tablet Take 400 Units by mouth daily.  ? CINNAMON PO Take 1,000 mg by mouth every other day.  ? COLLAGEN PO Take 3 capsules by mouth daily.  ? Denosumab (PROLIA [place]) Inject 60 mg into the skin every 6  (six) months.  ? FEROSUL 325 (65 Fe) MG tablet Take 325 mg by mouth daily.  ? fexofenadine (ALLEGRA) 180 MG tablet Take 180 mg by mouth daily.  ? fluticasone (FLONASE) 50 MCG/ACT nasal spray Place 1 spray into both nostrils daily.  ? furosemide (LASIX) 40 MG tablet Take 40 mg by mouth 2 (two) times daily.  ? Glucosamine HCl (GLUCOSAMINE PO) Take 2 tablets by mouth daily.  ? HYDROcodone-acetaminophen (NORCO/VICODIN) 5-325 MG tablet Take 1 tablet by mouth every 4 (four) hours as needed for moderate pain.  ? losartan (COZAAR) 50 MG tablet Take 100 mg by mouth daily.  ? Magnesium 400 MG CAPS Take 400 mg by mouth daily.  ? Melatonin 5 MG CAPS Take 10 mg by mouth at bedtime.  ? pantoprazole (PROTONIX) 40 MG tablet Take 40 mg by mouth 2 (two) times daily.  ? rosuvastatin (CRESTOR) 20 MG tablet Take 20 mg by mouth daily.  ? TURMERIC PO Take 2 tablets by mouth daily.  ? ?No current facility-administered medications for this visit. (Other)  ? ?REVIEW OF SYSTEMS: ?ROS   ?Positive for: Musculoskeletal, Cardiovascular, Eyes, Respiratory ?Negative for: Constitutional, Gastrointestinal, Neurological, Skin, Genitourinary, HENT, Endocrine, Psychiatric, Allergic/Imm, Heme/Lymph ?Last edited by Annie Paras, COT on 10/04/2021  1:23 PM.  ?  ? ?ALLERGIES ?Allergies  ?Allergen Reactions  ? Codeine Itching  ? ?PAST MEDICAL HISTORY ?Past  Medical History:  ?Diagnosis Date  ? Allergy   ? Arthritis   ? legs  ? Asthma   ? GERD (gastroesophageal reflux disease)   ? Hyperlipidemia   ? Hypertension   ? ?Past Surgical History:  ?Procedure Laterality Date  ? ABDOMINAL HYSTERECTOMY    ? CHOLECYSTECTOMY    ? COLONOSCOPY    ? GAS INSERTION Left 09/05/2021  ? Procedure: INSERTION OF GAS - C3F8;  Surgeon: [name], MD;  Location: [facility];  Service: Ophthalmology;  Laterality: Left;  ? GAS/FLUID EXCHANGE Left 09/05/2021  ? Procedure: GAS/FLUID EXCHANGE;  Surgeon: [name], MD;  Location: [facility];  Service: Ophthalmology;  Laterality: Left;  ?  PARS PLANA VITRECTOMY Left 09/05/2021  ? Procedure: PARS PLANA VITRECTOMY WITH 25 GAUGE;  Surgeon: [name], MD;  Location: [facility];  Service: Ophthalmology;  Laterality: Left;  ? PERFLUORONE INJECTION Left 09/05/2021  ? Procedure: PERFLUORON INJECTION;  Surgeon: [name], MD;  Location: [facility];  Service: Ophthalmology;  Laterality: Left;  ? PHOTOCOAGULATION WITH LASER Left 09/05/2021  ? Procedure: PHOTOCOAGULATION WITH LASER;  Surgeon: [name], MD;  Location: [facility];  Service: Ophthalmology;  Laterality: Left;  ? SCLERAL BUCKLE Left 09/05/2021  ? Procedure: SCLERAL BUCKLE;  Surgeon: [name], MD;  Location: [facility];  Service: Ophthalmology;  Laterality: Left;  ? UPPER GASTROINTESTINAL ENDOSCOPY    ? ?FAMILY HISTORY ?Family History  ?Problem Relation Age of Onset  ? Retinal detachment Sister   ? Diabetes Sister   ? Glaucoma Brother   ? Diabetes Brother   ? Colon cancer Brother   ?     dx at 59  ? Esophageal cancer Neg Hx   ? Rectal cancer Neg Hx   ? Stomach cancer Neg Hx   ? ?SOCIAL HISTORY ?Social History  ? ?Tobacco Use  ? Smoking status: Former  ? Smokeless tobacco: Never  ? Tobacco comments:  ?  quit smoking in 2002 or 2003  ?Vaping Use  ? Vaping Use: Never used  ?Substance Use Topics  ? Alcohol use: Yes  ?  Comment: very rarely  ? Drug use: No  ?  ? ?  ?OPHTHALMIC EXAM: ? ?Base Eye Exam   ? ? Visual Acuity (Snellen - Linear)   ? ?   Right Left  ? Dist cc 20/20 CF at 3'  ? ? Correction: Glasses  ? ?  ?  ? ? Tonometry (Tonopen, 1:28 PM)   ? ?   Right Left  ? Pressure 20 21  ?squeezing ? ?  ?  ? ? Pupils   ? ?   Dark Light Shape React APD  ? Right 2 2 Round Minimal None  ? Left 2 2 Round Minimal None  ? ?  ?  ? ? Visual Fields   ? ?   Left Right  ?   Full  ? Restrictions Partial outer superior temporal, superior nasal, inferior nasal deficiencies   ? ?  ?  ? ? Extraocular Movement   ? ?   Right Left  ?  Full, Ortho Full, Ortho  ? ?  ?  ? ? Neuro/Psych   ? ? Oriented x3: Yes  ? Mood/Affect: Normal  ? ?   ?  ? ? Dilation   ? ? Both eyes: 2.5% Phenylephrine, 1.0% Mydriacyl @ 1:24 PM  ? ?  ?  ? ?  ? ?Slit Lamp and Fundus Exam   ? ? External Exam   ? ?  Right Left  ? External  Periorbital edema  ? ?  ?  ? ? Slit Lamp Exam   ? ?   Right Left  ? Lids/Lashes Dermatochalasis - upper lid Dermatochalasis - upper lid  ? Conjunctiva/Sclera white and quiet White and quiet, sutures dissolving  ? Cornea 1+ inferior PEE trace PEE  ? Anterior Chamber deep and clear moderate depth, narrow temporal angles  ? Iris round and dilated round and dilated, anterior bowing  ? Lens 2+ NS, 2+ cortical 2-3+ NS with brunescence, 2+ cortical, 2+ Posterior subcapsular cataract  ? Anterior Vitreous syneresis, asteroid hyalosis, PVD post vitrectomy, 45% gas fill  ? ?  ?  ? ? Fundus Exam   ? ?   Right Left  ? Disc pink and sharp Pink and Sharp  ? C/D Ratio 0.5 0.5  ? Macula flat, good foveal reflex, no heme or edema Blunted foveal reflex, RPE mottling  ? Vessels mild attenuation, mild tortuosity mild attenuation, mild tortuosity  ? Periphery operculated hole @ 1030 with mild surrounding pigment -- good laser surrounding, no SRF Retina attached over buckle, good buckle height, good laser over buckle and around tears, PRE-OP: small tear with fibrosis @ 0200, bullous, temporal RD from 1230 to 0430  ? ?  ?  ? ?  ? ?Refraction   ? ? Wearing Rx   ? ?   Sphere Cylinder Axis Add  ? Right +1.00 +1.00 130 +2.75  ? Left +1.25 +0.75 017 +2.75  ? ?  ?  ? ?  ? ?IMAGING AND PROCEDURES  ?Imaging and Procedures for 10/04/2021 ? ?OCT, Retina - OU - Both Eyes   ? ?   ?Right Eye ?Quality was good. Central Foveal Thickness: 271. Progression has been stable. Findings include normal foveal contour, no IRF, no SRF.  ? ?Left Eye ?Quality was good. Central Foveal Thickness: 437. Progression has improved. Findings include abnormal foveal contour, intraretinal fluid, no SRF, epiretinal membrane (Retina re-attached, ERM with blunted foveal contour and diffuse IRF / edema).   ? ?Notes ?*Images captured and stored on drive ? ?Diagnosis / Impression:  ?OD: NFP; no IRF/SRF ?OS: Retina re-attached, ERM with blunted foveal contour and diffuse IRF / edema--CME ? ?Clinical management

## 2021-10-04 ENCOUNTER — Ambulatory Visit (INDEPENDENT_AMBULATORY_CARE_PROVIDER_SITE_OTHER): Payer: Medicare HMO | Admitting: Ophthalmology

## 2021-10-04 ENCOUNTER — Encounter (INDEPENDENT_AMBULATORY_CARE_PROVIDER_SITE_OTHER): Payer: Self-pay | Admitting: Ophthalmology

## 2021-10-04 DIAGNOSIS — I10 Essential (primary) hypertension: Secondary | ICD-10-CM | POA: Diagnosis not present

## 2021-10-04 DIAGNOSIS — H3322 Serous retinal detachment, left eye: Secondary | ICD-10-CM | POA: Diagnosis not present

## 2021-10-04 DIAGNOSIS — H35033 Hypertensive retinopathy, bilateral: Secondary | ICD-10-CM | POA: Diagnosis not present

## 2021-10-04 DIAGNOSIS — H25813 Combined forms of age-related cataract, bilateral: Secondary | ICD-10-CM

## 2021-10-04 DIAGNOSIS — H33311 Horseshoe tear of retina without detachment, right eye: Secondary | ICD-10-CM

## 2021-10-04 MED ORDER — PROLENSA 0.07 % OP SOLN: 1.0000 [drp] | Freq: Four times a day (QID) | OPHTHALMIC | 6 refills | Status: DC

## 2021-10-05 ENCOUNTER — Encounter (INDEPENDENT_AMBULATORY_CARE_PROVIDER_SITE_OTHER): Payer: Self-pay | Admitting: Ophthalmology

## 2021-10-14 NOTE — Progress Notes (Signed)
?[facility] Note ? ?10/23/2021 ? ?  ? ?CHIEF COMPLAINT ?Patient presents for Post-op Follow-up ? ? ?HISTORY OF PRESENT ILLNESS: ?Alicia Miranda is a 66 y.o. female who presents to the clinic today for:  ? ?HPI   ? ? Post-op Follow-up   ?In left eye.  Discomfort includes none.  I, the attending physician,  performed the HPI with the patient and updated documentation appropriately. ? ?  ?  ? ? Comments   ?3 week follow up (7 week post op) RD repair OS-  Still seeing the gas bubble in the lower half of vision.  Using Prednisolone QID, Prolensa QID, Cosopt qd OS, and PSO ung qhs OS.  ? ?  ?  ?Last edited by [name], MD on 10/23/2021  8:58 PM.  ?  ?Pt states gas bubble gets smaller every day, she is using PF and Prolensa QID OS, cosopt qdaily and PSO at night ? ?Referring physician: ?Antony Contras, MD ?[address] ?[address] ?[place],  [place] [address] ? ?HISTORICAL INFORMATION:  ? ?Selected notes from the [place] ?Referred by Dr. Madilyn Hook for possible RD OS ?LEE: 03/16/02023 ?Ocular Hx-  ?PMH- HTN ?  ? ?CURRENT MEDICATIONS: ?Current Outpatient Medications (Ophthalmic Drugs)  ?Medication Sig  ? Bromfenac Sodium (PROLENSA) 0.07 % SOLN Place 1 drop into the left eye 4 (four) times daily.  ? LOTEPREDNOL ETABONATE OP Place 1 drop into the right eye 4 (four) times daily.  ? prednisoLONE acetate (PRED FORTE) 1 % ophthalmic suspension Place 1 drop into the left eye 4 (four) times daily.  ? ?No current facility-administered medications for this visit. (Ophthalmic Drugs)  ? ?Current Outpatient Medications (Other)  ?Medication Sig  ? APPLE CIDER VINEGAR PO Take 2 tablets by mouth daily. gummies  ? Cholecalciferol (VITAMIN D3) 10 MCG (400 UNIT) tablet Take 400 Units by mouth daily.  ? CINNAMON PO Take 1,000 mg by mouth every other day.  ? COLLAGEN PO Take 3 capsules by mouth daily.  ? Denosumab (PROLIA [place]) Inject 60 mg into the skin every 6 (six) months.  ? FEROSUL 325 (65 Fe) MG  tablet Take 325 mg by mouth daily.  ? fexofenadine (ALLEGRA) 180 MG tablet Take 180 mg by mouth daily.  ? fluticasone (FLONASE) 50 MCG/ACT nasal spray Place 1 spray into both nostrils daily.  ? furosemide (LASIX) 40 MG tablet Take 40 mg by mouth 2 (two) times daily.  ? Glucosamine HCl (GLUCOSAMINE PO) Take 2 tablets by mouth daily.  ? losartan (COZAAR) 50 MG tablet Take 100 mg by mouth daily.  ? Magnesium 400 MG CAPS Take 400 mg by mouth daily.  ? Melatonin 5 MG CAPS Take 10 mg by mouth at bedtime.  ? pantoprazole (PROTONIX) 40 MG tablet Take 40 mg by mouth 2 (two) times daily.  ? rosuvastatin (CRESTOR) 20 MG tablet Take 20 mg by mouth daily.  ? TURMERIC PO Take 2 tablets by mouth daily.  ? HYDROcodone-acetaminophen (NORCO/VICODIN) 5-325 MG tablet Take 1 tablet by mouth every 4 (four) hours as needed for moderate pain. (Patient not taking: Reported on 10/23/2021)  ? ?No current facility-administered medications for this visit. (Other)  ? ?REVIEW OF SYSTEMS: ?ROS   ?Positive for: Musculoskeletal, Cardiovascular, Eyes, Respiratory ?Negative for: Constitutional, Gastrointestinal, Neurological, Skin, Genitourinary, HENT, Endocrine, Psychiatric, Allergic/Imm, Heme/Lymph ?Last edited by Leonie Douglas, [place] on 10/23/2021  1:27 PM.  ?  ? ? ?ALLERGIES ?Allergies  ?Allergen Reactions  ? Codeine Itching  ? ?  PAST MEDICAL HISTORY ?Past Medical History:  ?Diagnosis Date  ? Allergy   ? Arthritis   ? legs  ? Asthma   ? GERD (gastroesophageal reflux disease)   ? Hyperlipidemia   ? Hypertension   ? ?Past Surgical History:  ?Procedure Laterality Date  ? ABDOMINAL HYSTERECTOMY    ? CHOLECYSTECTOMY    ? COLONOSCOPY    ? GAS INSERTION Left 09/05/2021  ? Procedure: INSERTION OF GAS - C3F8;  Surgeon: [name], MD;  Location: [facility];  Service: Ophthalmology;  Laterality: Left;  ? GAS/FLUID EXCHANGE Left 09/05/2021  ? Procedure: GAS/FLUID EXCHANGE;  Surgeon: [name], MD;  Location: [facility];  Service: Ophthalmology;  Laterality: Left;  ?  PARS PLANA VITRECTOMY Left 09/05/2021  ? Procedure: PARS PLANA VITRECTOMY WITH 25 GAUGE;  Surgeon: [name], MD;  Location: [facility];  Service: Ophthalmology;  Laterality: Left;  ? PERFLUORONE INJECTION Left 09/05/2021  ? Procedure: PERFLUORON INJECTION;  Surgeon: [name], MD;  Location: [facility];  Service: Ophthalmology;  Laterality: Left;  ? PHOTOCOAGULATION WITH LASER Left 09/05/2021  ? Procedure: PHOTOCOAGULATION WITH LASER;  Surgeon: [name], MD;  Location: [facility];  Service: Ophthalmology;  Laterality: Left;  ? SCLERAL BUCKLE Left 09/05/2021  ? Procedure: SCLERAL BUCKLE;  Surgeon: [name], MD;  Location: [facility];  Service: Ophthalmology;  Laterality: Left;  ? UPPER GASTROINTESTINAL ENDOSCOPY    ? ?FAMILY HISTORY ?Family History  ?Problem Relation Age of Onset  ? Retinal detachment Sister   ? Diabetes Sister   ? Glaucoma Brother   ? Diabetes Brother   ? Colon cancer Brother   ?     dx at 32  ? Esophageal cancer Neg Hx   ? Rectal cancer Neg Hx   ? Stomach cancer Neg Hx   ? ?SOCIAL HISTORY ?Social History  ? ?Tobacco Use  ? Smoking status: Former  ? Smokeless tobacco: Never  ? Tobacco comments:  ?  quit smoking in 2002 or 2003  ?Vaping Use  ? Vaping Use: Never used  ?Substance Use Topics  ? Alcohol use: Yes  ?  Comment: very rarely  ? Drug use: No  ?  ? ?  ?OPHTHALMIC EXAM: ? ?Base Eye Exam   ? ? Visual Acuity (Snellen - Linear)   ? ?   Right Left  ? Dist cc 20/20- CF 4'  ? Dist ph cc  20/250-  ? ?  ?  ? ? Tonometry (Tonopen, 1:42 PM)   ? ?   Right Left  ? Pressure 11 9  ? ?  ?  ? ? Pupils   ? ?   Dark Light Shape React APD  ? Right 2 1 Round Brisk None  ? Left 3 2 Round Brisk None  ? ?  ?  ? ? Visual Fields (Counting fingers)   ? ?   Left Right  ?  Full Full  ? ?  ?  ? ? Extraocular Movement   ? ?   Right Left  ?  Full Full  ? ?  ?  ? ? Neuro/Psych   ? ? Oriented x3: Yes  ? Mood/Affect: Normal  ? ?  ?  ? ? Dilation   ? ? Both eyes: 1.0% Mydriacyl, 2.5% Phenylephrine @ 1:42 PM  ? ?  ?  ? ?  ? ?Slit Lamp  and Fundus Exam   ? ? External Exam   ? ?   Right Left  ? External  Periorbital edema  ? ?  ?  ? ?  Slit Lamp Exam   ? ?   Right Left  ? Lids/Lashes Dermatochalasis - upper lid Dermatochalasis - upper lid  ? Conjunctiva/Sclera white and quiet White and quiet, sutures dissolving  ? Cornea trace PEE trace PEE  ? Anterior Chamber deep and clear moderate depth, narrow temporal angles, No cell or flare  ? Iris round and dilated round and dilated, anterior bowing  ? Lens 2+ NS, 2+ cortical 2-3+ NS with brunescence, 2-3+ cortical, 2+ Posterior subcapsular cataract  ? Anterior Vitreous syneresis, asteroid hyalosis, PVD post vitrectomy, ~5% gas fill  ? ?  ?  ? ? Fundus Exam   ? ?   Right Left  ? Disc pink and sharp Pink and Sharp  ? C/D Ratio 0.5 0.5  ? Macula flat, good foveal reflex, no heme or edema Flat, Blunted foveal reflex, RPE mottling, cystic changes nasally - slightly improved  ? Vessels mild attenuation, mild tortuosity attenuated, Tortuous  ? Periphery operculated hole @ 1030 with mild surrounding pigment -- good laser surrounding, no SRF, no new RT/RD Retina attached over buckle, good buckle height, good laser over buckle and around tears, PRE-OP: small tear with fibrosis @ 0200, bullous, temporal RD from 1230 to 0430  ? ?  ?  ? ?  ? ?IMAGING AND PROCEDURES  ?Imaging and Procedures for 10/23/2021 ? ?OCT, Retina - OU - Both Eyes   ? ?   ?Right Eye ?Quality was good. Central Foveal Thickness: 267. Progression has been stable. Findings include normal foveal contour, no IRF, no SRF.  ? ?Left Eye ?Quality was good. Central Foveal Thickness: 282. Progression has improved. Findings include abnormal foveal contour, intraretinal fluid, no SRF, epiretinal membrane, macular pucker (Retina re-attached, ERM with blunted foveal contour and interval improvement in IRF / CME, focal pockets of SRF superior to disc).  ? ?Notes ?*Images captured and stored on drive ? ?Diagnosis / Impression:  ?OD: NFP; no IRF/SRF ?OS: Retina  re-attached, ERM with blunted foveal contour and interval improvement in IRF / CME, focal pockets of SRF superior to disc ? ?Clinical management:  ?See below ? ?Abbreviations: NFP - Normal foveal profile. CME -

## 2021-10-23 ENCOUNTER — Encounter (INDEPENDENT_AMBULATORY_CARE_PROVIDER_SITE_OTHER): Payer: Self-pay | Admitting: Ophthalmology

## 2021-10-23 ENCOUNTER — Ambulatory Visit (INDEPENDENT_AMBULATORY_CARE_PROVIDER_SITE_OTHER): Payer: Medicare HMO | Admitting: Ophthalmology

## 2021-10-23 DIAGNOSIS — H35352 Cystoid macular degeneration, left eye: Secondary | ICD-10-CM | POA: Diagnosis not present

## 2021-10-23 DIAGNOSIS — I10 Essential (primary) hypertension: Secondary | ICD-10-CM

## 2021-10-23 DIAGNOSIS — H35033 Hypertensive retinopathy, bilateral: Secondary | ICD-10-CM | POA: Diagnosis not present

## 2021-10-23 DIAGNOSIS — H33311 Horseshoe tear of retina without detachment, right eye: Secondary | ICD-10-CM

## 2021-10-23 DIAGNOSIS — H25813 Combined forms of age-related cataract, bilateral: Secondary | ICD-10-CM

## 2021-10-23 DIAGNOSIS — H3322 Serous retinal detachment, left eye: Secondary | ICD-10-CM

## 2021-11-12 ENCOUNTER — Other Ambulatory Visit (INDEPENDENT_AMBULATORY_CARE_PROVIDER_SITE_OTHER): Payer: Self-pay | Admitting: Ophthalmology

## 2021-11-13 ENCOUNTER — Encounter (INDEPENDENT_AMBULATORY_CARE_PROVIDER_SITE_OTHER): Payer: Self-pay

## 2021-11-14 ENCOUNTER — Other Ambulatory Visit (INDEPENDENT_AMBULATORY_CARE_PROVIDER_SITE_OTHER): Payer: Self-pay

## 2021-11-14 MED ORDER — PREDNISOLONE ACETATE 1 % OP SUSP: 1.0000 [drp] | Freq: Four times a day (QID) | OPHTHALMIC | 0 refills | Status: DC

## 2021-11-25 ENCOUNTER — Other Ambulatory Visit (INDEPENDENT_AMBULATORY_CARE_PROVIDER_SITE_OTHER): Payer: Self-pay

## 2021-11-25 MED ORDER — PROLENSA 0.07 % OP SOLN: 1.0000 [drp] | Freq: Four times a day (QID) | OPHTHALMIC | 6 refills | Status: DC

## 2021-11-25 NOTE — Progress Notes (Signed)
[facility] Note  11/27/2021     CHIEF COMPLAINT Patient presents for Post-op Follow-up   HISTORY OF PRESENT ILLNESS: Alicia Miranda is a 66 y.o. female who presents to the clinic today for:   HPI     Post-op Follow-up   In left eye (S.p SBP/PPV/PFO/EL/FAX C3F8 OS (03.23.23)).  Discomfort includes none.  Vision is improved, is blurred at distance and is blurred at near.  I, the attending physician,  performed the HPI with the patient and updated documentation appropriately.        Comments   Patient states that the vision has improved but still just blurry. She is using PF and Prolensa OS QID.       Last edited by [name], MD on 11/30/2021 12:24 AM.    Patient states bubble has completely gone.  Vision is blurry. At times when she orders Prolensa it takes a day or two for the pharmacy to get the drop in.   Referring physician: Antony Contras, MD [address] [place],  [place] [address]  HISTORICAL INFORMATION:   Selected notes from the MEDICAL RECORD NUMBER Referred by Dr. Madilyn Hook for possible RD OS LEE: 03/16/02023 Ocular Hx-  PMH- HTN    CURRENT MEDICATIONS: Current Outpatient Medications (Ophthalmic Drugs)  Medication Sig   Bromfenac Sodium (PROLENSA) 0.07 % SOLN Place 1 drop into the left eye 4 (four) times daily.   LOTEPREDNOL ETABONATE OP Place 1 drop into the right eye 4 (four) times daily.   prednisoLONE acetate (PRED FORTE) 1 % ophthalmic suspension Place 1 drop into the left eye 4 (four) times daily.   No current facility-administered medications for this visit. (Ophthalmic Drugs)   Current Outpatient Medications (Other)  Medication Sig   APPLE CIDER VINEGAR PO Take 2 tablets by mouth daily. gummies   Cholecalciferol (VITAMIN D3) 10 MCG (400 UNIT) tablet Take 400 Units by mouth daily.   CINNAMON PO Take 1,000 mg by mouth every other day.   COLLAGEN PO Take 3 capsules by mouth daily.   Denosumab (PROLIA  [place]) Inject 60 mg into the skin every 6 (six) months.   FEROSUL 325 (65 Fe) MG tablet Take 325 mg by mouth daily.   fexofenadine (ALLEGRA) 180 MG tablet Take 180 mg by mouth daily.   fluticasone (FLONASE) 50 MCG/ACT nasal spray Place 1 spray into both nostrils daily.   furosemide (LASIX) 40 MG tablet Take 40 mg by mouth 2 (two) times daily.   Glucosamine HCl (GLUCOSAMINE PO) Take 2 tablets by mouth daily.   losartan (COZAAR) 50 MG tablet Take 100 mg by mouth daily.   Magnesium 400 MG CAPS Take 400 mg by mouth daily.   Melatonin 5 MG CAPS Take 10 mg by mouth at bedtime.   pantoprazole (PROTONIX) 40 MG tablet Take 40 mg by mouth 2 (two) times daily.   rosuvastatin (CRESTOR) 20 MG tablet Take 20 mg by mouth daily.   TURMERIC PO Take 2 tablets by mouth daily.   HYDROcodone-acetaminophen (NORCO/VICODIN) 5-325 MG tablet Take 1 tablet by mouth every 4 (four) hours as needed for moderate pain. (Patient not taking: Reported on 10/23/2021)   No current facility-administered medications for this visit. (Other)   REVIEW OF SYSTEMS: ROS   Positive for: Musculoskeletal, Cardiovascular, Eyes, Respiratory Negative for: Constitutional, Gastrointestinal, Neurological, Skin, Genitourinary, HENT, Endocrine, Psychiatric, Allergic/Imm, Heme/Lymph Last edited by Annie Paras, COT on 11/27/2021  1:14 PM.     ALLERGIES  Allergies  Allergen Reactions   Codeine Itching   PAST MEDICAL HISTORY Past Medical History:  Diagnosis Date   Allergy    Arthritis    legs   Asthma    GERD (gastroesophageal reflux disease)    Hyperlipidemia    Hypertension    Past Surgical History:  Procedure Laterality Date   ABDOMINAL HYSTERECTOMY     CHOLECYSTECTOMY     COLONOSCOPY     GAS INSERTION Left 09/05/2021   Procedure: INSERTION OF GAS - C3F8;  Surgeon: [name], MD;  Location: [facility];  Service: Ophthalmology;  Laterality: Left;   GAS/FLUID EXCHANGE Left 09/05/2021   Procedure: GAS/FLUID EXCHANGE;  Surgeon:  [name], MD;  Location: [facility];  Service: Ophthalmology;  Laterality: Left;   PARS PLANA VITRECTOMY Left 09/05/2021   Procedure: PARS PLANA VITRECTOMY WITH 25 GAUGE;  Surgeon: [name], MD;  Location: [facility];  Service: Ophthalmology;  Laterality: Left;   PERFLUORONE INJECTION Left 09/05/2021   Procedure: PERFLUORON INJECTION;  Surgeon: [name], MD;  Location: [facility];  Service: Ophthalmology;  Laterality: Left;   PHOTOCOAGULATION WITH LASER Left 09/05/2021   Procedure: PHOTOCOAGULATION WITH LASER;  Surgeon: [name], MD;  Location: [facility];  Service: Ophthalmology;  Laterality: Left;   SCLERAL BUCKLE Left 09/05/2021   Procedure: SCLERAL BUCKLE;  Surgeon: [name], MD;  Location: [facility];  Service: Ophthalmology;  Laterality: Left;   UPPER GASTROINTESTINAL ENDOSCOPY     FAMILY HISTORY Family History  Problem Relation Age of Onset   Retinal detachment Sister    Diabetes Sister    Glaucoma Brother    Diabetes Brother    Colon cancer Brother        dx at 32   Esophageal cancer Neg Hx    Rectal cancer Neg Hx    Stomach cancer Neg Hx    SOCIAL HISTORY Social History   Tobacco Use   Smoking status: Former   Smokeless tobacco: Never   Tobacco comments:    quit smoking in 2002 or 2003  Vaping Use   Vaping Use: Never used  Substance Use Topics   Alcohol use: Yes    Comment: very rarely   Drug use: No       OPHTHALMIC EXAM:  Base Eye Exam     Visual Acuity (Snellen - Linear)       Right Left   Dist cc 20/20 CF at 3'   Dist ph cc  20/350    Correction: Glasses         Tonometry (Tonopen, 1:21 PM)       Right Left   Pressure 11 11         Pupils       Dark Light Shape React APD   Right 2 1 Round Brisk None   Left 3 2 Round Brisk None         Visual Fields       Left Right     Full   Restrictions Partial outer superior temporal, inferior temporal, superior nasal, inferior nasal deficiencies          Extraocular Movement       Right  Left    Full, Ortho Full, Ortho         Neuro/Psych     Oriented x3: Yes   Mood/Affect: Normal         Dilation     Both eyes: 1.0% Mydriacyl, 2.5% Phenylephrine @ 1:16 PM  Slit Lamp and Fundus Exam     External Exam       Right Left   External  Periorbital edema -- improved         Slit Lamp Exam       Right Left   Lids/Lashes Dermatochalasis - upper lid, mild Meibomian gland dysfunction Dermatochalasis - upper lid   Conjunctiva/Sclera white and quiet White and quiet, sutures dissolved   Cornea trace PEE trace PEE   Anterior Chamber deep and clear moderate depth, narrow temporal angles, No cell or flare   Iris round and dilated round and dilated, anterior bowing   Lens 2+ NS, 2+ cortical 2-3+ NS with brunescence, 2-3+ cortical, 2+ Posterior subcapsular cataract   Anterior Vitreous syneresis, asteroid hyalosis, PVD post vitrectomy, gas bubble gone         Fundus Exam       Right Left   Disc pink and sharp Pink and Sharp   C/D Ratio 0.5 0.5   Macula flat, good foveal reflex, no heme or edema Flat, Blunted foveal reflex, RPE mottling, cystic changes nasally - persistent   Vessels mild attenuation, mild tortuosity attenuated, Tortuous   Periphery operculated hole @ 1030 with mild surrounding pigment -- good laser surrounding, no SRF, no new RT/RD Retina attached over buckle, good buckle height, good laser over buckle and around tears, PRE-OP: small tear with fibrosis @ 0200, bullous, temporal RD from 1230 to 0430           Refraction     Wearing Rx       Sphere Cylinder Axis Add   Right +1.00 +1.00 130 +2.75   Left +1.25 +0.75 017 +2.75         Manifest Refraction   Unable to refract any better          IMAGING AND PROCEDURES  Imaging and Procedures for 11/27/2021  OCT, Retina - OU - Both Eyes       Right Eye Quality was good. Central Foveal Thickness: 270. Progression has been stable. Findings include normal foveal contour,  no IRF, no SRF.   Left Eye Quality was good. Central Foveal Thickness: 318. Progression has improved. Findings include no SRF, abnormal foveal contour, epiretinal membrane, intraretinal fluid, macular pucker (Retina re-attached, ERM with blunted foveal contour and persistent IRF / CME -- slightly increased, focal pockets of SRF superior to disc).   Notes *Images captured and stored on drive  Diagnosis / Impression:  OD: NFP; no IRF/SRF OS: Retina re-attached, ERM with blunted foveal contour and persistent IRF / CME -- slightly improved, focal pockets of SRF superior to disc  Clinical management:  See below  Abbreviations: NFP - Normal foveal profile. CME - cystoid macular edema. PED - pigment epithelial detachment. IRF - intraretinal fluid. SRF - subretinal fluid. EZ - ellipsoid zone. ERM - epiretinal membrane. ORA - outer retinal atrophy. ORT - outer retinal tubulation. SRHM - subretinal hyper-reflective material. IRHM - intraretinal hyper-reflective material            ASSESSMENT/PLAN:    ICD-10-CM   1. Left retinal detachment  H33.22     2. Cystoid macular edema of left eye  H35.352 OCT, Retina - OU - Both Eyes    3. Retinal tear of right eye  H33.311     4. Essential hypertension  I10     5. Hypertensive retinopathy of both eyes  H35.033     6. Combined forms of age-related cataract of both eyes  H25.813      1,2. Rhegmatogenous retinal detachment with retinal hole, left eye - bullous, temporal, mac off detachment, onset of foveal involvement Tuesday, 03.14.23 by history - detached from 1230 to 430 oclock, fovea off, small tear at 0200 - s/p SBP + PPV/PFO/EL/FAX/14% C3F8 OS, 03.23.2023             - doing well              - retina attached and in good position -- good buckle height and laser around breaks  - OCT shows Retina re-attached, ERM with blunted foveal contour and persistent IRF / CME -- slightly improved, focal pockets of SRF superior to disc             -  IOP good at 9  - gas bubble gone             - continue PF and Prolensa QID OS for CME             - d/c face down positioning, avoid laying flat on back   - f/u 4 weeks - POV OU, DFE OU per MD  3. Retinal hole OD  - operculated retinal hole located at 1030 with mild surrounding pigment, no SRF - s/p laser retinopexy OD (03.16.23) -- good laser surrounding - monitor   4,5. Hypertensive retinopathy OU - discussed importance of tight BP control - monitor   6. Mixed age-related cataracts OU  - The symptoms of cataract, surgical options, and treatments and risks were discussed with patient. - discussed diagnosis and progression, specifically post-vitrectomy progression OS - will refer to Dr. Lucianne Lei for cataract evaluation   Ophthalmic Meds Ordered this visit:  No orders of the defined types were placed in this encounter.    Return in about 4 weeks (around 12/25/2021) for DFE, OCT.  There are no Patient Instructions on file for this visit.   Explained the diagnoses, plan, and follow up with the patient and they expressed understanding.  Patient expressed understanding of the importance of proper follow up care.   This document serves as a record of services personally performed by Gardiner Sleeper, MD, PhD. It was created on their behalf by Orvan Falconer, an ophthalmic technician. The creation of this record is the provider's dictation and/or activities during the visit.    Electronically signed by: Orvan Falconer, OA, 11/30/21  12:27 AM  This document serves as a record of services personally performed by Gardiner Sleeper, MD, PhD. It was created on their behalf by Leonie Douglas, an ophthalmic technician. The creation of this record is the provider's dictation and/or activities during the visit.    Electronically signed by: Leonie Douglas COA, 11/30/21  12:27 AM  Gardiner Sleeper, M.D., Ph.D. Diseases & Surgery of the Retina and Vitreous [facility]  I have  reviewed the above documentation for accuracy and completeness, and I agree with the above. Gardiner Sleeper, M.D., Ph.D. 11/30/21 12:27 AM   Abbreviations: M myopia (nearsighted); A astigmatism; H hyperopia (farsighted); P presbyopia; Mrx spectacle prescription;  CTL contact lenses; OD right eye; OS left eye; OU both eyes  XT exotropia; ET esotropia; PEK punctate epithelial keratitis; PEE punctate epithelial erosions; DES dry eye syndrome; MGD meibomian gland dysfunction; ATs artificial tears; PFAT's preservative free artificial tears; [place] nuclear sclerotic cataract; PSC posterior subcapsular cataract; ERM epi-retinal membrane; PVD posterior vitreous detachment; RD retinal detachment; DM diabetes mellitus; DR diabetic retinopathy; NPDR non-proliferative diabetic  retinopathy; PDR proliferative diabetic retinopathy; CSME clinically significant macular edema; DME diabetic macular edema; dbh dot blot hemorrhages; CWS cotton wool spot; POAG primary open angle glaucoma; C/D cup-to-disc ratio; HVF humphrey visual field; GVF goldmann visual field; OCT optical coherence tomography; IOP intraocular pressure; BRVO Branch retinal vein occlusion; CRVO central retinal vein occlusion; CRAO central retinal artery occlusion; BRAO branch retinal artery occlusion; RT retinal tear; SB scleral buckle; PPV pars plana vitrectomy; VH Vitreous hemorrhage; PRP panretinal laser photocoagulation; IVK intravitreal kenalog; VMT vitreomacular traction; MH Macular hole;  NVD neovascularization of the disc; NVE neovascularization elsewhere; AREDS age related eye disease study; ARMD age related macular degeneration; POAG primary open angle glaucoma; EBMD epithelial/anterior basement membrane dystrophy; ACIOL anterior chamber intraocular lens; IOL intraocular lens; PCIOL posterior chamber intraocular lens; Phaco/IOL phacoemulsification with intraocular lens placement; [place] photorefractive keratectomy; LASIK laser assisted in situ keratomileusis;  HTN hypertension; DM diabetes mellitus; COPD chronic obstructive pulmonary disease

## 2021-11-27 ENCOUNTER — Encounter (INDEPENDENT_AMBULATORY_CARE_PROVIDER_SITE_OTHER): Payer: Self-pay | Admitting: Ophthalmology

## 2021-11-27 ENCOUNTER — Ambulatory Visit (INDEPENDENT_AMBULATORY_CARE_PROVIDER_SITE_OTHER): Payer: Medicare HMO | Admitting: Ophthalmology

## 2021-11-27 DIAGNOSIS — M25561 Pain in right knee: Secondary | ICD-10-CM | POA: Diagnosis not present

## 2021-11-27 DIAGNOSIS — H33311 Horseshoe tear of retina without detachment, right eye: Secondary | ICD-10-CM

## 2021-11-27 DIAGNOSIS — I10 Essential (primary) hypertension: Secondary | ICD-10-CM | POA: Diagnosis not present

## 2021-11-27 DIAGNOSIS — M25562 Pain in left knee: Secondary | ICD-10-CM | POA: Diagnosis not present

## 2021-11-27 DIAGNOSIS — H35352 Cystoid macular degeneration, left eye: Secondary | ICD-10-CM | POA: Diagnosis not present

## 2021-11-27 DIAGNOSIS — H25813 Combined forms of age-related cataract, bilateral: Secondary | ICD-10-CM

## 2021-11-27 DIAGNOSIS — H35033 Hypertensive retinopathy, bilateral: Secondary | ICD-10-CM | POA: Diagnosis not present

## 2021-11-27 DIAGNOSIS — H3322 Serous retinal detachment, left eye: Secondary | ICD-10-CM | POA: Diagnosis not present

## 2021-11-27 DIAGNOSIS — M170 Bilateral primary osteoarthritis of knee: Secondary | ICD-10-CM | POA: Diagnosis not present

## 2021-11-30 ENCOUNTER — Encounter (INDEPENDENT_AMBULATORY_CARE_PROVIDER_SITE_OTHER): Payer: Self-pay | Admitting: Ophthalmology

## 2021-12-06 DIAGNOSIS — H35352 Cystoid macular degeneration, left eye: Secondary | ICD-10-CM | POA: Diagnosis not present

## 2021-12-06 DIAGNOSIS — H35033 Hypertensive retinopathy, bilateral: Secondary | ICD-10-CM | POA: Diagnosis not present

## 2021-12-06 DIAGNOSIS — H25813 Combined forms of age-related cataract, bilateral: Secondary | ICD-10-CM | POA: Diagnosis not present

## 2021-12-06 DIAGNOSIS — H3322 Serous retinal detachment, left eye: Secondary | ICD-10-CM | POA: Diagnosis not present

## 2021-12-06 DIAGNOSIS — H33311 Horseshoe tear of retina without detachment, right eye: Secondary | ICD-10-CM | POA: Diagnosis not present

## 2021-12-06 DIAGNOSIS — H40053 Ocular hypertension, bilateral: Secondary | ICD-10-CM | POA: Diagnosis not present

## 2021-12-11 DIAGNOSIS — M1712 Unilateral primary osteoarthritis, left knee: Secondary | ICD-10-CM | POA: Diagnosis not present

## 2021-12-11 DIAGNOSIS — M25562 Pain in left knee: Secondary | ICD-10-CM | POA: Diagnosis not present

## 2021-12-12 DIAGNOSIS — M1711 Unilateral primary osteoarthritis, right knee: Secondary | ICD-10-CM | POA: Diagnosis not present

## 2021-12-12 DIAGNOSIS — M25561 Pain in right knee: Secondary | ICD-10-CM | POA: Diagnosis not present

## 2021-12-19 DIAGNOSIS — M25562 Pain in left knee: Secondary | ICD-10-CM | POA: Diagnosis not present

## 2021-12-19 DIAGNOSIS — M1712 Unilateral primary osteoarthritis, left knee: Secondary | ICD-10-CM | POA: Diagnosis not present

## 2021-12-19 NOTE — Progress Notes (Signed)
[facility] Note  12/25/2021     CHIEF COMPLAINT Patient presents for Retina Follow Up   HISTORY OF PRESENT ILLNESS: Alicia Miranda is a 66 y.o. female who presents to the clinic today for:   HPI     Retina Follow Up   Patient presents with  Retinal Break/Detachment.  In left eye.  This started 4 weeks ago.  I, the attending physician,  performed the HPI with the patient and updated documentation appropriately.        Comments   Patient here for 4 weeks retina follow up for RD OS. Patient states vision doing ok. Its still blurry OS.  Sees eye doctor for future cataract surgery. On Tears QID OU, Prolensa QID OS, Prednisone QID OS and Dorzolamide TID OS.       Last edited by [name], MD on 12/28/2021  1:38 AM.    Pt states she had an appt with Dr. Lucianne Lei, she states her IOP was high that day, so Dr. Lucianne Lei gave her a drop to use, she states Lucianne Lei said her left eye is ready for cat sx, she goes back on August 4th for measurements  Referring physician: Lisabeth Pick, MD [place],  [place] [address]  HISTORICAL INFORMATION:   Selected notes from the MEDICAL RECORD NUMBER Referred by Dr. Madilyn Hook for possible RD OS LEE: 03/16/02023 Ocular Hx-  PMH- HTN    CURRENT MEDICATIONS: Current Outpatient Medications (Ophthalmic Drugs)  Medication Sig   Bromfenac Sodium (PROLENSA) 0.07 % SOLN Place 1 drop into the left eye 4 (four) times daily.   LOTEPREDNOL ETABONATE OP Place 1 drop into the right eye 4 (four) times daily.   prednisoLONE acetate (PRED FORTE) 1 % ophthalmic suspension Place 1 drop into the left eye 4 (four) times daily.   No current facility-administered medications for this visit. (Ophthalmic Drugs)   Current Outpatient Medications (Other)  Medication Sig   APPLE CIDER VINEGAR PO Take 2 tablets by mouth daily. gummies   Cholecalciferol (VITAMIN D3) 10 MCG (400 UNIT) tablet Take 400 Units by mouth daily.   CINNAMON PO  Take 1,000 mg by mouth every other day.   COLLAGEN PO Take 3 capsules by mouth daily.   Denosumab (PROLIA Babb) Inject 60 mg into the skin every 6 (six) months.   FEROSUL 325 (65 Fe) MG tablet Take 325 mg by mouth daily.   fexofenadine (ALLEGRA) 180 MG tablet Take 180 mg by mouth daily.   fluticasone (FLONASE) 50 MCG/ACT nasal spray Place 1 spray into both nostrils daily.   furosemide (LASIX) 40 MG tablet Take 40 mg by mouth 2 (two) times daily.   Glucosamine HCl (GLUCOSAMINE PO) Take 2 tablets by mouth daily.   losartan (COZAAR) 50 MG tablet Take 100 mg by mouth daily.   Magnesium 400 MG CAPS Take 400 mg by mouth daily.   Melatonin 5 MG CAPS Take 10 mg by mouth at bedtime.   pantoprazole (PROTONIX) 40 MG tablet Take 40 mg by mouth 2 (two) times daily.   rosuvastatin (CRESTOR) 20 MG tablet Take 20 mg by mouth daily.   TURMERIC PO Take 2 tablets by mouth daily.   HYDROcodone-acetaminophen (NORCO/VICODIN) 5-325 MG tablet Take 1 tablet by mouth every 4 (four) hours as needed for moderate pain. (Patient not taking: Reported on 10/23/2021)   No current facility-administered medications for this visit. (Other)   REVIEW OF SYSTEMS: ROS   Positive for: Musculoskeletal, Cardiovascular, Eyes,  Respiratory Negative for: Constitutional, Gastrointestinal, Neurological, Skin, Genitourinary, HENT, Endocrine, Psychiatric, Allergic/Imm, Heme/Lymph Last edited by Theodore Demark, COA on 12/25/2021  1:14 PM.     ALLERGIES Allergies  Allergen Reactions   Codeine Itching   PAST MEDICAL HISTORY Past Medical History:  Diagnosis Date   Allergy    Arthritis    legs   Asthma    GERD (gastroesophageal reflux disease)    Hyperlipidemia    Hypertension    Past Surgical History:  Procedure Laterality Date   ABDOMINAL HYSTERECTOMY     CHOLECYSTECTOMY     COLONOSCOPY     GAS INSERTION Left 09/05/2021   Procedure: INSERTION OF GAS - C3F8;  Surgeon: [name], MD;  Location: [facility];  Service:  Ophthalmology;  Laterality: Left;   GAS/FLUID EXCHANGE Left 09/05/2021   Procedure: GAS/FLUID EXCHANGE;  Surgeon: [name], MD;  Location: [facility];  Service: Ophthalmology;  Laterality: Left;   PARS PLANA VITRECTOMY Left 09/05/2021   Procedure: PARS PLANA VITRECTOMY WITH 25 GAUGE;  Surgeon: [name], MD;  Location: [facility];  Service: Ophthalmology;  Laterality: Left;   PERFLUORONE INJECTION Left 09/05/2021   Procedure: PERFLUORON INJECTION;  Surgeon: [name], MD;  Location: [facility];  Service: Ophthalmology;  Laterality: Left;   PHOTOCOAGULATION WITH LASER Left 09/05/2021   Procedure: PHOTOCOAGULATION WITH LASER;  Surgeon: [name], MD;  Location: [facility];  Service: Ophthalmology;  Laterality: Left;   SCLERAL BUCKLE Left 09/05/2021   Procedure: SCLERAL BUCKLE;  Surgeon: [name], MD;  Location: [facility];  Service: Ophthalmology;  Laterality: Left;   UPPER GASTROINTESTINAL ENDOSCOPY     FAMILY HISTORY Family History  Problem Relation Age of Onset   Retinal detachment Sister    Diabetes Sister    Glaucoma Brother    Diabetes Brother    Colon cancer Brother        dx at 35   Esophageal cancer Neg Hx    Rectal cancer Neg Hx    Stomach cancer Neg Hx    SOCIAL HISTORY Social History   Tobacco Use   Smoking status: Former   Smokeless tobacco: Never   Tobacco comments:    quit smoking in 2002 or 2003  Vaping Use   Vaping Use: Never used  Substance Use Topics   Alcohol use: Yes    Comment: very rarely   Drug use: No       OPHTHALMIC EXAM:  Base Eye Exam     Visual Acuity (Snellen - Linear)       Right Left   Dist [place] 20/20 CF at 3'   Dist ph [place]  20/250         Tonometry (Tonopen, 1:10 PM)       Right Left   Pressure 12 13         Pupils       Dark Light Shape React APD   Right 2 1 Round Brisk None   Left 3 2 Round Brisk None         Visual Fields (Counting fingers)       Left Right     Full   Restrictions Partial outer superior temporal,  inferior temporal, superior nasal, inferior nasal deficiencies          Extraocular Movement       Right Left    Full, Ortho Full, Ortho         Neuro/Psych     Oriented x3: Yes   Mood/Affect: Normal  Dilation     Both eyes: 1.0% Mydriacyl, 2.5% Phenylephrine @ 1:10 PM           Slit Lamp and Fundus Exam     External Exam       Right Left   External  Periorbital edema -- improved         Slit Lamp Exam       Right Left   Lids/Lashes Dermatochalasis - upper lid, mild Meibomian gland dysfunction Dermatochalasis - upper lid   Conjunctiva/Sclera white and quiet White and quiet, sutures dissolved   Cornea trace PEE 1+ fine Punctate epithelial erosions   Anterior Chamber deep and clear moderate depth, narrow temporal angles, No cell or flare   Iris round and dilated round and dilated, anterior bowing   Lens 2+ NS, 2+ cortical 2-3+ NS with brunescence, 2-3+ cortical, 1-2+ Posterior subcapsular cataract   Anterior Vitreous syneresis, asteroid hyalosis, PVD post vitrectomy, gas bubble gone         Fundus Exam       Right Left   Disc pink and sharp Pink and Sharp   C/D Ratio 0.5 0.5   Macula flat, good foveal reflex, no heme or edema Flat, Blunted foveal reflex, RPE mottling, cystic changes centrally and nasally - improved   Vessels mild attenuation, mild tortuosity attenuated, Tortuous, AV crossing changes   Periphery operculated hole @ 1030 with mild surrounding pigment -- good laser surrounding, no SRF, no new RT/RD Retina attached over buckle, good buckle height, good laser over buckle and around tears, PRE-OP: small tear with fibrosis @ 0200, bullous, temporal RD from 1230 to 0430           Refraction     Wearing Rx       Sphere Cylinder Axis Add   Right +1.00 +1.00 130 +2.75   Left +1.25 +0.75 017 +2.75           IMAGING AND PROCEDURES  Imaging and Procedures for 12/25/2021  OCT, Retina - OU - Both Eyes       Right Eye Quality  was good. Central Foveal Thickness: 275. Progression has been stable. Findings include normal foveal contour, no IRF, no SRF.   Left Eye Quality was good. Central Foveal Thickness: 291. Progression has improved. Findings include no SRF, abnormal foveal contour, epiretinal membrane, intraretinal fluid, macular pucker (Retina re-attached, ERM with blunted foveal contour and mild pucker, interval improvement in IRF / CME, focal pockets of SRF superior to disc -- slightly improved).   Notes *Images captured and stored on drive  Diagnosis / Impression:  OD: NFP; no IRF/SRF OS: Retina re-attached, ERM with blunted foveal contour and mild pucker, interval improvement in IRF / CME, focal pockets of SRF superior to disc -- slightly improved  Clinical management:  See below  Abbreviations: NFP - Normal foveal profile. CME - cystoid macular edema. PED - pigment epithelial detachment. IRF - intraretinal fluid. SRF - subretinal fluid. EZ - ellipsoid zone. ERM - epiretinal membrane. ORA - outer retinal atrophy. ORT - outer retinal tubulation. SRHM - subretinal hyper-reflective material. IRHM - intraretinal hyper-reflective material            ASSESSMENT/PLAN:    ICD-10-CM   1. Left retinal detachment  H33.22     2. Cystoid macular edema of left eye  H35.352 OCT, Retina - OU - Both Eyes    3. Retinal tear of right eye  H33.311     4. Essential hypertension  I10  5. Hypertensive retinopathy of both eyes  H35.033     6. Combined forms of age-related cataract of both eyes  H25.813      1,2. Rhegmatogenous retinal detachment with retinal hole, left eye - bullous, temporal, mac off detachment, onset of foveal involvement Tuesday, 03.14.23 by history - detached from 1230 to 430 oclock, fovea off, small tear at 0200 - s/p SBP + PPV/PFO/EL/FAX/14% C3F8 OS, 03.23.2023             - doing well              - retina attached and in good position -- good buckle height and laser around breaks  -  BCVA OS 20/250 -- limited by post-vitrectomy cataract  - OCT shows retina re-attached, ERM with blunted foveal contour and mild pucker, interval improvement in IRF / CME, focal pockets of SRF superior to disc -- slightly improved             - IOP good at 13  - gas bubble gone             - continue PF and Prolensa QID OS for CME             - d/c face down positioning, avoid laying flat on back   - f/u 4 weeks - POV OU, DFE OU per MD  3. Retinal hole OD  - operculated retinal hole located at 1030 with mild surrounding pigment, no SRF - s/p laser retinopexy OD (03.16.23) -- good laser surrounding - monitor   4,5. Hypertensive retinopathy OU - discussed importance of tight BP control - monitor   6. Mixed age-related cataracts OU  - The symptoms of cataract, surgical options, and treatments and risks were discussed with patient. - discussed diagnosis and progression, specifically post-vitrectomy progression OS - now under the expert management of Dr. Lucianne Lei  Ophthalmic Meds Ordered this visit:  No orders of the defined types were placed in this encounter.    Return in about 4 weeks (around 01/22/2022) for f/u RD OS, DFE, OCT.  There are no Patient Instructions on file for this visit.   Explained the diagnoses, plan, and follow up with the patient and they expressed understanding.  Patient expressed understanding of the importance of proper follow up care.   This document serves as a record of services personally performed by Gardiner Sleeper, MD, PhD. It was created on their behalf by Orvan Falconer, an ophthalmic technician. The creation of this record is the provider's dictation and/or activities during the visit.    Electronically signed by: Orvan Falconer, OA, 12/28/21  1:41 AM  This document serves as a record of services personally performed by Gardiner Sleeper, MD, PhD. It was created on their behalf by San Jetty. Owens Shark, OA an ophthalmic technician. The creation of this record  is the provider's dictation and/or activities during the visit.    Electronically signed by: San Jetty. Owens Shark, [place] 07.12.2023 1:41 AM  Gardiner Sleeper, M.D., Ph.D. Diseases & Surgery of the Retina and Vitreous [facility]  I have reviewed the above documentation for accuracy and completeness, and I agree with the above. Gardiner Sleeper, M.D., Ph.D. 12/28/21 1:42 AM   Abbreviations: M myopia (nearsighted); A astigmatism; H hyperopia (farsighted); P presbyopia; Mrx spectacle prescription;  CTL contact lenses; OD right eye; OS left eye; OU both eyes  XT exotropia; ET esotropia; PEK punctate epithelial keratitis; PEE punctate epithelial erosions; DES dry eye  syndrome; MGD meibomian gland dysfunction; ATs artificial tears; PFAT's preservative free artificial tears; [place] nuclear sclerotic cataract; PSC posterior subcapsular cataract; ERM epi-retinal membrane; PVD posterior vitreous detachment; RD retinal detachment; DM diabetes mellitus; DR diabetic retinopathy; NPDR non-proliferative diabetic retinopathy; PDR proliferative diabetic retinopathy; CSME clinically significant macular edema; DME diabetic macular edema; dbh dot blot hemorrhages; CWS cotton wool spot; POAG primary open angle glaucoma; C/D cup-to-disc ratio; HVF humphrey visual field; GVF goldmann visual field; OCT optical coherence tomography; IOP intraocular pressure; BRVO Branch retinal vein occlusion; CRVO central retinal vein occlusion; CRAO central retinal artery occlusion; BRAO branch retinal artery occlusion; RT retinal tear; SB scleral buckle; PPV pars plana vitrectomy; VH Vitreous hemorrhage; PRP panretinal laser photocoagulation; IVK intravitreal kenalog; VMT vitreomacular traction; MH Macular hole;  NVD neovascularization of the disc; NVE neovascularization elsewhere; AREDS age related eye disease study; ARMD age related macular degeneration; POAG primary open angle glaucoma; EBMD epithelial/anterior basement membrane  dystrophy; ACIOL anterior chamber intraocular lens; IOL intraocular lens; PCIOL posterior chamber intraocular lens; Phaco/IOL phacoemulsification with intraocular lens placement; [place] photorefractive keratectomy; LASIK laser assisted in situ keratomileusis; HTN hypertension; DM diabetes mellitus; COPD chronic obstructive pulmonary disease

## 2021-12-20 DIAGNOSIS — M25561 Pain in right knee: Secondary | ICD-10-CM | POA: Diagnosis not present

## 2021-12-20 DIAGNOSIS — M1711 Unilateral primary osteoarthritis, right knee: Secondary | ICD-10-CM | POA: Diagnosis not present

## 2021-12-25 ENCOUNTER — Ambulatory Visit (INDEPENDENT_AMBULATORY_CARE_PROVIDER_SITE_OTHER): Payer: Medicare HMO | Admitting: Ophthalmology

## 2021-12-25 ENCOUNTER — Encounter (INDEPENDENT_AMBULATORY_CARE_PROVIDER_SITE_OTHER): Payer: Self-pay | Admitting: Ophthalmology

## 2021-12-25 DIAGNOSIS — H33311 Horseshoe tear of retina without detachment, right eye: Secondary | ICD-10-CM | POA: Diagnosis not present

## 2021-12-25 DIAGNOSIS — H35033 Hypertensive retinopathy, bilateral: Secondary | ICD-10-CM | POA: Diagnosis not present

## 2021-12-25 DIAGNOSIS — H3322 Serous retinal detachment, left eye: Secondary | ICD-10-CM | POA: Diagnosis not present

## 2021-12-25 DIAGNOSIS — H35352 Cystoid macular degeneration, left eye: Secondary | ICD-10-CM | POA: Diagnosis not present

## 2021-12-25 DIAGNOSIS — H25813 Combined forms of age-related cataract, bilateral: Secondary | ICD-10-CM | POA: Diagnosis not present

## 2021-12-25 DIAGNOSIS — M1711 Unilateral primary osteoarthritis, right knee: Secondary | ICD-10-CM | POA: Diagnosis not present

## 2021-12-25 DIAGNOSIS — I10 Essential (primary) hypertension: Secondary | ICD-10-CM

## 2021-12-25 DIAGNOSIS — M25561 Pain in right knee: Secondary | ICD-10-CM | POA: Diagnosis not present

## 2021-12-26 ENCOUNTER — Other Ambulatory Visit (INDEPENDENT_AMBULATORY_CARE_PROVIDER_SITE_OTHER): Payer: Self-pay | Admitting: Ophthalmology

## 2021-12-26 DIAGNOSIS — M1712 Unilateral primary osteoarthritis, left knee: Secondary | ICD-10-CM | POA: Diagnosis not present

## 2021-12-26 DIAGNOSIS — M25562 Pain in left knee: Secondary | ICD-10-CM | POA: Diagnosis not present

## 2021-12-28 ENCOUNTER — Encounter (INDEPENDENT_AMBULATORY_CARE_PROVIDER_SITE_OTHER): Payer: Self-pay | Admitting: Ophthalmology

## 2022-01-01 DIAGNOSIS — M1711 Unilateral primary osteoarthritis, right knee: Secondary | ICD-10-CM | POA: Diagnosis not present

## 2022-01-01 DIAGNOSIS — M25561 Pain in right knee: Secondary | ICD-10-CM | POA: Diagnosis not present

## 2022-01-01 DIAGNOSIS — M25562 Pain in left knee: Secondary | ICD-10-CM | POA: Diagnosis not present

## 2022-01-01 DIAGNOSIS — M170 Bilateral primary osteoarthritis of knee: Secondary | ICD-10-CM | POA: Diagnosis not present

## 2022-01-02 DIAGNOSIS — M1712 Unilateral primary osteoarthritis, left knee: Secondary | ICD-10-CM | POA: Diagnosis not present

## 2022-01-02 DIAGNOSIS — M25562 Pain in left knee: Secondary | ICD-10-CM | POA: Diagnosis not present

## 2022-01-10 DIAGNOSIS — M1990 Unspecified osteoarthritis, unspecified site: Secondary | ICD-10-CM | POA: Diagnosis not present

## 2022-01-10 DIAGNOSIS — Z23 Encounter for immunization: Secondary | ICD-10-CM | POA: Diagnosis not present

## 2022-01-10 DIAGNOSIS — I10 Essential (primary) hypertension: Secondary | ICD-10-CM | POA: Diagnosis not present

## 2022-01-10 DIAGNOSIS — J45909 Unspecified asthma, uncomplicated: Secondary | ICD-10-CM | POA: Diagnosis not present

## 2022-01-10 DIAGNOSIS — K219 Gastro-esophageal reflux disease without esophagitis: Secondary | ICD-10-CM | POA: Diagnosis not present

## 2022-01-10 DIAGNOSIS — M810 Age-related osteoporosis without current pathological fracture: Secondary | ICD-10-CM | POA: Diagnosis not present

## 2022-01-10 DIAGNOSIS — E7800 Pure hypercholesterolemia, unspecified: Secondary | ICD-10-CM | POA: Diagnosis not present

## 2022-01-10 DIAGNOSIS — J309 Allergic rhinitis, unspecified: Secondary | ICD-10-CM | POA: Diagnosis not present

## 2022-01-10 DIAGNOSIS — E559 Vitamin D deficiency, unspecified: Secondary | ICD-10-CM | POA: Diagnosis not present

## 2022-01-10 DIAGNOSIS — Z6841 Body Mass Index (BMI) 40.0 and over, adult: Secondary | ICD-10-CM | POA: Diagnosis not present

## 2022-01-10 DIAGNOSIS — R609 Edema, unspecified: Secondary | ICD-10-CM | POA: Diagnosis not present

## 2022-01-16 NOTE — Progress Notes (Signed)
[facility] Note  01/22/2022     CHIEF COMPLAINT Patient presents for Retina Follow Up   HISTORY OF PRESENT ILLNESS: Alicia Miranda is a 66 y.o. female who presents to the clinic today for:   HPI     Retina Follow Up   Patient presents with  Retinal Break/Detachment.  In left eye.  Severity is moderate.  Duration of 4 weeks.  Since onset it is gradually improving.  I, the attending physician,  performed the HPI with the patient and updated documentation appropriately.        Comments   Pt here for 4 wk ret f/u RD OS. Pt states [facility] in OS is 'getting there'. She can see, but cant see. Pt has cataract sx scheduled in October for OS, OD is in November.       Last edited by [name], MD on 01/24/2022  1:03 AM.    Pt has left eye cataract sx scheduled for October 4th and right eye for November 1st  Referring physician: Antony Contras, MD [address],  [place] [id number]  HISTORICAL INFORMATION:   Selected notes from the MEDICAL RECORD NUMBER Referred by Dr. Madilyn Hook for possible RD OS LEE: 03/16/02023 Ocular Hx-  PMH- HTN    CURRENT MEDICATIONS: Current Outpatient Medications (Ophthalmic Drugs)  Medication Sig   Bromfenac Sodium (PROLENSA) 0.07 % SOLN Place 1 drop into the left eye 4 (four) times daily.   LOTEPREDNOL ETABONATE OP Place 1 drop into the right eye 4 (four) times daily.   prednisoLONE acetate (PRED FORTE) 1 % ophthalmic suspension INSTILL 1 DROP INTO LEFT EYE 4 TIMES DAILY   No current facility-administered medications for this visit. (Ophthalmic Drugs)   Current Outpatient Medications (Other)  Medication Sig   APPLE CIDER VINEGAR PO Take 2 tablets by mouth daily. gummies   Cholecalciferol (VITAMIN D3) 10 MCG (400 UNIT) tablet Take 400 Units by mouth daily.   CINNAMON PO Take 1,000 mg by mouth every other day.   COLLAGEN PO Take 3 capsules by mouth daily.   Denosumab (PROLIA [place]) Inject 60 mg into the  skin every 6 (six) months.   FEROSUL 325 (65 Fe) MG tablet Take 325 mg by mouth daily.   fexofenadine (ALLEGRA) 180 MG tablet Take 180 mg by mouth daily.   fluticasone (FLONASE) 50 MCG/ACT nasal spray Place 1 spray into both nostrils daily.   furosemide (LASIX) 40 MG tablet Take 40 mg by mouth 2 (two) times daily.   Glucosamine HCl (GLUCOSAMINE PO) Take 2 tablets by mouth daily.   HYDROcodone-acetaminophen (NORCO/VICODIN) 5-325 MG tablet Take 1 tablet by mouth every 4 (four) hours as needed for moderate pain.   losartan (COZAAR) 50 MG tablet Take 100 mg by mouth daily.   Magnesium 400 MG CAPS Take 400 mg by mouth daily.   Melatonin 5 MG CAPS Take 10 mg by mouth at bedtime.   pantoprazole (PROTONIX) 40 MG tablet Take 40 mg by mouth 2 (two) times daily.   rosuvastatin (CRESTOR) 20 MG tablet Take 20 mg by mouth daily.   TURMERIC PO Take 2 tablets by mouth daily.   No current facility-administered medications for this visit. (Other)   REVIEW OF SYSTEMS: ROS   Positive for: Musculoskeletal, Cardiovascular, Eyes, Respiratory Negative for: Constitutional, Gastrointestinal, Neurological, Skin, Genitourinary, HENT, Endocrine, Psychiatric, Allergic/Imm, Heme/Lymph Last edited by Kingsley Spittle, COT on 01/22/2022  1:15 PM.     ALLERGIES Allergies  Allergen Reactions   Codeine Itching   PAST MEDICAL HISTORY Past Medical History:  Diagnosis Date   Allergy    Arthritis    legs   Asthma    GERD (gastroesophageal reflux disease)    Hyperlipidemia    Hypertension    Past Surgical History:  Procedure Laterality Date   ABDOMINAL HYSTERECTOMY     CHOLECYSTECTOMY     COLONOSCOPY     GAS INSERTION Left 09/05/2021   Procedure: INSERTION OF GAS - C3F8;  Surgeon: [name], MD;  Location: [facility];  Service: Ophthalmology;  Laterality: Left;   GAS/FLUID EXCHANGE Left 09/05/2021   Procedure: GAS/FLUID EXCHANGE;  Surgeon: [name], MD;  Location: [facility];  Service: Ophthalmology;   Laterality: Left;   PARS PLANA VITRECTOMY Left 09/05/2021   Procedure: PARS PLANA VITRECTOMY WITH 25 GAUGE;  Surgeon: [name], MD;  Location: [facility];  Service: Ophthalmology;  Laterality: Left;   PERFLUORONE INJECTION Left 09/05/2021   Procedure: PERFLUORON INJECTION;  Surgeon: [name], MD;  Location: [facility];  Service: Ophthalmology;  Laterality: Left;   PHOTOCOAGULATION WITH LASER Left 09/05/2021   Procedure: PHOTOCOAGULATION WITH LASER;  Surgeon: [name], MD;  Location: [facility];  Service: Ophthalmology;  Laterality: Left;   SCLERAL BUCKLE Left 09/05/2021   Procedure: SCLERAL BUCKLE;  Surgeon: [name], MD;  Location: [facility];  Service: Ophthalmology;  Laterality: Left;   UPPER GASTROINTESTINAL ENDOSCOPY     FAMILY HISTORY Family History  Problem Relation Age of Onset   Retinal detachment Sister    Diabetes Sister    Glaucoma Brother    Diabetes Brother    Colon cancer Brother        dx at 17   Esophageal cancer Neg Hx    Rectal cancer Neg Hx    Stomach cancer Neg Hx    SOCIAL HISTORY Social History   Tobacco Use   Smoking status: Former   Smokeless tobacco: Never   Tobacco comments:    quit smoking in 2002 or 2003  Vaping Use   Vaping Use: Never used  Substance Use Topics   Alcohol use: Yes    Comment: very rarely   Drug use: No       OPHTHALMIC EXAM:  Base Eye Exam     Visual Acuity (Snellen - Linear)       Right Left   Dist cc 20/20 -2 CF at 3'   Dist ph cc  20/200 -1    Correction: Glasses         Tonometry (Tonopen, 1:21 PM)       Right Left   Pressure 16 18         Pupils       Dark Light Shape React APD   Right 2 1 Round Brisk None   Left 3 2 Round Brisk None         Visual Fields (Counting fingers)       Left Right    Full Full         Extraocular Movement       Right Left    Full, Ortho Full, Ortho         Neuro/Psych     Oriented x3: Yes   Mood/Affect: Normal         Dilation     Both eyes: 1.0%  Mydriacyl, 2.5% Phenylephrine @ 1:22 PM           Slit Lamp and Fundus Exam  External Exam       Right Left   External  Periorbital edema -- improved         Slit Lamp Exam       Right Left   Lids/Lashes Dermatochalasis - upper lid, mild Meibomian gland dysfunction Dermatochalasis - upper lid   Conjunctiva/Sclera white and quiet White and quiet, sutures dissolved   Cornea trace PEE Trace Punctate epithelial erosions   Anterior Chamber deep and clear deep and clear   Iris round and dilated round and dilated, anterior bowing   Lens 2+ NS, 2+ cortical 3+ NS with brunescence, 2-3+ cortical, 1+ Posterior subcapsular cataract   Anterior Vitreous syneresis, asteroid hyalosis, PVD post vitrectomy, gas bubble gone         Fundus Exam       Right Left   Disc pink and sharp Pink and Sharp   C/D Ratio 0.5 0.5   Macula flat, good foveal reflex, no heme or edema Flat, Blunted foveal reflex, RPE mottling, cystic changes centrally and nasally - persistent, No heme or edema   Vessels attenuated, Tortuous attenuated, Tortuous   Periphery Attached, operculated hole @ 1030 with mild surrounding pigment -- good laser surrounding, no SRF, no new RT/RD Retina attached over buckle, good buckle height, good laser over buckle and around tears, PRE-OP: small tear with fibrosis @ 0200, bullous, temporal RD from 1230 to 0430           Refraction     Wearing Rx       Sphere Cylinder Axis Add   Right +1.00 +1.00 130 +2.75   Left +1.25 +0.75 017 +2.75           IMAGING AND PROCEDURES  Imaging and Procedures for 01/22/2022  OCT, Retina - OU - Both Eyes       Right Eye Quality was good. Central Foveal Thickness: 276. Progression has been stable. Findings include normal foveal contour, no IRF, no SRF.   Left Eye Quality was good. Central Foveal Thickness: 302. Progression has improved. Findings include no SRF, abnormal foveal contour, epiretinal membrane, intraretinal fluid, macular  pucker (Retina stably re-attached, ERM with blunted foveal contour and mild pucker, persistent IRF / CME nasal macula and fovea, focal pockets of SRF superior to disc -- slightly improved).   Notes *Images captured and stored on drive  Diagnosis / Impression:  OD: NFP; no IRF/SRF OS: Retina stably re-attached, ERM with blunted foveal contour and mild pucker, persistent IRF / CME nasal macula and fovea, focal pockets of SRF superior to disc -- slightly improved  Clinical management:  See below  Abbreviations: NFP - Normal foveal profile. CME - cystoid macular edema. PED - pigment epithelial detachment. IRF - intraretinal fluid. SRF - subretinal fluid. EZ - ellipsoid zone. ERM - epiretinal membrane. ORA - outer retinal atrophy. ORT - outer retinal tubulation. SRHM - subretinal hyper-reflective material. IRHM - intraretinal hyper-reflective material            ASSESSMENT/PLAN:    ICD-10-CM   1. Left retinal detachment  H33.22     2. Cystoid macular edema of left eye  H35.352 OCT, Retina - OU - Both Eyes    3. Retinal tear of right eye  H33.311     4. Essential hypertension  I10     5. Hypertensive retinopathy of both eyes  H35.033     6. Combined forms of age-related cataract of both eyes  H25.813       1,2. Rhegmatogenous retinal  detachment with retinal hole, left eye - bullous, temporal, mac off detachment, onset of foveal involvement Tuesday, 03.14.23 by history - detached from 1230 to 430 oclock, fovea off, small tear at 0200 - s/p SBP + PPV/PFO/EL/FAX/14% C3F8 OS, 03.23.2023 - retina attached and in good position -- good buckle height and laser around breaks  - BCVA OS 20/200 -- limited by post-vitrectomy cataract  - OCT shows Retina stably re-attached, ERM with blunted foveal contour and mild pucker, persistent IRF / CME nasal macula and fovea, focal pockets of SRF superior to disc -- slightly improved             - IOP good at 18  - gas bubble gone             -  continue PF and Prolensa QID OS for CME             - d/c face down positioning  - f/u 6 weeks - POV OU, DFE OU per MD  3. Retinal hole OD  - operculated retinal hole located at 1030 with mild surrounding pigment, no SRF - s/p laser retinopexy OD (03.16.23) -- good laser surrounding - monitor   4,5. Hypertensive retinopathy OU - discussed importance of tight BP control - monitor   6. Mixed age-related cataracts OU  - The symptoms of cataract, surgical options, and treatments and risks were discussed with patient. - discussed diagnosis and progression, specifically post-vitrectomy progression OS - surgery scheduled for October 4th (OS) and November 1st (OD) with Dr. Lucianne Lei  Ophthalmic Meds Ordered this visit:  No orders of the defined types were placed in this encounter.    Return in about 6 weeks (around 03/05/2022) for RD and ERM/CME OS, DFE, OCT.  There are no Patient Instructions on file for this visit.   Explained the diagnoses, plan, and follow up with the patient and they expressed understanding.  Patient expressed understanding of the importance of proper follow up care.   This document serves as a record of services personally performed by Gardiner Sleeper, MD, PhD. It was created on their behalf by Orvan Falconer, an ophthalmic technician. The creation of this record is the provider's dictation and/or activities during the visit.    Electronically signed by: Orvan Falconer, OA, 01/24/22  1:05 AM  This document serves as a record of services personally performed by Gardiner Sleeper, MD, PhD. It was created on their behalf by San Jetty. Owens Shark, OA an ophthalmic technician. The creation of this record is the provider's dictation and/or activities during the visit.    Electronically signed by: San Jetty. Owens Shark, [place] 08.09.2023 1:05 AM  Gardiner Sleeper, M.D., Ph.D. Diseases & Surgery of the Retina and Vitreous [facility]  I have reviewed the above  documentation for accuracy and completeness, and I agree with the above. Gardiner Sleeper, M.D., Ph.D. 01/24/22 1:07 AM   Abbreviations: M myopia (nearsighted); A astigmatism; H hyperopia (farsighted); P presbyopia; Mrx spectacle prescription;  CTL contact lenses; OD right eye; OS left eye; OU both eyes  XT exotropia; ET esotropia; PEK punctate epithelial keratitis; PEE punctate epithelial erosions; DES dry eye syndrome; MGD meibomian gland dysfunction; ATs artificial tears; PFAT's preservative free artificial tears; [place] nuclear sclerotic cataract; PSC posterior subcapsular cataract; ERM epi-retinal membrane; PVD posterior vitreous detachment; RD retinal detachment; DM diabetes mellitus; DR diabetic retinopathy; NPDR non-proliferative diabetic retinopathy; PDR proliferative diabetic retinopathy; CSME clinically significant macular edema; DME diabetic macular edema; dbh dot  blot hemorrhages; CWS cotton wool spot; POAG primary open angle glaucoma; C/D cup-to-disc ratio; HVF humphrey visual field; GVF goldmann visual field; OCT optical coherence tomography; IOP intraocular pressure; BRVO Branch retinal vein occlusion; CRVO central retinal vein occlusion; CRAO central retinal artery occlusion; BRAO branch retinal artery occlusion; RT retinal tear; SB scleral buckle; PPV pars plana vitrectomy; VH Vitreous hemorrhage; PRP panretinal laser photocoagulation; IVK intravitreal kenalog; VMT vitreomacular traction; MH Macular hole;  NVD neovascularization of the disc; NVE neovascularization elsewhere; AREDS age related eye disease study; ARMD age related macular degeneration; POAG primary open angle glaucoma; EBMD epithelial/anterior basement membrane dystrophy; ACIOL anterior chamber intraocular lens; IOL intraocular lens; PCIOL posterior chamber intraocular lens; Phaco/IOL phacoemulsification with intraocular lens placement; [place] photorefractive keratectomy; LASIK laser assisted in situ keratomileusis; HTN hypertension; DM  diabetes mellitus; COPD chronic obstructive pulmonary disease

## 2022-01-17 DIAGNOSIS — H35033 Hypertensive retinopathy, bilateral: Secondary | ICD-10-CM | POA: Diagnosis not present

## 2022-01-17 DIAGNOSIS — H33311 Horseshoe tear of retina without detachment, right eye: Secondary | ICD-10-CM | POA: Diagnosis not present

## 2022-01-17 DIAGNOSIS — H25813 Combined forms of age-related cataract, bilateral: Secondary | ICD-10-CM | POA: Diagnosis not present

## 2022-01-17 DIAGNOSIS — H25812 Combined forms of age-related cataract, left eye: Secondary | ICD-10-CM | POA: Diagnosis not present

## 2022-01-17 DIAGNOSIS — H40053 Ocular hypertension, bilateral: Secondary | ICD-10-CM | POA: Diagnosis not present

## 2022-01-22 ENCOUNTER — Ambulatory Visit (INDEPENDENT_AMBULATORY_CARE_PROVIDER_SITE_OTHER): Payer: Medicare HMO | Admitting: Ophthalmology

## 2022-01-22 ENCOUNTER — Encounter (INDEPENDENT_AMBULATORY_CARE_PROVIDER_SITE_OTHER): Payer: Self-pay | Admitting: Ophthalmology

## 2022-01-22 DIAGNOSIS — H25813 Combined forms of age-related cataract, bilateral: Secondary | ICD-10-CM | POA: Diagnosis not present

## 2022-01-22 DIAGNOSIS — I10 Essential (primary) hypertension: Secondary | ICD-10-CM | POA: Diagnosis not present

## 2022-01-22 DIAGNOSIS — H3322 Serous retinal detachment, left eye: Secondary | ICD-10-CM | POA: Diagnosis not present

## 2022-01-22 DIAGNOSIS — H33311 Horseshoe tear of retina without detachment, right eye: Secondary | ICD-10-CM | POA: Diagnosis not present

## 2022-01-22 DIAGNOSIS — H35352 Cystoid macular degeneration, left eye: Secondary | ICD-10-CM | POA: Diagnosis not present

## 2022-01-22 DIAGNOSIS — H35033 Hypertensive retinopathy, bilateral: Secondary | ICD-10-CM | POA: Diagnosis not present

## 2022-01-24 ENCOUNTER — Encounter (INDEPENDENT_AMBULATORY_CARE_PROVIDER_SITE_OTHER): Payer: Self-pay | Admitting: Ophthalmology

## 2022-01-25 DIAGNOSIS — M1990 Unspecified osteoarthritis, unspecified site: Secondary | ICD-10-CM | POA: Diagnosis not present

## 2022-01-25 DIAGNOSIS — J301 Allergic rhinitis due to pollen: Secondary | ICD-10-CM | POA: Diagnosis not present

## 2022-01-25 DIAGNOSIS — M810 Age-related osteoporosis without current pathological fracture: Secondary | ICD-10-CM | POA: Diagnosis not present

## 2022-01-25 DIAGNOSIS — R609 Edema, unspecified: Secondary | ICD-10-CM | POA: Diagnosis not present

## 2022-01-25 DIAGNOSIS — Z6841 Body Mass Index (BMI) 40.0 and over, adult: Secondary | ICD-10-CM | POA: Diagnosis not present

## 2022-01-25 DIAGNOSIS — E611 Iron deficiency: Secondary | ICD-10-CM | POA: Diagnosis not present

## 2022-01-25 DIAGNOSIS — Z79620 Long term (current) use of immunosuppressive biologic: Secondary | ICD-10-CM | POA: Diagnosis not present

## 2022-01-25 DIAGNOSIS — I10 Essential (primary) hypertension: Secondary | ICD-10-CM | POA: Diagnosis not present

## 2022-01-25 DIAGNOSIS — E785 Hyperlipidemia, unspecified: Secondary | ICD-10-CM | POA: Diagnosis not present

## 2022-01-25 DIAGNOSIS — K219 Gastro-esophageal reflux disease without esophagitis: Secondary | ICD-10-CM | POA: Diagnosis not present

## 2022-01-25 DIAGNOSIS — Z79899 Other long term (current) drug therapy: Secondary | ICD-10-CM | POA: Diagnosis not present

## 2022-02-12 ENCOUNTER — Other Ambulatory Visit (INDEPENDENT_AMBULATORY_CARE_PROVIDER_SITE_OTHER): Payer: Self-pay | Admitting: Ophthalmology

## 2022-02-18 ENCOUNTER — Other Ambulatory Visit (INDEPENDENT_AMBULATORY_CARE_PROVIDER_SITE_OTHER): Payer: Self-pay

## 2022-02-18 MED ORDER — PREDNISOLONE ACETATE 1 % OP SUSP: OPHTHALMIC | 0 refills | Status: DC

## 2022-03-04 NOTE — Progress Notes (Signed)
[facility] Note  03/05/2022     CHIEF COMPLAINT Patient presents for Retina Follow Up   HISTORY OF PRESENT ILLNESS: Alicia Miranda is a 66 y.o. female who presents to the clinic today for:   HPI     Retina Follow Up   Patient presents with  Other.  In left eye.  This started 6 weeks ago.  I, the attending physician,  performed the HPI with the patient and updated documentation appropriately.        Comments   Patient here for 6 weeks retina follow up for RD OS. Patient states vision about the same. No eye pain.      Last edited by [name], MD on 03/08/2022 12:09 AM.    Pt states she has not noticed any change in vision, she is on PF and Prolensa BID OS, Dr. Lucianne Lei has her on dorzolamide TID OS  Referring physician: Antony Contras, MD [place],  [place] [address]  HISTORICAL INFORMATION:   Selected notes from the MEDICAL RECORD NUMBER Referred by Dr. Madilyn Hook for possible RD OS LEE: 03/16/02023 Ocular Hx-  PMH- HTN    CURRENT MEDICATIONS: Current Outpatient Medications (Ophthalmic Drugs)  Medication Sig   brimonidine (ALPHAGAN) 0.2 % ophthalmic solution Place 1 drop into the left eye 3 (three) times daily.   Bromfenac Sodium (PROLENSA) 0.07 % SOLN Place 1 drop into the left eye 4 (four) times daily.   LOTEPREDNOL ETABONATE OP Place 1 drop into the right eye 4 (four) times daily.   prednisoLONE acetate (PRED FORTE) 1 % ophthalmic suspension INSTILL 1 DROP INTO LEFT EYE 4 TIMES DAILY   No current facility-administered medications for this visit. (Ophthalmic Drugs)   Current Outpatient Medications (Other)  Medication Sig   APPLE CIDER VINEGAR PO Take 2 tablets by mouth daily. gummies   Cholecalciferol (VITAMIN D3) 10 MCG (400 UNIT) tablet Take 400 Units by mouth daily.   CINNAMON PO Take 1,000 mg by mouth every other day.   COLLAGEN PO Take 3 capsules by mouth daily.   Denosumab (PROLIA [place]) Inject 60 mg into  the skin every 6 (six) months.   FEROSUL 325 (65 Fe) MG tablet Take 325 mg by mouth daily.   fexofenadine (ALLEGRA) 180 MG tablet Take 180 mg by mouth daily.   fluticasone (FLONASE) 50 MCG/ACT nasal spray Place 1 spray into both nostrils daily.   Glucosamine HCl (GLUCOSAMINE PO) Take 2 tablets by mouth daily.   HYDROcodone-acetaminophen (NORCO/VICODIN) 5-325 MG tablet Take 1 tablet by mouth every 4 (four) hours as needed for moderate pain.   losartan (COZAAR) 50 MG tablet Take 100 mg by mouth daily.   Magnesium 400 MG CAPS Take 400 mg by mouth daily.   Melatonin 5 MG CAPS Take 10 mg by mouth at bedtime.   pantoprazole (PROTONIX) 40 MG tablet Take 40 mg by mouth 2 (two) times daily.   rosuvastatin (CRESTOR) 20 MG tablet Take 20 mg by mouth daily.   TURMERIC PO Take 2 tablets by mouth daily.   furosemide (LASIX) 40 MG tablet Take 40 mg by mouth 2 (two) times daily.   No current facility-administered medications for this visit. (Other)   REVIEW OF SYSTEMS: ROS   Positive for: Musculoskeletal, Cardiovascular, Eyes, Respiratory Negative for: Constitutional, Gastrointestinal, Neurological, Skin, Genitourinary, HENT, Endocrine, Psychiatric, Allergic/Imm, Heme/Lymph Last edited by Theodore Demark, COA on 03/05/2022  1:26 PM.     ALLERGIES Allergies  Allergen Reactions   Codeine Itching   PAST MEDICAL HISTORY Past Medical History:  Diagnosis Date   Allergy    Arthritis    legs   Asthma    GERD (gastroesophageal reflux disease)    Hyperlipidemia    Hypertension    Past Surgical History:  Procedure Laterality Date   ABDOMINAL HYSTERECTOMY     CHOLECYSTECTOMY     COLONOSCOPY     GAS INSERTION Left 09/05/2021   Procedure: INSERTION OF GAS - C3F8;  Surgeon: [name], MD;  Location: [place];  Service: Ophthalmology;  Laterality: Left;   GAS/FLUID EXCHANGE Left 09/05/2021   Procedure: GAS/FLUID EXCHANGE;  Surgeon: [name], MD;  Location: [place];  Service: Ophthalmology;   Laterality: Left;   PARS PLANA VITRECTOMY Left 09/05/2021   Procedure: PARS PLANA VITRECTOMY WITH 25 GAUGE;  Surgeon: [name], MD;  Location: [place];  Service: Ophthalmology;  Laterality: Left;   PERFLUORONE INJECTION Left 09/05/2021   Procedure: PERFLUORON INJECTION;  Surgeon: [name], MD;  Location: [place];  Service: Ophthalmology;  Laterality: Left;   PHOTOCOAGULATION WITH LASER Left 09/05/2021   Procedure: PHOTOCOAGULATION WITH LASER;  Surgeon: [name], MD;  Location: [place];  Service: Ophthalmology;  Laterality: Left;   SCLERAL BUCKLE Left 09/05/2021   Procedure: SCLERAL BUCKLE;  Surgeon: [name], MD;  Location: [place];  Service: Ophthalmology;  Laterality: Left;   UPPER GASTROINTESTINAL ENDOSCOPY     FAMILY HISTORY Family History  Problem Relation Age of Onset   Retinal detachment Sister    Diabetes Sister    Glaucoma Brother    Diabetes Brother    Colon cancer Brother        dx at 67   Esophageal cancer Neg Hx    Rectal cancer Neg Hx    Stomach cancer Neg Hx    SOCIAL HISTORY Social History   Tobacco Use   Smoking status: Former   Smokeless tobacco: Never   Tobacco comments:    quit smoking in 2002 or 2003  Vaping Use   Vaping Use: Never used  Substance Use Topics   Alcohol use: Yes    Comment: very rarely   Drug use: No       OPHTHALMIC EXAM:  Base Eye Exam     Visual Acuity (Snellen - Linear)       Right Left   Dist cc 20/20 -1 20/800   Dist ph cc  20/200 -2    Correction: Glasses         Tonometry (Tonopen, 1:23 PM)       Right Left   Pressure 14 26,27         Pupils       Dark Light Shape React APD   Right 2 1 Round Brisk None   Left 3 2 Round Brisk None         Visual Fields (Counting fingers)       Left Right    Full Full         Extraocular Movement       Right Left    Full, Ortho Full, Ortho         Neuro/Psych     Oriented x3: Yes   Mood/Affect: Normal         Dilation     Both eyes:  1.0% Mydriacyl, 2.5% Phenylephrine @ 1:23 PM           Slit Lamp and Fundus Exam     External  Exam       Right Left   External  Periorbital edema -- improved         Slit Lamp Exam       Right Left   Lids/Lashes Dermatochalasis - upper lid, mild Meibomian gland dysfunction Dermatochalasis - upper lid   Conjunctiva/Sclera white and quiet White and quiet, sutures dissolved   Cornea trace PEE Trace Punctate epithelial erosions   Anterior Chamber deep and clear deep and clear   Iris round and dilated round and dilated, anterior bowing   Lens 2+ NS, 2+ cortical 3-4+ NS with brunescence, 3+ cortical, 1+ Posterior subcapsular cataract   Anterior Vitreous syneresis, asteroid hyalosis, PVD post vitrectomy, gas bubble gone         Fundus Exam       Right Left   Disc pink and sharp Pink and Sharp   C/D Ratio 0.5 0.5   Macula flat, good foveal reflex, no heme or edema Flat, Blunted foveal reflex, RPE mottling, cystic changes centrally and nasally - slightly improved, No heme   Vessels attenuated, Tortuous attenuated, Tortuous   Periphery Attached, operculated hole @ 1030 with mild surrounding pigment -- good laser surrounding, no SRF, no new RT/RD Retina attached over buckle, good buckle height, good laser over buckle and around tears, PRE-OP: small tear with fibrosis @ 0200, bullous, temporal RD from 1230 to 0430           Refraction     Wearing Rx       Sphere Cylinder Axis Add   Right +1.00 +1.00 130 +2.75   Left +1.25 +0.75 017 +2.75           IMAGING AND PROCEDURES  Imaging and Procedures for 03/05/2022  OCT, Retina - OU - Both Eyes       Right Eye Quality was good. Central Foveal Thickness: 275. Progression has been stable. Findings include normal foveal contour, no IRF, no SRF.   Left Eye Quality was good. Central Foveal Thickness: 319. Progression has improved. Findings include no SRF, abnormal foveal contour, epiretinal membrane, intraretinal fluid,  macular pucker (Retina stably re-attached, ERM with blunted foveal contour and mild pucker, mild interval improvement in cystic changes nasal macula and fovea, focal pockets of SRF superior to disc -- slightly improved).   Notes *Images captured and stored on drive  Diagnosis / Impression:  OD: NFP; no IRF/SRF OS: Retina stably re-attached, ERM with blunted foveal contour and mild pucker, mild interval improvement in cystic changes nasal macula and fovea, focal pockets of SRF superior to disc -- slightly improved  Clinical management:  See below  Abbreviations: NFP - Normal foveal profile. CME - cystoid macular edema. PED - pigment epithelial detachment. IRF - intraretinal fluid. SRF - subretinal fluid. EZ - ellipsoid zone. ERM - epiretinal membrane. ORA - outer retinal atrophy. ORT - outer retinal tubulation. SRHM - subretinal hyper-reflective material. IRHM - intraretinal hyper-reflective material            ASSESSMENT/PLAN:    ICD-10-CM   1. Left retinal detachment  H33.22     2. Cystoid macular edema of left eye  H35.352 OCT, Retina - OU - Both Eyes    3. Retinal tear of right eye  H33.311     4. Essential hypertension  I10     5. Hypertensive retinopathy of both eyes  H35.033     6. Combined forms of age-related cataract of both eyes  H25.813      1,2. Rhegmatogenous  retinal detachment with retinal hole, left eye - bullous, temporal, mac off detachment, onset of foveal involvement Tuesday, 03.14.23 by history - detached from 1230 to 430 oclock, fovea off, small tear at 0200 - s/p SBP + PPV/PFO/EL/FAX/14% C3F8 OS, 03.23.2023 - retina attached and in good position -- good buckle height and laser around breaks  - BCVA OS 20/200 -- limited by post-vitrectomy cataract  - OCT shows Retina stably re-attached, ERM with blunted foveal contour and mild pucker, mild interval improvement in cystic changes nasal macula and fovea, focal pockets of SRF superior to disc -- slightly  improved             - IOP elevated at 26,27 OS             - continue PF and Prolensa QID OS for CME  - continue dorzolamide TID OS per Dr. Lucianne Lei  - add brimonidine BID OS  - f/u week of October 23 - POV OU, DFE OU per MD  3. Retinal hole OD  - operculated retinal hole located at 1030 with mild surrounding pigment, no SRF - s/p laser retinopexy OD (03.16.23) -- good laser surrounding - monitor   4,5. Hypertensive retinopathy OU - discussed importance of tight BP control - monitor   6. Mixed age-related cataracts OU  - The symptoms of cataract, surgical options, and treatments and risks were discussed with patient. - discussed diagnosis and progression, specifically post-vitrectomy progression OS - surgery scheduled for October 4th (OS) and November 1st (OD) with Dr. Lucianne Lei  Ophthalmic Meds Ordered this visit:  Meds ordered this encounter  Medications   brimonidine (ALPHAGAN) 0.2 % ophthalmic solution    Sig: Place 1 drop into the left eye 3 (three) times daily.    Dispense:  10 mL    Refill:  1     Return for f/u week of October 23, RD OS, DFE, OCT.  There are no Patient Instructions on file for this visit.   Explained the diagnoses, plan, and follow up with the patient and they expressed understanding.  Patient expressed understanding of the importance of proper follow up care.   This document serves as a record of services personally performed by Gardiner Sleeper, MD, PhD. It was created on their behalf by Orvan Falconer, an ophthalmic technician. The creation of this record is the provider's dictation and/or activities during the visit.    Electronically signed by: Orvan Falconer, OA, 03/08/22  12:12 AM  This document serves as a record of services personally performed by Gardiner Sleeper, MD, PhD. It was created on their behalf by San Jetty. Owens Shark, OA an ophthalmic technician. The creation of this record is the provider's dictation and/or activities during the visit.     Electronically signed by: San Jetty. Owens Shark, [place] 09.20.2023 12:12 AM  Gardiner Sleeper, M.D., Ph.D. Diseases & Surgery of the Retina and Vitreous [facility]  I have reviewed the above documentation for accuracy and completeness, and I agree with the above. Gardiner Sleeper, M.D., Ph.D. 03/08/22 12:12 AM   Abbreviations: M myopia (nearsighted); A astigmatism; H hyperopia (farsighted); P presbyopia; Mrx spectacle prescription;  CTL contact lenses; OD right eye; OS left eye; OU both eyes  XT exotropia; ET esotropia; PEK punctate epithelial keratitis; PEE punctate epithelial erosions; DES dry eye syndrome; MGD meibomian gland dysfunction; ATs artificial tears; PFAT's preservative free artificial tears; [place] nuclear sclerotic cataract; PSC posterior subcapsular cataract; ERM epi-retinal membrane; PVD posterior vitreous detachment;  RD retinal detachment; DM diabetes mellitus; DR diabetic retinopathy; NPDR non-proliferative diabetic retinopathy; PDR proliferative diabetic retinopathy; CSME clinically significant macular edema; DME diabetic macular edema; dbh dot blot hemorrhages; CWS cotton wool spot; POAG primary open angle glaucoma; C/D cup-to-disc ratio; HVF humphrey visual field; GVF goldmann visual field; OCT optical coherence tomography; IOP intraocular pressure; BRVO Branch retinal vein occlusion; CRVO central retinal vein occlusion; CRAO central retinal artery occlusion; BRAO branch retinal artery occlusion; RT retinal tear; SB scleral buckle; PPV pars plana vitrectomy; VH Vitreous hemorrhage; PRP panretinal laser photocoagulation; IVK intravitreal kenalog; VMT vitreomacular traction; MH Macular hole;  NVD neovascularization of the disc; NVE neovascularization elsewhere; AREDS age related eye disease study; ARMD age related macular degeneration; POAG primary open angle glaucoma; EBMD epithelial/anterior basement membrane dystrophy; ACIOL anterior chamber intraocular lens; IOL  intraocular lens; PCIOL posterior chamber intraocular lens; Phaco/IOL phacoemulsification with intraocular lens placement; [place] photorefractive keratectomy; LASIK laser assisted in situ keratomileusis; HTN hypertension; DM diabetes mellitus; COPD chronic obstructive pulmonary disease

## 2022-03-05 ENCOUNTER — Ambulatory Visit (INDEPENDENT_AMBULATORY_CARE_PROVIDER_SITE_OTHER): Payer: Medicare HMO | Admitting: Ophthalmology

## 2022-03-05 ENCOUNTER — Encounter (INDEPENDENT_AMBULATORY_CARE_PROVIDER_SITE_OTHER): Payer: Self-pay | Admitting: Ophthalmology

## 2022-03-05 DIAGNOSIS — H35352 Cystoid macular degeneration, left eye: Secondary | ICD-10-CM | POA: Diagnosis not present

## 2022-03-05 DIAGNOSIS — H33311 Horseshoe tear of retina without detachment, right eye: Secondary | ICD-10-CM

## 2022-03-05 DIAGNOSIS — H35033 Hypertensive retinopathy, bilateral: Secondary | ICD-10-CM | POA: Diagnosis not present

## 2022-03-05 DIAGNOSIS — H3322 Serous retinal detachment, left eye: Secondary | ICD-10-CM | POA: Diagnosis not present

## 2022-03-05 DIAGNOSIS — H25813 Combined forms of age-related cataract, bilateral: Secondary | ICD-10-CM | POA: Diagnosis not present

## 2022-03-05 DIAGNOSIS — I10 Essential (primary) hypertension: Secondary | ICD-10-CM | POA: Diagnosis not present

## 2022-03-05 DIAGNOSIS — M810 Age-related osteoporosis without current pathological fracture: Secondary | ICD-10-CM | POA: Diagnosis not present

## 2022-03-05 MED ORDER — BRIMONIDINE TARTRATE 0.2 % OP SOLN: 1.0000 [drp] | Freq: Three times a day (TID) | OPHTHALMIC | 1 refills | Status: DC

## 2022-03-08 ENCOUNTER — Encounter (INDEPENDENT_AMBULATORY_CARE_PROVIDER_SITE_OTHER): Payer: Self-pay | Admitting: Ophthalmology

## 2022-03-16 ENCOUNTER — Other Ambulatory Visit (INDEPENDENT_AMBULATORY_CARE_PROVIDER_SITE_OTHER): Payer: Self-pay | Admitting: Ophthalmology

## 2022-03-19 ENCOUNTER — Other Ambulatory Visit (INDEPENDENT_AMBULATORY_CARE_PROVIDER_SITE_OTHER): Payer: Self-pay

## 2022-03-19 DIAGNOSIS — H268 Other specified cataract: Secondary | ICD-10-CM | POA: Diagnosis not present

## 2022-03-19 DIAGNOSIS — H25812 Combined forms of age-related cataract, left eye: Secondary | ICD-10-CM | POA: Diagnosis not present

## 2022-03-19 MED ORDER — PROLENSA 0.07 % OP SOLN: 1.0000 [drp] | Freq: Four times a day (QID) | OPHTHALMIC | 6 refills | Status: DC

## 2022-04-03 NOTE — Progress Notes (Signed)
[facility] Note  04/07/2022    CHIEF COMPLAINT Patient presents for Retina Follow Up   HISTORY OF PRESENT ILLNESS: Alicia Miranda is a 66 y.o. female who presents to the clinic today for:   HPI     Retina Follow Up   Patient presents with  Other.  In left eye.  This started months ago.  Duration of 4 weeks.  Since onset it is stable.  I, the attending physician,  performed the HPI with the patient and updated documentation appropriately.        Comments   Patient states that she can not tell if there has been any vision changes at this time. She states that the eye wants to water a lot.       Last edited by [name], MD on 04/07/2022 11:26 AM.    Pt had OS cataract sx with Dr. Lucianne Lei on 10.04.23, pt states vision is a little better, but still blurry, OD surgery is scheduled for 11.01.23  Referring physician: Lisabeth Pick, MD [place],  [place] [id number]  HISTORICAL INFORMATION:   Selected notes from the MEDICAL RECORD NUMBER Referred by Dr. Madilyn Hook for possible RD OS LEE: 03/16/02023 Ocular Hx-  PMH- HTN    CURRENT MEDICATIONS: Current Outpatient Medications (Ophthalmic Drugs)  Medication Sig   brimonidine (ALPHAGAN) 0.2 % ophthalmic solution Place 1 drop into the left eye 3 (three) times daily.   Bromfenac Sodium (PROLENSA) 0.07 % SOLN Place 1 drop into the left eye 4 (four) times daily.   LOTEPREDNOL ETABONATE OP Place 1 drop into the right eye 4 (four) times daily.   prednisoLONE acetate (PRED FORTE) 1 % ophthalmic suspension INSTILL 1 DROP INTO LEFT EYE 4 TIMES DAILY   No current facility-administered medications for this visit. (Ophthalmic Drugs)   Current Outpatient Medications (Other)  Medication Sig   APPLE CIDER VINEGAR PO Take 2 tablets by mouth daily. gummies   Cholecalciferol (VITAMIN D3) 10 MCG (400 UNIT) tablet Take 400 Units by mouth daily.   CINNAMON PO Take 1,000 mg by mouth every other day.    COLLAGEN PO Take 3 capsules by mouth daily.   Denosumab (PROLIA [place]) Inject 60 mg into the skin every 6 (six) months.   FEROSUL 325 (65 Fe) MG tablet Take 325 mg by mouth daily.   fexofenadine (ALLEGRA) 180 MG tablet Take 180 mg by mouth daily.   fluticasone (FLONASE) 50 MCG/ACT nasal spray Place 1 spray into both nostrils daily.   furosemide (LASIX) 40 MG tablet Take 40 mg by mouth 2 (two) times daily.   Glucosamine HCl (GLUCOSAMINE PO) Take 2 tablets by mouth daily.   HYDROcodone-acetaminophen (NORCO/VICODIN) 5-325 MG tablet Take 1 tablet by mouth every 4 (four) hours as needed for moderate pain.   losartan (COZAAR) 50 MG tablet Take 100 mg by mouth daily.   Magnesium 400 MG CAPS Take 400 mg by mouth daily.   Melatonin 5 MG CAPS Take 10 mg by mouth at bedtime.   pantoprazole (PROTONIX) 40 MG tablet Take 40 mg by mouth 2 (two) times daily.   rosuvastatin (CRESTOR) 20 MG tablet Take 20 mg by mouth daily.   TURMERIC PO Take 2 tablets by mouth daily.   No current facility-administered medications for this visit. (Other)   REVIEW OF SYSTEMS: ROS   Positive for: Musculoskeletal, Cardiovascular, Eyes, Respiratory Negative for: Constitutional, Gastrointestinal, Neurological, Skin, Genitourinary, HENT, Endocrine, Psychiatric, Allergic/Imm, Heme/Lymph Last edited by Fredderick Severance,  Cherrie Distance, COT on 04/07/2022  8:46 AM.     ALLERGIES Allergies  Allergen Reactions   Codeine Itching   PAST MEDICAL HISTORY Past Medical History:  Diagnosis Date   Allergy    Arthritis    legs   Asthma    GERD (gastroesophageal reflux disease)    Hyperlipidemia    Hypertension    Past Surgical History:  Procedure Laterality Date   ABDOMINAL HYSTERECTOMY     CHOLECYSTECTOMY     COLONOSCOPY     GAS INSERTION Left 09/05/2021   Procedure: INSERTION OF GAS - C3F8;  Surgeon: [name], MD;  Location: [facility];  Service: Ophthalmology;  Laterality: Left;   GAS/FLUID EXCHANGE Left 09/05/2021   Procedure:  GAS/FLUID EXCHANGE;  Surgeon: [name], MD;  Location: [facility];  Service: Ophthalmology;  Laterality: Left;   PARS PLANA VITRECTOMY Left 09/05/2021   Procedure: PARS PLANA VITRECTOMY WITH 25 GAUGE;  Surgeon: [name], MD;  Location: [facility];  Service: Ophthalmology;  Laterality: Left;   PERFLUORONE INJECTION Left 09/05/2021   Procedure: PERFLUORON INJECTION;  Surgeon: [name], MD;  Location: [facility];  Service: Ophthalmology;  Laterality: Left;   PHOTOCOAGULATION WITH LASER Left 09/05/2021   Procedure: PHOTOCOAGULATION WITH LASER;  Surgeon: [name], MD;  Location: [facility];  Service: Ophthalmology;  Laterality: Left;   SCLERAL BUCKLE Left 09/05/2021   Procedure: SCLERAL BUCKLE;  Surgeon: [name], MD;  Location: [facility];  Service: Ophthalmology;  Laterality: Left;   UPPER GASTROINTESTINAL ENDOSCOPY     FAMILY HISTORY Family History  Problem Relation Age of Onset   Retinal detachment Sister    Diabetes Sister    Glaucoma Brother    Diabetes Brother    Colon cancer Brother        dx at 66   Esophageal cancer Neg Hx    Rectal cancer Neg Hx    Stomach cancer Neg Hx    SOCIAL HISTORY Social History   Tobacco Use   Smoking status: Former   Smokeless tobacco: Never   Tobacco comments:    quit smoking in 2002 or 2003  Vaping Use   Vaping Use: Never used  Substance Use Topics   Alcohol use: Yes    Comment: very rarely   Drug use: No       OPHTHALMIC EXAM:  Base Eye Exam     Visual Acuity (Snellen - Linear)       Right Left   Dist cc 20/25 20/200 +1   Dist ph cc  20/150 +1         Tonometry (Tonopen, 8:49 AM)       Right Left   Pressure 15 18         Pupils       Dark Light Shape React APD   Right 2 1 Round Brisk None   Left 3 2 Round Brisk None         Visual Fields       Left Right    Full Full         Extraocular Movement       Right Left    Full, Ortho Full, Ortho         Neuro/Psych     Oriented x3: Yes   Mood/Affect:  Normal         Dilation     Both eyes: 1.0% Mydriacyl, 2.5% Phenylephrine @ 8:46 AM           Slit Lamp  and Fundus Exam     External Exam       Right Left   External  Periorbital edema -- improved         Slit Lamp Exam       Right Left   Lids/Lashes Dermatochalasis - upper lid Dermatochalasis - upper lid, Meibomian gland dysfunction   Conjunctiva/Sclera white and quiet White and quiet   Cornea trace PEE 1-2+ fine Punctate epithelial erosions   Anterior Chamber deep and clear deep and clear   Iris round and dilated round and dilated   Lens 2+ NS, 2+ cortical PC IOL in good position   Anterior Vitreous syneresis, asteroid hyalosis, PVD post vitrectomy         Fundus Exam       Right Left   Disc Pink and Sharp mild Pallor, Sharp rim, Compact   C/D Ratio 0.3 0.5   Macula flat, good foveal reflex, no heme or edema Flat, Blunted foveal reflex, RPE mottling, ERM with striae greatest nasal mac, cystic changes centrally and nasally - slightly improved, No heme   Vessels attenuated, Tortuous attenuated, Tortuous   Periphery Attached, operculated hole @ 1030 with mild surrounding pigment -- good laser surrounding, no SRF, no new RT/RD Retina attached over buckle, good buckle height, good laser over buckle and around tears, PRE-OP: small tear with fibrosis @ 0200, bullous, temporal RD from 1230 to 0430           Refraction     Wearing Rx       Sphere Cylinder Axis Add   Right +1.00 +1.00 130 +2.75   Left +1.25 +0.75 017 +2.75           IMAGING AND PROCEDURES  Imaging and Procedures for 04/07/2022  OCT, Retina - OU - Both Eyes       Right Eye Quality was good. Central Foveal Thickness: 270. Progression has been stable. Findings include normal foveal contour, no IRF, no SRF.   Left Eye Quality was good. Central Foveal Thickness: 337. Progression has been stable. Findings include no SRF, abnormal foveal contour, epiretinal membrane, intraretinal fluid,  macular pucker (Retina stably re-attached, ERM with blunted foveal contour and mild pucker, persistent cystic changes nasal macula and fovea, focal pockets of SRF superior to disc -- slightly improved).   Notes *Images captured and stored on drive  Diagnosis / Impression:  OD: NFP; no IRF/SRF OS: Retina stably re-attached, ERM with blunted foveal contour and mild pucker, persistent cystic changes nasal macula and fovea, focal pockets of SRF superior to disc -- slightly improved  Clinical management:  See below  Abbreviations: NFP - Normal foveal profile. CME - cystoid macular edema. PED - pigment epithelial detachment. IRF - intraretinal fluid. SRF - subretinal fluid. EZ - ellipsoid zone. ERM - epiretinal membrane. ORA - outer retinal atrophy. ORT - outer retinal tubulation. SRHM - subretinal hyper-reflective material. IRHM - intraretinal hyper-reflective material            ASSESSMENT/PLAN:    ICD-10-CM   1. Left retinal detachment  H33.22 OCT, Retina - OU - Both Eyes    2. Cystoid macular edema of left eye  H35.352     3. Epiretinal membrane (ERM) of left eye  H35.372     4. Retinal tear of right eye  H33.311     5. Essential hypertension  I10     6. Hypertensive retinopathy of both eyes  H35.033     7. Combined forms of age-related cataract  of right eye  H25.811     8. Pseudophakia  Z96.1       1,2. Rhegmatogenous retinal detachment with retinal hole, left eye - bullous, temporal, mac off detachment, onset of foveal involvement Tuesday, 03.14.23 by history - detached from 1230 to 430 oclock, fovea off, small tear at 0200 - s/p SBP + PPV/PFO/EL/FAX/14% C3F8 OS, 03.23.2023 - retina attached and in good position -- good buckle height and laser around breaks  - BCVA OS 20/150 -- improved from 20/200 post-cataract sx  - OCT shows Retina stably re-attached, ERM with blunted foveal contour and mild pucker, mild interval improvement in cystic changes nasal macula and fovea,  focal pockets of SRF superior to disc -- slightly improved             - IOP improved to 15,18 OS             - continue Prolensa QID OS for CME  - on Omni drop taper OS per Dr. Lucianne Lei for s/p CEIOL OS  - continue dorzolamide TID OS per Dr. Lucianne Lei  - cont brimonidine BID OS  - f/u 4-6 weeks - POV OU, DFE OU per MD  3. Epiretinal membrane, left eye  - The natural history, anatomy, potential for loss of vision, and treatment options including vitrectomy techniques and the complications of endophthalmitis, retinal detachment, vitreous hemorrhage, cataract progression and permanent vision loss discussed with the patient. - mild ERM - OCT shows mild progression of ERM w/ persistent cystic changes - monitor for now, but discussed possibility of repeat PPV w/ membrane peel once stable from cataract surgery to remove ERM if vision fails to improve - f/u 4-6 mos -- DFE/OCT  4. Retinal hole OD  - operculated retinal hole located at 1030 with mild surrounding pigment, no SRF - s/p laser retinopexy OD (03.16.23) -- good laser surrounding - monitor  5,6. Hypertensive retinopathy OU - discussed importance of tight BP control - monitor  7. 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 - discussed diagnosis and progression - scheduled for CE/IOL OD 11.01.23 with Dr. Lucianne Lei  8. Pseudophakia OS  - s/p CE/IOL OS (Dr. Lucianne Lei, 10.04.23)  - IOL in good position, doing well  - monitor   Ophthalmic Meds Ordered this visit:  No orders of the defined types were placed in this encounter.    Return for f/u 4-6 weeks, RD OS, DFE, OCT.  There are no Patient Instructions on file for this visit.   Explained the diagnoses, plan, and follow up with the patient and they expressed understanding.  Patient expressed understanding of the importance of proper follow up care.   This document serves as a record of services personally  performed by Gardiner Sleeper, MD, PhD. It was created on their behalf by Roselee Nova, COMT. The creation of this record is the provider's dictation and/or activities during the visit.  Electronically signed by: Roselee Nova, COMT 04/07/22 11:34 AM  This document serves as a record of services personally performed by Gardiner Sleeper, MD, PhD. It was created on their behalf by San Jetty. Owens Shark, OA an ophthalmic technician. The creation of this record is the provider's dictation and/or activities during the visit.    Electronically signed by: San Jetty. Owens Shark, [place] 10.23.2023 11:34 AM  Gardiner Sleeper, M.D., Ph.D. Diseases & Surgery of the Retina and Vitreous [facility]  I have reviewed  the above documentation for accuracy and completeness, and I agree with the above. Gardiner Sleeper, M.D., Ph.D. 04/07/22 11:34 AM  Abbreviations: M myopia (nearsighted); A astigmatism; H hyperopia (farsighted); P presbyopia; Mrx spectacle prescription;  CTL contact lenses; OD right eye; OS left eye; OU both eyes  XT exotropia; ET esotropia; PEK punctate epithelial keratitis; PEE punctate epithelial erosions; DES dry eye syndrome; MGD meibomian gland dysfunction; ATs artificial tears; PFAT's preservative free artificial tears; [place] nuclear sclerotic cataract; PSC posterior subcapsular cataract; ERM epi-retinal membrane; PVD posterior vitreous detachment; RD retinal detachment; DM diabetes mellitus; DR diabetic retinopathy; NPDR non-proliferative diabetic retinopathy; PDR proliferative diabetic retinopathy; CSME clinically significant macular edema; DME diabetic macular edema; dbh dot blot hemorrhages; CWS cotton wool spot; POAG primary open angle glaucoma; C/D cup-to-disc ratio; HVF humphrey visual field; GVF goldmann visual field; OCT optical coherence tomography; IOP intraocular pressure; BRVO Branch retinal vein occlusion; CRVO central retinal vein occlusion; CRAO central retinal artery occlusion;  BRAO branch retinal artery occlusion; RT retinal tear; SB scleral buckle; PPV pars plana vitrectomy; VH Vitreous hemorrhage; PRP panretinal laser photocoagulation; IVK intravitreal kenalog; VMT vitreomacular traction; MH Macular hole;  NVD neovascularization of the disc; NVE neovascularization elsewhere; AREDS age related eye disease study; ARMD age related macular degeneration; POAG primary open angle glaucoma; EBMD epithelial/anterior basement membrane dystrophy; ACIOL anterior chamber intraocular lens; IOL intraocular lens; PCIOL posterior chamber intraocular lens; Phaco/IOL phacoemulsification with intraocular lens placement; [place] photorefractive keratectomy; LASIK laser assisted in situ keratomileusis; HTN hypertension; DM diabetes mellitus; COPD chronic obstructive pulmonary disease

## 2022-04-07 ENCOUNTER — Encounter (INDEPENDENT_AMBULATORY_CARE_PROVIDER_SITE_OTHER): Payer: Self-pay | Admitting: Ophthalmology

## 2022-04-07 ENCOUNTER — Ambulatory Visit (INDEPENDENT_AMBULATORY_CARE_PROVIDER_SITE_OTHER): Payer: Medicare HMO | Admitting: Ophthalmology

## 2022-04-07 DIAGNOSIS — H25811 Combined forms of age-related cataract, right eye: Secondary | ICD-10-CM

## 2022-04-07 DIAGNOSIS — H3322 Serous retinal detachment, left eye: Secondary | ICD-10-CM | POA: Diagnosis not present

## 2022-04-07 DIAGNOSIS — Z961 Presence of intraocular lens: Secondary | ICD-10-CM | POA: Diagnosis not present

## 2022-04-07 DIAGNOSIS — H35352 Cystoid macular degeneration, left eye: Secondary | ICD-10-CM

## 2022-04-07 DIAGNOSIS — H35033 Hypertensive retinopathy, bilateral: Secondary | ICD-10-CM

## 2022-04-07 DIAGNOSIS — H35372 Puckering of macula, left eye: Secondary | ICD-10-CM

## 2022-04-07 DIAGNOSIS — H33311 Horseshoe tear of retina without detachment, right eye: Secondary | ICD-10-CM

## 2022-04-07 DIAGNOSIS — I10 Essential (primary) hypertension: Secondary | ICD-10-CM | POA: Diagnosis not present

## 2022-04-07 DIAGNOSIS — H25813 Combined forms of age-related cataract, bilateral: Secondary | ICD-10-CM

## 2022-04-08 DIAGNOSIS — H25811 Combined forms of age-related cataract, right eye: Secondary | ICD-10-CM | POA: Diagnosis not present

## 2022-04-16 ENCOUNTER — Other Ambulatory Visit (INDEPENDENT_AMBULATORY_CARE_PROVIDER_SITE_OTHER): Payer: Self-pay

## 2022-04-16 DIAGNOSIS — H268 Other specified cataract: Secondary | ICD-10-CM | POA: Diagnosis not present

## 2022-04-16 DIAGNOSIS — H25811 Combined forms of age-related cataract, right eye: Secondary | ICD-10-CM | POA: Diagnosis not present

## 2022-04-16 MED ORDER — DORZOLAMIDE HCL-TIMOLOL MAL 2-0.5 % OP SOLN: 1.0000 [drp] | Freq: Three times a day (TID) | OPHTHALMIC | 2 refills | Status: DC

## 2022-04-27 ENCOUNTER — Other Ambulatory Visit (INDEPENDENT_AMBULATORY_CARE_PROVIDER_SITE_OTHER): Payer: Self-pay | Admitting: Ophthalmology

## 2022-04-30 ENCOUNTER — Other Ambulatory Visit (INDEPENDENT_AMBULATORY_CARE_PROVIDER_SITE_OTHER): Payer: Self-pay

## 2022-04-30 MED ORDER — PROLENSA 0.07 % OP SOLN: 1.0000 [drp] | Freq: Four times a day (QID) | OPHTHALMIC | 6 refills | Status: DC

## 2022-04-30 NOTE — Progress Notes (Signed)
[facility] Note  05/05/2022    CHIEF COMPLAINT Patient presents for Retina Follow Up   HISTORY OF PRESENT ILLNESS: Alicia Miranda is a 66 y.o. female who presents to the clinic today for:   HPI     Retina Follow Up   Patient presents with  Other.  In left eye.  This started months ago.  Duration of 4 weeks.  Since onset it is stable.  I, the attending physician,  performed the HPI with the patient and updated documentation appropriately.        Comments   Patient feels that she can see a little better in the left eye. She is using Rhopressa OS QD, Brimonidine OS TID, Dorz OS TID, Prolensa OS QID, Pred Forte OS QID, and Lastacraft OS QD.      Last edited by [name], MD on 05/05/2022 12:02 PM.     Referring physician: Antony Contras, MD [place],  [place] [address]  HISTORICAL INFORMATION:   Selected notes from the MEDICAL RECORD NUMBER Referred by Dr. Madilyn Hook for possible RD OS LEE: 03/16/02023 Ocular Hx-  PMH- HTN    CURRENT MEDICATIONS: Current Outpatient Medications (Ophthalmic Drugs)  Medication Sig   brimonidine (ALPHAGAN) 0.2 % ophthalmic solution Place 1 drop into the left eye 3 (three) times daily.   Bromfenac Sodium (PROLENSA) 0.07 % SOLN Place 1 drop into the left eye in the morning, at noon, in the evening, and at bedtime. PT NEEDS 2 BOTTLES DISPENSED AT A TIME-CALL IF COUPON IS NEEDED   dorzolamide-timolol (COSOPT) 2-0.5 % ophthalmic solution Place 1 drop into the left eye in the morning, at noon, and at bedtime.   LOTEPREDNOL ETABONATE OP Place 1 drop into the right eye 4 (four) times daily.   prednisoLONE acetate (PRED FORTE) 1 % ophthalmic suspension INSTILL 1 DROP INTO LEFT EYE 4 TIMES DAILY   No current facility-administered medications for this visit. (Ophthalmic Drugs)   Current Outpatient Medications (Other)  Medication Sig   APPLE CIDER VINEGAR PO Take 2 tablets by mouth daily.  gummies   Cholecalciferol (VITAMIN D3) 10 MCG (400 UNIT) tablet Take 400 Units by mouth daily.   CINNAMON PO Take 1,000 mg by mouth every other day.   COLLAGEN PO Take 3 capsules by mouth daily.   Denosumab (PROLIA [place]) Inject 60 mg into the skin every 6 (six) months.   FEROSUL 325 (65 Fe) MG tablet Take 325 mg by mouth daily.   fexofenadine (ALLEGRA) 180 MG tablet Take 180 mg by mouth daily.   fluticasone (FLONASE) 50 MCG/ACT nasal spray Place 1 spray into both nostrils daily.   furosemide (LASIX) 40 MG tablet Take 40 mg by mouth 2 (two) times daily.   Glucosamine HCl (GLUCOSAMINE PO) Take 2 tablets by mouth daily.   HYDROcodone-acetaminophen (NORCO/VICODIN) 5-325 MG tablet Take 1 tablet by mouth every 4 (four) hours as needed for moderate pain.   losartan (COZAAR) 50 MG tablet Take 100 mg by mouth daily.   Magnesium 400 MG CAPS Take 400 mg by mouth daily.   Melatonin 5 MG CAPS Take 10 mg by mouth at bedtime.   pantoprazole (PROTONIX) 40 MG tablet Take 40 mg by mouth 2 (two) times daily.   rosuvastatin (CRESTOR) 20 MG tablet Take 20 mg by mouth daily.   TURMERIC PO Take 2 tablets by mouth daily.   No current facility-administered medications for this visit. (Other)   REVIEW OF  SYSTEMS: ROS   Positive for: Musculoskeletal, Cardiovascular, Eyes, Respiratory Negative for: Constitutional, Gastrointestinal, Neurological, Skin, Genitourinary, HENT, Endocrine, Psychiatric, Allergic/Imm, Heme/Lymph Last edited by Annie Paras, COT on 05/05/2022  9:40 AM.     ALLERGIES Allergies  Allergen Reactions   Codeine Itching   PAST MEDICAL HISTORY Past Medical History:  Diagnosis Date   Allergy    Arthritis    legs   Asthma    GERD (gastroesophageal reflux disease)    Hyperlipidemia    Hypertension    Past Surgical History:  Procedure Laterality Date   ABDOMINAL HYSTERECTOMY     CHOLECYSTECTOMY     COLONOSCOPY     GAS INSERTION Left 09/05/2021   Procedure: INSERTION OF GAS -  C3F8;  Surgeon: [name], MD;  Location: [facility];  Service: Ophthalmology;  Laterality: Left;   GAS/FLUID EXCHANGE Left 09/05/2021   Procedure: GAS/FLUID EXCHANGE;  Surgeon: [name], MD;  Location: [facility];  Service: Ophthalmology;  Laterality: Left;   PARS PLANA VITRECTOMY Left 09/05/2021   Procedure: PARS PLANA VITRECTOMY WITH 25 GAUGE;  Surgeon: [name], MD;  Location: [facility];  Service: Ophthalmology;  Laterality: Left;   PERFLUORONE INJECTION Left 09/05/2021   Procedure: PERFLUORON INJECTION;  Surgeon: [name], MD;  Location: [facility];  Service: Ophthalmology;  Laterality: Left;   PHOTOCOAGULATION WITH LASER Left 09/05/2021   Procedure: PHOTOCOAGULATION WITH LASER;  Surgeon: [name], MD;  Location: [facility];  Service: Ophthalmology;  Laterality: Left;   SCLERAL BUCKLE Left 09/05/2021   Procedure: SCLERAL BUCKLE;  Surgeon: [name], MD;  Location: [facility];  Service: Ophthalmology;  Laterality: Left;   UPPER GASTROINTESTINAL ENDOSCOPY     FAMILY HISTORY Family History  Problem Relation Age of Onset   Retinal detachment Sister    Diabetes Sister    Glaucoma Brother    Diabetes Brother    Colon cancer Brother        dx at 19   Esophageal cancer Neg Hx    Rectal cancer Neg Hx    Stomach cancer Neg Hx    SOCIAL HISTORY Social History   Tobacco Use   Smoking status: Former   Smokeless tobacco: Never   Tobacco comments:    quit smoking in 2002 or 2003  Vaping Use   Vaping Use: Never used  Substance Use Topics   Alcohol use: Yes    Comment: very rarely   Drug use: No       OPHTHALMIC EXAM:  Base Eye Exam     Visual Acuity (Snellen - Linear)       Right Left   Dist [place] 20/25 20/100 +2   Dist ph [place]  NI         Tonometry (Tonopen, 9:43 AM)       Right Left   Pressure 17 19         Pupils       Dark Light Shape React APD   Right 2 1 Round Brisk None   Left 3 2 Round Brisk None         Visual Fields       Left Right    Full Full          Extraocular Movement       Right Left    Full, Ortho Full, Ortho         Neuro/Psych     Oriented x3: Yes   Mood/Affect: Normal         Dilation  Both eyes: 1.0% Mydriacyl, 2.5% Phenylephrine @ 9:40 AM           Slit Lamp and Fundus Exam     External Exam       Right Left   External  Periorbital edema -- improved         Slit Lamp Exam       Right Left   Lids/Lashes Dermatochalasis - upper lid Dermatochalasis - upper lid, Meibomian gland dysfunction   Conjunctiva/Sclera white and quiet White and quiet   Cornea trace PEE, mild tear film debris, well healed cataract wound 1-2+ fine Punctate epithelial erosions, well healed cataract wound   Anterior Chamber deep and clear deep and clear   Iris round and dilated round and dilated   Lens PC IOL in good position PC IOL in good position   Anterior Vitreous syneresis, asteroid hyalosis, PVD post vitrectomy         Fundus Exam       Right Left   Disc Pink and Sharp mild Pallor, Sharp rim, Compact   C/D Ratio 0.3 0.3   Macula flat, good foveal reflex, no heme or edema Flat, Blunted foveal reflex, RPE mottling, ERM with striae greatest nasal mac, cystic changes centrally and nasally - slightly increased, No heme   Vessels attenuated, Tortuous attenuated, Tortuous   Periphery Attached, operculated hole @ 1030 with mild surrounding pigment -- good laser surrounding, no SRF, no new RT/RD Retina attached over buckle, good buckle height, good laser over buckle and around tears, PRE-OP: small tear with fibrosis @ 0200, bullous, temporal RD from 1230 to 0430           Refraction     Wearing Rx       Sphere Cylinder Axis Add   Right +1.00 +1.00 130 +2.75   Left +1.25 +0.75 017 +2.75           IMAGING AND PROCEDURES  Imaging and Procedures for 05/05/2022  OCT, Retina - OU - Both Eyes       Right Eye Quality was good. Central Foveal Thickness: 266. Progression has been stable. Findings include  normal foveal contour, no IRF, no SRF.   Left Eye Quality was good. Central Foveal Thickness: 366. Progression has worsened. Findings include no SRF, abnormal foveal contour, epiretinal membrane, intraretinal fluid, macular pucker (Retina stably re-attached, ERM with blunted foveal contour and mild pucker, persistent cystic changes nasal macula and fovea -- slightly increased, focal pockets of SRF superior to disc -- persistent ).   Notes *Images captured and stored on drive  Diagnosis / Impression:  OD: NFP; no IRF/SRF WU:[id number] stably re-attached, ERM with blunted foveal contour and mild pucker, persistent cystic changes nasal macula and fovea -- slightly increased, focal pockets of SRF superior to disc -- persistent   Clinical management:  See below  Abbreviations: NFP - Normal foveal profile. CME - cystoid macular edema. PED - pigment epithelial detachment. IRF - intraretinal fluid. SRF - subretinal fluid. EZ - ellipsoid zone. ERM - epiretinal membrane. ORA - outer retinal atrophy. ORT - outer retinal tubulation. SRHM - subretinal hyper-reflective material. IRHM - intraretinal hyper-reflective material      Injection into Tenon's Capsule - OS - Left Eye       Time Out 05/05/2022. 11:06 AM. Confirmed correct patient, procedure, site, and patient consented.   Anesthesia Topical anesthesia was used. Anesthetic medications included Lidocaine 2%, Proparacaine 0.5%.   Procedure Preparation included 5% betadine to ocular surface, eyelid speculum. A (25g)  needle was used.   Injection: 40 mg triamcinolone acetonide 40 MG/ML   Route: Other, Site: Left Eye   Fate: [id number], Lot: [id number], Expiration date: 07/17/2023   Post-op Post injection exam found visual acuity of at least counting fingers. The patient tolerated the procedure well. There were no complications. The patient received written and verbal post procedure care education. Post injection medications included ocuflox.    Notes 0.9 cc of Kenalog-40 (36 mg) was injected into subtenon's capsule in the superotemporal quadrant. Betadine was applied to Injection area pre and post-injection then rinsed with sterile BSS. 1 drop of ofloxacin was instilled into the eye. There were no complications. Pt tolerated procedure well.           ASSESSMENT/PLAN:    ICD-10-CM   1. Left retinal detachment  H33.22 OCT, Retina - OU - Both Eyes    2. Cystoid macular edema of left eye  H35.352 OCT, Retina - OU - Both Eyes    Injection into Tenon's Capsule - OS - Left Eye    triamcinolone acetonide (KENALOG-40) injection 40 mg    CANCELED: Intravitreal Injection, Pharmacologic Agent - OS - Left Eye    3. Epiretinal membrane (ERM) of left eye  H35.372     4. Retinal tear of right eye  H33.311     5. Essential hypertension  I10     6. Hypertensive retinopathy of both eyes  H35.033     7. Pseudophakia of both eyes  Z96.1      1,2. Rhegmatogenous retinal detachment with retinal hole, left eye - bullous, temporal, mac off detachment, onset of foveal involvement Tuesday, 03.14.23 by history - detached from 1230 to 430 oclock, fovea off, small tear at 0200 - s/p SBP + PPV/PFO/EL/FAX/14% C3F8 OS, 03.23.2023 - retina attached and in good position -- good buckle height and laser around breaks  - BCVA OS 20/10 -- improved from 20/200 post-cataract sx  - OCT shows Retina stably re-attached, ERM with blunted foveal contour and mild pucker, persistent cystic changes nasal macula and fovea -- slightly increased, focal pockets of SRF superior to disc -- persistent   - recommend STK OS #1 today, 11.2023 for persistent CME  - pt wishes to proceed with injection  - RBA of procedure discussed, questions answered - STK informed consent obtained and signed, 11.20.23 (OS) - see procedure note             - IOP improved to 17,19 OS             - continue Prolensa QID OS for CME  - continue dorzolamide TID OS per Dr. Lucianne Lei  - cont  brimonidine BID OS  - f/u 4 weeks DFE OU per MD, OCT, FA (transit OS)  3. Epiretinal membrane, left eye  - mild ERM - OCT shows persistent ERM w/ cystic changes slightly increased - monitor for now, but discussed possibility of repeat PPV w/ membrane peel once stable from cataract surgery to remove ERM if vision fails to improve  4. Retinal hole OD  - operculated retinal hole located at 1030 with mild surrounding pigment, no SRF - s/p laser retinopexy OD (03.16.23) -- good laser surrounding - monitor  5,6. Hypertensive retinopathy OU - discussed importance of tight BP control - monitor  7. Pseudophakia OU  - s/p CE/IOL OS (Dr. Lucianne Lei, 10.04.23), CE with IOL OD (11.01.23, Dr. Lucianne Lei)  - IOL in good position, doing well  - monitor   Ophthalmic Meds Ordered this visit:  Meds ordered this encounter  Medications   triamcinolone acetonide (KENALOG-40) injection 40 mg     Return in about 4 weeks (around 06/02/2022) for f/u RD OS, DFE, OCT.  There are no Patient Instructions on file for this visit.   Explained the diagnoses, plan, and follow up with the patient and they expressed understanding.  Patient expressed understanding of the importance of proper follow up care.   This document serves as a record of services personally performed by Gardiner Sleeper, MD, PhD. It was created on their behalf by Roselee Nova, COMT. The creation of this record is the provider's dictation and/or activities during the visit.  Electronically signed by: Roselee Nova, COMT 05/05/22 12:06 PM  This document serves as a record of services personally performed by Gardiner Sleeper, MD, PhD. It was created on their behalf by San Jetty. Owens Shark, OA an ophthalmic technician. The creation of this record is the provider's dictation and/or activities during the visit.    Electronically signed by: San Jetty. Owens Shark, [place] 11.20.2023 12:06 PM  Gardiner Sleeper, M.D., Ph.D. Diseases & Surgery of the Retina and Vitreous [facility]  I have reviewed the above documentation for accuracy and completeness, and I agree with the above. Gardiner Sleeper, M.D., Ph.D. 05/05/22 12:06 PM   Abbreviations: M myopia (nearsighted); A astigmatism; H hyperopia (farsighted); P presbyopia; Mrx spectacle prescription;  CTL contact lenses; OD right eye; OS left eye; OU both eyes  XT exotropia; ET esotropia; PEK punctate epithelial keratitis; PEE punctate epithelial erosions; DES dry eye syndrome; MGD meibomian gland dysfunction; ATs artificial tears; PFAT's preservative free artificial tears; [place] nuclear sclerotic cataract; PSC posterior subcapsular cataract; ERM epi-retinal membrane; PVD posterior vitreous detachment; RD retinal detachment; DM diabetes mellitus; DR diabetic retinopathy; NPDR non-proliferative diabetic retinopathy; PDR proliferative diabetic retinopathy; CSME clinically significant macular edema; DME diabetic macular edema; dbh dot blot hemorrhages; CWS cotton wool spot; POAG primary open angle glaucoma; C/D cup-to-disc ratio; HVF humphrey visual field; GVF goldmann visual field; OCT optical coherence tomography; IOP intraocular pressure; BRVO Branch retinal vein occlusion; CRVO central retinal vein occlusion; CRAO central retinal artery occlusion; BRAO branch retinal artery occlusion; RT retinal tear; SB scleral buckle; PPV pars plana vitrectomy; VH Vitreous hemorrhage; PRP panretinal laser photocoagulation; IVK intravitreal kenalog; VMT vitreomacular traction; MH Macular hole;  NVD neovascularization of the disc; NVE neovascularization elsewhere; AREDS age related eye disease study; ARMD age related macular degeneration; POAG primary open angle glaucoma; EBMD epithelial/anterior basement membrane dystrophy; ACIOL anterior chamber intraocular lens; IOL intraocular lens; PCIOL posterior chamber intraocular lens; Phaco/IOL phacoemulsification with intraocular lens placement; [place] photorefractive keratectomy; LASIK  laser assisted in situ keratomileusis; HTN hypertension; DM diabetes mellitus; COPD chronic obstructive pulmonary disease

## 2022-05-05 ENCOUNTER — Ambulatory Visit (INDEPENDENT_AMBULATORY_CARE_PROVIDER_SITE_OTHER): Payer: Medicare HMO | Admitting: Ophthalmology

## 2022-05-05 ENCOUNTER — Encounter (INDEPENDENT_AMBULATORY_CARE_PROVIDER_SITE_OTHER): Payer: Self-pay | Admitting: Ophthalmology

## 2022-05-05 DIAGNOSIS — I10 Essential (primary) hypertension: Secondary | ICD-10-CM | POA: Diagnosis not present

## 2022-05-05 DIAGNOSIS — H35352 Cystoid macular degeneration, left eye: Secondary | ICD-10-CM

## 2022-05-05 DIAGNOSIS — H33311 Horseshoe tear of retina without detachment, right eye: Secondary | ICD-10-CM | POA: Diagnosis not present

## 2022-05-05 DIAGNOSIS — H3322 Serous retinal detachment, left eye: Secondary | ICD-10-CM | POA: Diagnosis not present

## 2022-05-05 DIAGNOSIS — Z961 Presence of intraocular lens: Secondary | ICD-10-CM

## 2022-05-05 DIAGNOSIS — H35372 Puckering of macula, left eye: Secondary | ICD-10-CM | POA: Diagnosis not present

## 2022-05-05 DIAGNOSIS — H35033 Hypertensive retinopathy, bilateral: Secondary | ICD-10-CM | POA: Diagnosis not present

## 2022-05-05 MED ORDER — TRIAMCINOLONE ACETONIDE 40 MG/ML IJ SUSP FOR KALEIDOSCOPE
40.0000 mg | INTRAMUSCULAR | Status: AC | PRN
  Administered 2022-05-05: 40 mg

## 2022-05-07 ENCOUNTER — Other Ambulatory Visit (INDEPENDENT_AMBULATORY_CARE_PROVIDER_SITE_OTHER): Payer: Self-pay | Admitting: Ophthalmology

## 2022-05-26 ENCOUNTER — Other Ambulatory Visit (INDEPENDENT_AMBULATORY_CARE_PROVIDER_SITE_OTHER): Payer: Self-pay

## 2022-05-26 MED ORDER — DORZOLAMIDE HCL-TIMOLOL MAL 2-0.5 % OP SOLN: 1.0000 [drp] | Freq: Three times a day (TID) | OPHTHALMIC | 2 refills | Status: DC

## 2022-05-29 NOTE — Progress Notes (Signed)
[facility] Note  06/02/2022    CHIEF COMPLAINT Patient presents for Retina Follow Up   HISTORY OF PRESENT ILLNESS: Alicia Miranda is a 66 y.o. female who presents to the clinic today for:   HPI     Retina Follow Up   Patient presents with  Other.  In left eye.  This started 4 weeks ago.  Duration of 4 weeks.  Since onset it is stable.  I, the attending physician,  performed the HPI with the patient and updated documentation appropriately.        Comments   4 week retnia follow up RD OS pt states her vision in her left eye is still little blurred it has improved some she denies any flashes or floaters       Last edited by [name], MD on 06/03/2022  8:33 AM.     Referring physician: Antony Contras, MD [address] [place],  [place] [id number]  HISTORICAL INFORMATION:   Selected notes from the MEDICAL RECORD NUMBER Referred by Dr. Madilyn Hook for possible RD OS LEE: 03/16/02023 Ocular Hx-  PMH- HTN    CURRENT MEDICATIONS: Current Outpatient Medications (Ophthalmic Drugs)  Medication Sig   dorzolamide (TRUSOPT) 2 % ophthalmic solution Place 1 drop into the left eye 3 (three) times daily.   prednisoLONE acetate (PRED FORTE) 1 % ophthalmic suspension Place 1 drop into the left eye 4 (four) times daily.   brimonidine (ALPHAGAN) 0.2 % ophthalmic solution INSTILL 1 DROP INTO LEFT EYE THREE TIMES DAILY   Bromfenac Sodium (PROLENSA) 0.07 % SOLN Place 1 drop into the left eye in the morning, at noon, in the evening, and at bedtime. PT NEEDS 2 BOTTLES DISPENSED AT A TIME-CALL IF COUPON IS NEEDED   dorzolamide-timolol (COSOPT) 2-0.5 % ophthalmic solution Place 1 drop into the left eye in the morning, at noon, and at bedtime.   LOTEPREDNOL ETABONATE OP Place 1 drop into the right eye 4 (four) times daily.   prednisoLONE acetate (PRED FORTE) 1 % ophthalmic suspension INSTILL 1 DROP INTO LEFT EYE 4 TIMES DAILY   No current  facility-administered medications for this visit. (Ophthalmic Drugs)   Current Outpatient Medications (Other)  Medication Sig   APPLE CIDER VINEGAR PO Take 2 tablets by mouth daily. gummies   Cholecalciferol (VITAMIN D3) 10 MCG (400 UNIT) tablet Take 400 Units by mouth daily.   CINNAMON PO Take 1,000 mg by mouth every other day.   COLLAGEN PO Take 3 capsules by mouth daily.   Denosumab (PROLIA [place]) Inject 60 mg into the skin every 6 (six) months.   FEROSUL 325 (65 Fe) MG tablet Take 325 mg by mouth daily.   fexofenadine (ALLEGRA) 180 MG tablet Take 180 mg by mouth daily.   fluticasone (FLONASE) 50 MCG/ACT nasal spray Place 1 spray into both nostrils daily.   furosemide (LASIX) 40 MG tablet Take 40 mg by mouth 2 (two) times daily.   Glucosamine HCl (GLUCOSAMINE PO) Take 2 tablets by mouth daily.   HYDROcodone-acetaminophen (NORCO/VICODIN) 5-325 MG tablet Take 1 tablet by mouth every 4 (four) hours as needed for moderate pain.   losartan (COZAAR) 50 MG tablet Take 100 mg by mouth daily.   Magnesium 400 MG CAPS Take 400 mg by mouth daily.   Melatonin 5 MG CAPS Take 10 mg by mouth at bedtime.   pantoprazole (PROTONIX) 40 MG tablet Take 40 mg by mouth 2 (two) times daily.   rosuvastatin (  CRESTOR) 20 MG tablet Take 20 mg by mouth daily.   TURMERIC PO Take 2 tablets by mouth daily.   No current facility-administered medications for this visit. (Other)   REVIEW OF SYSTEMS: ROS   Positive for: Eyes Negative for: Constitutional, Gastrointestinal, Neurological, Skin, Genitourinary, Musculoskeletal, HENT, Endocrine, Cardiovascular, Respiratory, Psychiatric, Allergic/Imm, Heme/Lymph Last edited by [name], MD on 06/03/2022  8:33 AM.     ALLERGIES Allergies  Allergen Reactions   Codeine Itching   PAST MEDICAL HISTORY Past Medical History:  Diagnosis Date   Allergy    Arthritis    legs   Asthma    GERD (gastroesophageal reflux disease)    Hyperlipidemia    Hypertension    Past  Surgical History:  Procedure Laterality Date   ABDOMINAL HYSTERECTOMY     CHOLECYSTECTOMY     COLONOSCOPY     GAS INSERTION Left 09/05/2021   Procedure: INSERTION OF GAS - C3F8;  Surgeon: [name], MD;  Location: [place];  Service: Ophthalmology;  Laterality: Left;   GAS/FLUID EXCHANGE Left 09/05/2021   Procedure: GAS/FLUID EXCHANGE;  Surgeon: [name], MD;  Location: [place];  Service: Ophthalmology;  Laterality: Left;   PARS PLANA VITRECTOMY Left 09/05/2021   Procedure: PARS PLANA VITRECTOMY WITH 25 GAUGE;  Surgeon: [name], MD;  Location: [place];  Service: Ophthalmology;  Laterality: Left;   PERFLUORONE INJECTION Left 09/05/2021   Procedure: PERFLUORON INJECTION;  Surgeon: [name], MD;  Location: [place];  Service: Ophthalmology;  Laterality: Left;   PHOTOCOAGULATION WITH LASER Left 09/05/2021   Procedure: PHOTOCOAGULATION WITH LASER;  Surgeon: [name], MD;  Location: [place];  Service: Ophthalmology;  Laterality: Left;   SCLERAL BUCKLE Left 09/05/2021   Procedure: SCLERAL BUCKLE;  Surgeon: [name], MD;  Location: [place];  Service: Ophthalmology;  Laterality: Left;   UPPER GASTROINTESTINAL ENDOSCOPY     FAMILY HISTORY Family History  Problem Relation Age of Onset   Retinal detachment Sister    Diabetes Sister    Glaucoma Brother    Diabetes Brother    Colon cancer Brother        dx at 83   Esophageal cancer Neg Hx    Rectal cancer Neg Hx    Stomach cancer Neg Hx    SOCIAL HISTORY Social History   Tobacco Use   Smoking status: Former   Smokeless tobacco: Never   Tobacco comments:    quit smoking in 2002 or 2003  Vaping Use   Vaping Use: Never used  Substance Use Topics   Alcohol use: Yes    Comment: very rarely   Drug use: No       OPHTHALMIC EXAM:  Base Eye Exam     Visual Acuity (Snellen - Linear)       Right Left   Dist [place] 20/25 20/100 -2   Dist ph [place]  NI         Tonometry (Tonopen, 9:23 AM)       Right Left   Pressure 14 15          Pupils       Pupils Dark Light Shape React APD   Right PERRL 3 2 Round Brisk None   Left PERRL 3 2 Round Brisk None         Visual Fields       Left Right    Full Full         Extraocular Movement       Right Left  Full, Ortho Full, Ortho         Neuro/Psych     Oriented x3: Yes   Mood/Affect: Normal         Dilation     Both eyes: 2.5% Phenylephrine @ 9:23 AM           Slit Lamp and Fundus Exam     External Exam       Right Left   External  Periorbital edema -- improved         Slit Lamp Exam       Right Left   Lids/Lashes Dermatochalasis - upper lid Dermatochalasis - upper lid, Meibomian gland dysfunction   Conjunctiva/Sclera white and quiet White and quiet; STK ST quad   Cornea trace PEE, mild tear film debris, well healed cataract wound 1-2+ fine Punctate epithelial erosions, well healed cataract wound   Anterior Chamber deep and clear deep and clear   Iris round and dilated round and dilated   Lens PC IOL in good position PC IOL in good position   Anterior Vitreous syneresis, asteroid hyalosis, PVD post vitrectomy         Fundus Exam       Right Left   Disc Pink and Sharp mild Pallor, Sharp rim, Compact   C/D Ratio 0.3 0.3   Macula flat, good foveal reflex, no heme or edema Flat, Blunted foveal reflex, RPE mottling, ERM with striae greatest nasal mac, cystic changes centrally and nasally - slightly improved, No heme   Vessels attenuated, Tortuous attenuated, Tortuous   Periphery Attached, operculated hole @ 1030 with mild surrounding pigment -- good laser surrounding, no SRF, no new RT/RD Trace pockets of SRF superior to disc with fibrosis, Retina attached over buckle, good buckle height, good laser over buckle and around tears, PRE-OP: small tear with fibrosis @ 0200, bullous, temporal RD from 1230 to 0430           Refraction     Wearing Rx       Sphere Cylinder Axis Add   Right +1.00 +1.00 130 +2.75   Left +1.25  +0.75 017 +2.75           IMAGING AND PROCEDURES  Imaging and Procedures for 06/02/2022  OCT, Retina - OU - Both Eyes       Right Eye Quality was good. Central Foveal Thickness: 270. Progression has been stable. Findings include normal foveal contour, no IRF, no SRF.   Left Eye Quality was good. Central Foveal Thickness: 369. Progression has been stable. Findings include no SRF, abnormal foveal contour, epiretinal membrane, intraretinal fluid, macular pucker (Retina stably re-attached, ERM with blunted foveal contour and mild pucker, persistent cystic changes nasal macula and fovea -- slightly improved -focal pockets of SRF superior to disc -- persistent ).   Notes *Images captured and stored on drive  Diagnosis / Impression:  OD: NFP; no IRF/SRF PO:EUMPNT stably re-attached, ERM with blunted foveal contour and mild pucker, persistent cystic changes nasal macula and fovea -- slightly improved - focal pockets of SRF superior to disc -- persistent   Clinical management:  See below  Abbreviations: NFP - Normal foveal profile. CME - cystoid macular edema. PED - pigment epithelial detachment. IRF - intraretinal fluid. SRF - subretinal fluid. EZ - ellipsoid zone. ERM - epiretinal membrane. ORA - outer retinal atrophy. ORT - outer retinal tubulation. SRHM - subretinal hyper-reflective material. IRHM - intraretinal hyper-reflective material      Fluorescein Angiography Optos (Transit OS)  Right Eye Progression has no prior data. Early phase findings include staining. Mid/Late phase findings include staining (Focal staining ST periphery corresponding to laser retinopexy).   Left Eye Progression has no prior data. Early phase findings include staining. Mid/Late phase findings include staining (Mild Hyperfluorescence of disc, mild perifoveal staining SN to fovea--?petaloid).   Notes **Images stored on drive**  Impression: OD: Focal staining ST periphery corresponding to laser  retinopexy OS: Mild Hyperfluorescence of disc, mild perifoveal staining SN to fovea--?petaloid           ASSESSMENT/PLAN:    ICD-10-CM   1. Left retinal detachment  H33.22 OCT, Retina - OU - Both Eyes    2. Cystoid macular edema of left eye  H35.352 Fluorescein Angiography Optos (Transit OS)    3. Epiretinal membrane (ERM) of left eye  H35.372     4. Retinal tear of right eye  H33.311     5. Essential hypertension  I10     6. Hypertensive retinopathy of both eyes  H35.033     7. Pseudophakia of both eyes  Z96.1      1,2. Rhegmatogenous retinal detachment with retinal hole, left eye  - s/p STK #1 (11.20.23) for CME - bullous, temporal, mac off detachment, onset of foveal involvement Tuesday, 03.14.23 by history - detached from 1230 to 430 oclock, fovea off, small tear at 0200 - s/p SBP + PPV/PFO/EL/FAX/14% C3F8 OS, 03.23.2023 - retina attached and in good position -- good buckle height and laser around breaks  - BCVA OS 20/100 -- stable - OCT shows Retina stably re-attached, ERM with blunted foveal contour and mild pucker, persistent cystic changes nasal macula and fovea -- slightly improved, - focal pockets of SRF superior to disc -- persistent  - FA 12.18.23 shows OD Focal staining ST periphery corresponding to laser retinopexy, OS Mild Hyperfluorescence of disc, mild perifoveal staining SN to fovea--?petaloid -- suggestive of CME - STK informed consent obtained and signed, 11.20.23 (OS)             - IOP improved to 15             - continue Prolensa and Pred QID OS for CME  - continue Dorzolamide TID OS, Brimonidine TID OS - per Dr. Lucianne Lei  - f/u 4 weeks DFE, OCT  3. Epiretinal membrane, left eye  - mild ERM - OCT shows persistent ERM w/ cystic changes slightly increased - monitor for now, but discussed possibility of repeat PPV w/ membrane peel to remove ERM if vision fails to improve  4. Retinal hole OD  - operculated retinal hole located at 1030 with mild surrounding  pigment, no SRF - s/p laser retinopexy OD (03.16.23) -- good laser surrounding - continue to monitor  5,6. Hypertensive retinopathy OU - discussed importance of tight BP control - continue to monitor  7. Pseudophakia OU  - s/p CE/IOL OS (Dr. Lucianne Lei, 10.04.23), CE with IOL OD (11.01.23, Dr. Lucianne Lei)  - IOL in good position, doing well  - continue to monitor  Ophthalmic Meds Ordered this visit:  Meds ordered this encounter  Medications   brimonidine (ALPHAGAN) 0.2 % ophthalmic solution    Sig: INSTILL 1 DROP INTO LEFT EYE THREE TIMES DAILY    Dispense:  10 mL    Refill:  4   Bromfenac Sodium (PROLENSA) 0.07 % SOLN    Sig: Place 1 drop into the left eye in the morning, at noon, in the evening, and at bedtime. PT NEEDS 2 BOTTLES  DISPENSED AT A TIME-CALL IF COUPON IS NEEDED    Dispense:  6 mL    Refill:  3   prednisoLONE acetate (PRED FORTE) 1 % ophthalmic suspension    Sig: Place 1 drop into the left eye 4 (four) times daily.    Dispense:  10 mL    Refill:  0   dorzolamide (TRUSOPT) 2 % ophthalmic solution    Sig: Place 1 drop into the left eye 3 (three) times daily.    Dispense:  10 mL    Refill:  3     Return in about 4 weeks (around 06/30/2022) for f/u RD w/ Retinal Hole OS, DFE, OCT.  There are no Patient Instructions on file for this visit.  Explained the diagnoses, plan, and follow up with the patient and they expressed understanding.  Patient expressed understanding of the importance of proper follow up care.   This document serves as a record of services personally performed by Gardiner Sleeper, MD, PhD. It was created on their behalf by Roselee Nova, COMT. The creation of this record is the provider's dictation and/or activities during the visit.  Electronically signed by: Roselee Nova, COMT 06/03/22 8:33 AM  This document serves as a record of services personally performed by Gardiner Sleeper, MD, PhD. It was created on their behalf by San Jetty. Owens Shark, OA an ophthalmic  technician. The creation of this record is the provider's dictation and/or activities during the visit.    Electronically signed by: San Jetty. Owens Shark, [place] 12.18.2023 8:33 AM  This document serves as a record of services personally performed by Gardiner Sleeper, MD, PhD. It was created on their behalf by Renaldo Reel, [place] an ophthalmic technician. The creation of this record is the provider's dictation and/or activities during the visit.    Electronically signed by:  Renaldo Reel, COT  12.18.23 8:33 AM  Gardiner Sleeper, M.D., Ph.D. Diseases & Surgery of the Retina and Vitreous [facility]  I have reviewed the above documentation for accuracy and completeness, and I agree with the above. Gardiner Sleeper, M.D., Ph.D. 06/03/22 8:36 AM  Abbreviations: M myopia (nearsighted); A astigmatism; H hyperopia (farsighted); P presbyopia; Mrx spectacle prescription;  CTL contact lenses; OD right eye; OS left eye; OU both eyes  XT exotropia; ET esotropia; PEK punctate epithelial keratitis; PEE punctate epithelial erosions; DES dry eye syndrome; MGD meibomian gland dysfunction; ATs artificial tears; PFAT's preservative free artificial tears; Golden nuclear sclerotic cataract; PSC posterior subcapsular cataract; ERM epi-retinal membrane; PVD posterior vitreous detachment; RD retinal detachment; DM diabetes mellitus; DR diabetic retinopathy; NPDR non-proliferative diabetic retinopathy; PDR proliferative diabetic retinopathy; CSME clinically significant macular edema; DME diabetic macular edema; dbh dot blot hemorrhages; CWS cotton wool spot; POAG primary open angle glaucoma; C/D cup-to-disc ratio; HVF humphrey visual field; GVF goldmann visual field; OCT optical coherence tomography; IOP intraocular pressure; BRVO Branch retinal vein occlusion; CRVO central retinal vein occlusion; CRAO central retinal artery occlusion; BRAO branch retinal artery occlusion; RT retinal tear; SB scleral buckle;  PPV pars plana vitrectomy; VH Vitreous hemorrhage; PRP panretinal laser photocoagulation; IVK intravitreal kenalog; VMT vitreomacular traction; MH Macular hole;  NVD neovascularization of the disc; NVE neovascularization elsewhere; AREDS age related eye disease study; ARMD age related macular degeneration; POAG primary open angle glaucoma; EBMD epithelial/anterior basement membrane dystrophy; ACIOL anterior chamber intraocular lens; IOL intraocular lens; PCIOL posterior chamber intraocular lens; Phaco/IOL phacoemulsification with intraocular lens placement; PRK photorefractive keratectomy; LASIK laser assisted in situ  keratomileusis; HTN hypertension; DM diabetes mellitus; COPD chronic obstructive pulmonary disease

## 2022-06-02 ENCOUNTER — Ambulatory Visit (INDEPENDENT_AMBULATORY_CARE_PROVIDER_SITE_OTHER): Payer: Medicare HMO | Admitting: Ophthalmology

## 2022-06-02 DIAGNOSIS — H35033 Hypertensive retinopathy, bilateral: Secondary | ICD-10-CM

## 2022-06-02 DIAGNOSIS — H35372 Puckering of macula, left eye: Secondary | ICD-10-CM | POA: Diagnosis not present

## 2022-06-02 DIAGNOSIS — H3322 Serous retinal detachment, left eye: Secondary | ICD-10-CM

## 2022-06-02 DIAGNOSIS — I10 Essential (primary) hypertension: Secondary | ICD-10-CM

## 2022-06-02 DIAGNOSIS — H35352 Cystoid macular degeneration, left eye: Secondary | ICD-10-CM

## 2022-06-02 DIAGNOSIS — Z961 Presence of intraocular lens: Secondary | ICD-10-CM

## 2022-06-02 DIAGNOSIS — H33311 Horseshoe tear of retina without detachment, right eye: Secondary | ICD-10-CM

## 2022-06-02 MED ORDER — BRIMONIDINE TARTRATE 0.2 % OP SOLN: OPHTHALMIC | 4 refills | Status: DC

## 2022-06-02 MED ORDER — PROLENSA 0.07 % OP SOLN: 1.0000 [drp] | Freq: Four times a day (QID) | OPHTHALMIC | 3 refills | Status: DC

## 2022-06-02 MED ORDER — PREDNISOLONE ACETATE 1 % OP SUSP: 1.0000 [drp] | Freq: Four times a day (QID) | OPHTHALMIC | 0 refills | Status: DC

## 2022-06-02 MED ORDER — DORZOLAMIDE HCL 2 % OP SOLN: 1.0000 [drp] | Freq: Three times a day (TID) | OPHTHALMIC | 3 refills | Status: AC

## 2022-06-03 ENCOUNTER — Encounter (INDEPENDENT_AMBULATORY_CARE_PROVIDER_SITE_OTHER): Payer: Self-pay | Admitting: Ophthalmology

## 2022-06-11 ENCOUNTER — Other Ambulatory Visit (INDEPENDENT_AMBULATORY_CARE_PROVIDER_SITE_OTHER): Payer: Self-pay | Admitting: Ophthalmology

## 2022-07-08 ENCOUNTER — Other Ambulatory Visit (HOSPITAL_COMMUNITY): Payer: Self-pay | Admitting: Family Medicine

## 2022-07-08 DIAGNOSIS — Z1231 Encounter for screening mammogram for malignant neoplasm of breast: Secondary | ICD-10-CM

## 2022-07-08 NOTE — Progress Notes (Signed)
[facility] Note  07/14/2022    CHIEF COMPLAINT Patient presents for Retina Follow Up   HISTORY OF PRESENT ILLNESS: Alicia Miranda is a 67 y.o. female who presents to the clinic today for:   HPI     Retina Follow Up   Patient presents with  Other.  In left eye.  This started 6 weeks ago.  I, the attending physician,  performed the HPI with the patient and updated documentation appropriately.        Comments   Patient here for 4 weeks (6 weeks ) for retina follow up for CME OS. Patient states vision is doing good in OD. Os is still blurry. A work in progress. No eye pain.still using all for drops.       Last edited by [name], MD on 07/14/2022  8:18 PM.      Referring physician: Antony Contras, MD [address] [place],  [place] [id number]  HISTORICAL INFORMATION:   Selected notes from the MEDICAL RECORD NUMBER Referred by Dr. Madilyn Hook for possible RD OS LEE: 03/16/02023 Ocular Hx-  PMH- HTN    CURRENT MEDICATIONS: Current Outpatient Medications (Ophthalmic Drugs)  Medication Sig   brimonidine (ALPHAGAN) 0.2 % ophthalmic solution INSTILL 1 DROP INTO LEFT EYE THREE TIMES DAILY   Bromfenac Sodium (PROLENSA) 0.07 % SOLN Place 1 drop into the left eye in the morning, at noon, in the evening, and at bedtime. PT NEEDS 2 BOTTLES DISPENSED AT A TIME-CALL IF COUPON IS NEEDED   dorzolamide (TRUSOPT) 2 % ophthalmic solution Place 1 drop into the left eye 3 (three) times daily.   dorzolamide-timolol (COSOPT) 2-0.5 % ophthalmic solution Place 1 drop into the left eye in the morning, at noon, and at bedtime.   LOTEPREDNOL ETABONATE OP Place 1 drop into the right eye 4 (four) times daily.   prednisoLONE acetate (PRED FORTE) 1 % ophthalmic suspension INSTILL 1 DROP INTO LEFT EYE 4 TIMES DAILY   prednisoLONE acetate (PRED FORTE) 1 % ophthalmic suspension Place 1 drop into the left eye 4 (four) times daily.   No current  facility-administered medications for this visit. (Ophthalmic Drugs)   Current Outpatient Medications (Other)  Medication Sig   APPLE CIDER VINEGAR PO Take 2 tablets by mouth daily. gummies   Cholecalciferol (VITAMIN D3) 10 MCG (400 UNIT) tablet Take 400 Units by mouth daily.   CINNAMON PO Take 1,000 mg by mouth every other day.   COLLAGEN PO Take 3 capsules by mouth daily.   Denosumab (PROLIA [place]) Inject 60 mg into the skin every 6 (six) months.   FEROSUL 325 (65 Fe) MG tablet Take 325 mg by mouth daily.   fexofenadine (ALLEGRA) 180 MG tablet Take 180 mg by mouth daily.   fluticasone (FLONASE) 50 MCG/ACT nasal spray Place 1 spray into both nostrils daily.   furosemide (LASIX) 40 MG tablet Take 40 mg by mouth 2 (two) times daily.   Glucosamine HCl (GLUCOSAMINE PO) Take 2 tablets by mouth daily.   HYDROcodone-acetaminophen (NORCO/VICODIN) 5-325 MG tablet Take 1 tablet by mouth every 4 (four) hours as needed for moderate pain.   losartan (COZAAR) 50 MG tablet Take 100 mg by mouth daily.   Magnesium 400 MG CAPS Take 400 mg by mouth daily.   Melatonin 5 MG CAPS Take 10 mg by mouth at bedtime.   pantoprazole (PROTONIX) 40 MG tablet Take 40 mg by mouth 2 (two) times daily.   rosuvastatin (CRESTOR)  20 MG tablet Take 20 mg by mouth daily.   TURMERIC PO Take 2 tablets by mouth daily.   No current facility-administered medications for this visit. (Other)   REVIEW OF SYSTEMS: ROS   Positive for: Musculoskeletal, Cardiovascular, Eyes, Respiratory Negative for: Constitutional, Gastrointestinal, Neurological, Skin, Genitourinary, HENT, Endocrine, Psychiatric, Allergic/Imm, Heme/Lymph Last edited by Theodore Demark, COA on 07/14/2022  9:25 AM.     ALLERGIES Allergies  Allergen Reactions   Codeine Itching   PAST MEDICAL HISTORY Past Medical History:  Diagnosis Date   Allergy    Arthritis    legs   Asthma    GERD (gastroesophageal reflux disease)    Hyperlipidemia    Hypertension     Past Surgical History:  Procedure Laterality Date   ABDOMINAL HYSTERECTOMY     CHOLECYSTECTOMY     COLONOSCOPY     GAS INSERTION Left 09/05/2021   Procedure: INSERTION OF GAS - C3F8;  Surgeon: [name], MD;  Location: [facility];  Service: Ophthalmology;  Laterality: Left;   GAS/FLUID EXCHANGE Left 09/05/2021   Procedure: GAS/FLUID EXCHANGE;  Surgeon: [name], MD;  Location: [facility];  Service: Ophthalmology;  Laterality: Left;   PARS PLANA VITRECTOMY Left 09/05/2021   Procedure: PARS PLANA VITRECTOMY WITH 25 GAUGE;  Surgeon: [name], MD;  Location: [facility];  Service: Ophthalmology;  Laterality: Left;   PERFLUORONE INJECTION Left 09/05/2021   Procedure: PERFLUORON INJECTION;  Surgeon: [name], MD;  Location: [facility];  Service: Ophthalmology;  Laterality: Left;   PHOTOCOAGULATION WITH LASER Left 09/05/2021   Procedure: PHOTOCOAGULATION WITH LASER;  Surgeon: [name], MD;  Location: [facility];  Service: Ophthalmology;  Laterality: Left;   SCLERAL BUCKLE Left 09/05/2021   Procedure: SCLERAL BUCKLE;  Surgeon: [name], MD;  Location: [facility];  Service: Ophthalmology;  Laterality: Left;   UPPER GASTROINTESTINAL ENDOSCOPY     FAMILY HISTORY Family History  Problem Relation Age of Onset   Retinal detachment Sister    Diabetes Sister    Glaucoma Brother    Diabetes Brother    Colon cancer Brother        dx at 75   Esophageal cancer Neg Hx    Rectal cancer Neg Hx    Stomach cancer Neg Hx    SOCIAL HISTORY Social History   Tobacco Use   Smoking status: Former   Smokeless tobacco: Never   Tobacco comments:    quit smoking in 2002 or 2003  Vaping Use   Vaping Use: Never used  Substance Use Topics   Alcohol use: Yes    Comment: very rarely   Drug use: No       OPHTHALMIC EXAM:  Base Eye Exam     Visual Acuity (Snellen - Linear)       Right Left   Dist [place] 20/20 20/150 -2   Dist ph [place]  NI         Tonometry (Tonopen, 9:16 AM)       Right Left   Pressure  15 33, 34         Tonometry #2 (Tonopen, 10:19 AM)       Right Left   Pressure 14 33         Tonometry Comments   Brimonidine and Cosopt given OS @ 9:21am        Pupils       Dark Light Shape React APD   Right 3 2 Round Brisk None   Left 3 2 Round Brisk  None         Visual Fields (Counting fingers)       Left Right    Full Full         Extraocular Movement       Right Left    Full, Ortho Full, Ortho         Neuro/Psych     Oriented x3: Yes   Mood/Affect: Normal         Dilation     Both eyes: 1.0% Mydriacyl, 2.5% Phenylephrine @ 9:16 AM           Slit Lamp and Fundus Exam     External Exam       Right Left   External  Periorbital edema -- improved         Slit Lamp Exam       Right Left   Lids/Lashes Dermatochalasis - upper lid Dermatochalasis - upper lid, Meibomian gland dysfunction   Conjunctiva/Sclera white and quiet White and quiet; STK ST quad   Cornea trace PEE, mild tear film debris, well healed cataract wound 3+Punctate epithelial erosions, well healed cataract wound   Anterior Chamber deep and clear deep and clear   Iris round and dilated round and dilated   Lens PC IOL in good position PC IOL in good position   Anterior Vitreous syneresis, asteroid hyalosis, PVD post vitrectomy         Fundus Exam       Right Left   Disc Pink and Sharp mild Pallor, Sharp rim, Compact   C/D Ratio 0.3 0.3   Macula flat, good foveal reflex, no heme or edema Flat, Blunted foveal reflex, RPE mottling, ERM with striae greatest nasal mac, cystic changes centrally and nasally - slightly increased, No heme   Vessels attenuated, Tortuous attenuated, Tortuous   Periphery Attached, operculated hole @ 1030 with mild surrounding pigment -- good laser surrounding, no SRF, no new RT/RD Trace pockets of SRF superior to disc with fibrosis, Retina attached over buckle, good buckle height, good laser over buckle and around tears, PRE-OP: small tear  with fibrosis @ 0200, bullous, temporal RD from 1230 to 0430           Refraction     Wearing Rx       Sphere Cylinder Axis Add   Right +1.00 +1.00 130 +2.75   Left +1.25 +0.75 017 +2.75           IMAGING AND PROCEDURES  Imaging and Procedures for 07/14/2022  OCT, Retina - OU - Both Eyes       Right Eye Quality was good. Central Foveal Thickness: 277. Progression has been stable. Findings include normal foveal contour, no IRF, no SRF.   Left Eye Quality was good. Central Foveal Thickness: 383. Progression has worsened. Findings include no SRF, abnormal foveal contour, epiretinal membrane, intraretinal fluid, macular pucker (Retina stably re-attached, ERM with blunted foveal contour and mild pucker, mild interval increase in cystic changes nasal macula and fovea, focal pockets of SRF superior to disc -- persistent ).   Notes *Images captured and stored on drive  Diagnosis / Impression:  OD: NFP; no IRF/SRF [id number] stably re-attached, ERM with blunted foveal contour and mild pucker, mild interval increase in cystic changes nasal macula and fovea, focal pockets of SRF superior to disc -- persistent   Clinical management:  See below  Abbreviations: NFP - Normal foveal profile. CME - cystoid macular edema. PED - pigment epithelial detachment. IRF - intraretinal  fluid. SRF - subretinal fluid. EZ - ellipsoid zone. ERM - epiretinal membrane. ORA - outer retinal atrophy. ORT - outer retinal tubulation. SRHM - subretinal hyper-reflective material. IRHM - intraretinal hyper-reflective material            ASSESSMENT/PLAN:    ICD-10-CM   1. Left retinal detachment  H33.22     2. Cystoid macular edema of left eye  H35.352 OCT, Retina - OU - Both Eyes    3. Epiretinal membrane (ERM) of left eye  H35.372     4. Retinal tear of right eye  H33.311     5. Essential hypertension  I10     6. Hypertensive retinopathy of both eyes  H35.033     7. Pseudophakia of both eyes   Z96.1      1,2. Rhegmatogenous retinal detachment with retinal hole, left eye  - s/p STK #1 (11.20.23) for CME - bullous, temporal, mac off detachment, onset of foveal involvement Tuesday, 03.14.23 by history - detached from 1230 to 430 oclock, fovea off, small tear at 0200 - s/p SBP + PPV/PFO/EL/FAX/14% C3F8 OS, 03.23.2023 - retina attached and in good position -- good buckle height and laser around breaks  - BCVA OS 20/150 -- decreased - OCT shows UT:MLYYTK stably re-attached, ERM with blunted foveal contour and mild pucker, mild interval increase in cystic changes nasal macula and fovea, focal pockets of SRF superior to disc -- persistent  - FA 12.18.23 shows OD Focal staining ST periphery corresponding to laser retinopexy, OS Mild Hyperfluorescence of disc, mild perifoveal staining SN to fovea--?petaloid -- suggestive of CME - STK informed consent obtained and signed, 11.20.23 (OS)             - IOP OS 33,34 today -- pt did use drops this morning             - continue Prolensa QID OS for CME  - dec PredForte to BID OS  - continue Brimonidine TID OS  - will switch dorzolamide to Cosopt TID OS and refer to Dr. Nancy Fetter for ocular hypertension consult  - f/u 3-4 weeks DFE, OCT  3. Epiretinal membrane, left eye  - mild ERM - OCT shows persistent ERM w/ cystic changes slightly increased - monitor for now, but discussed possibility of repeat PPV w/ membrane peel to remove ERM if vision fails to improve  4. Retinal hole OD  - operculated retinal hole located at 1030 with mild surrounding pigment, no SRF - s/p laser retinopexy OD (03.16.23) -- good laser surrounding - continue to monitor  5,6. Hypertensive retinopathy OU - discussed importance of tight BP control - continue to monitor  7. Pseudophakia OU  - s/p CE/IOL OS (Dr. Lucianne Lei, 10.04.23), CE with IOL OD (11.01.23, Dr. Lucianne Lei)  - IOL in good position, doing well  - continue to monitor  Ophthalmic Meds Ordered this visit:  No orders of  the defined types were placed in this encounter.    Return in about 3 weeks (around 08/04/2022) for f/u CME OS, DFE, OCT.  There are no Patient Instructions on file for this visit.  Explained the diagnoses, plan, and follow up with the patient and they expressed understanding.  Patient expressed understanding of the importance of proper follow up care.   This document serves as a record of services personally performed by Gardiner Sleeper, MD, PhD. It was created on their behalf by San Jetty. Owens Shark, OA an ophthalmic technician. The creation of this record is the provider's  dictation and/or activities during the visit.    Electronically signed by: San Jetty. Owens Shark, [place] 01.23.2024 8:20 PM  Gardiner Sleeper, M.D., Ph.D. Diseases & Surgery of the Retina and Vitreous [facility]  I have reviewed the above documentation for accuracy and completeness, and I agree with the above. Gardiner Sleeper, M.D., Ph.D. 07/14/22 8:30 PM   Abbreviations: M myopia (nearsighted); A astigmatism; H hyperopia (farsighted); P presbyopia; Mrx spectacle prescription;  CTL contact lenses; OD right eye; OS left eye; OU both eyes  XT exotropia; ET esotropia; PEK punctate epithelial keratitis; PEE punctate epithelial erosions; DES dry eye syndrome; MGD meibomian gland dysfunction; ATs artificial tears; PFAT's preservative free artificial tears; [place] nuclear sclerotic cataract; PSC posterior subcapsular cataract; ERM epi-retinal membrane; PVD posterior vitreous detachment; RD retinal detachment; DM diabetes mellitus; DR diabetic retinopathy; NPDR non-proliferative diabetic retinopathy; PDR proliferative diabetic retinopathy; CSME clinically significant macular edema; DME diabetic macular edema; dbh dot blot hemorrhages; CWS cotton wool spot; POAG primary open angle glaucoma; C/D cup-to-disc ratio; HVF humphrey visual field; GVF goldmann visual field; OCT optical coherence tomography; IOP intraocular pressure;  BRVO Branch retinal vein occlusion; CRVO central retinal vein occlusion; CRAO central retinal artery occlusion; BRAO branch retinal artery occlusion; RT retinal tear; SB scleral buckle; PPV pars plana vitrectomy; VH Vitreous hemorrhage; PRP panretinal laser photocoagulation; IVK intravitreal kenalog; VMT vitreomacular traction; MH Macular hole;  NVD neovascularization of the disc; NVE neovascularization elsewhere; AREDS age related eye disease study; ARMD age related macular degeneration; POAG primary open angle glaucoma; EBMD epithelial/anterior basement membrane dystrophy; ACIOL anterior chamber intraocular lens; IOL intraocular lens; PCIOL posterior chamber intraocular lens; Phaco/IOL phacoemulsification with intraocular lens placement; [place] photorefractive keratectomy; LASIK laser assisted in situ keratomileusis; HTN hypertension; DM diabetes mellitus; COPD chronic obstructive pulmonary disease

## 2022-07-14 ENCOUNTER — Encounter (INDEPENDENT_AMBULATORY_CARE_PROVIDER_SITE_OTHER): Payer: Self-pay | Admitting: Ophthalmology

## 2022-07-14 ENCOUNTER — Ambulatory Visit (INDEPENDENT_AMBULATORY_CARE_PROVIDER_SITE_OTHER): Payer: Medicare HMO | Admitting: Ophthalmology

## 2022-07-14 DIAGNOSIS — H35033 Hypertensive retinopathy, bilateral: Secondary | ICD-10-CM

## 2022-07-14 DIAGNOSIS — H35373 Puckering of macula, bilateral: Secondary | ICD-10-CM | POA: Diagnosis not present

## 2022-07-14 DIAGNOSIS — Z961 Presence of intraocular lens: Secondary | ICD-10-CM | POA: Diagnosis not present

## 2022-07-14 DIAGNOSIS — H35372 Puckering of macula, left eye: Secondary | ICD-10-CM | POA: Diagnosis not present

## 2022-07-14 DIAGNOSIS — H3322 Serous retinal detachment, left eye: Secondary | ICD-10-CM | POA: Diagnosis not present

## 2022-07-14 DIAGNOSIS — H35352 Cystoid macular degeneration, left eye: Secondary | ICD-10-CM | POA: Diagnosis not present

## 2022-07-14 DIAGNOSIS — H40042 Steroid responder, left eye: Secondary | ICD-10-CM | POA: Diagnosis not present

## 2022-07-14 DIAGNOSIS — H33311 Horseshoe tear of retina without detachment, right eye: Secondary | ICD-10-CM | POA: Diagnosis not present

## 2022-07-14 DIAGNOSIS — I10 Essential (primary) hypertension: Secondary | ICD-10-CM

## 2022-07-14 DIAGNOSIS — H40013 Open angle with borderline findings, low risk, bilateral: Secondary | ICD-10-CM | POA: Diagnosis not present

## 2022-07-14 DIAGNOSIS — H40052 Ocular hypertension, left eye: Secondary | ICD-10-CM | POA: Diagnosis not present

## 2022-07-21 DIAGNOSIS — H40052 Ocular hypertension, left eye: Secondary | ICD-10-CM | POA: Diagnosis not present

## 2022-07-21 DIAGNOSIS — H40042 Steroid responder, left eye: Secondary | ICD-10-CM | POA: Diagnosis not present

## 2022-07-21 DIAGNOSIS — H35373 Puckering of macula, bilateral: Secondary | ICD-10-CM | POA: Diagnosis not present

## 2022-07-21 DIAGNOSIS — H40012 Open angle with borderline findings, low risk, left eye: Secondary | ICD-10-CM | POA: Diagnosis not present

## 2022-07-21 DIAGNOSIS — H40013 Open angle with borderline findings, low risk, bilateral: Secondary | ICD-10-CM | POA: Diagnosis not present

## 2022-07-25 DIAGNOSIS — R070 Pain in throat: Secondary | ICD-10-CM | POA: Diagnosis not present

## 2022-07-25 DIAGNOSIS — R5383 Other fatigue: Secondary | ICD-10-CM | POA: Diagnosis not present

## 2022-07-25 DIAGNOSIS — U071 COVID-19: Secondary | ICD-10-CM | POA: Diagnosis not present

## 2022-07-25 DIAGNOSIS — R0602 Shortness of breath: Secondary | ICD-10-CM | POA: Diagnosis not present

## 2022-07-25 DIAGNOSIS — R059 Cough, unspecified: Secondary | ICD-10-CM | POA: Diagnosis not present

## 2022-07-28 DIAGNOSIS — R0981 Nasal congestion: Secondary | ICD-10-CM | POA: Diagnosis not present

## 2022-07-28 DIAGNOSIS — J029 Acute pharyngitis, unspecified: Secondary | ICD-10-CM | POA: Diagnosis not present

## 2022-07-28 DIAGNOSIS — R051 Acute cough: Secondary | ICD-10-CM | POA: Diagnosis not present

## 2022-07-28 DIAGNOSIS — J069 Acute upper respiratory infection, unspecified: Secondary | ICD-10-CM | POA: Diagnosis not present

## 2022-07-28 DIAGNOSIS — U071 COVID-19: Secondary | ICD-10-CM | POA: Diagnosis not present

## 2022-08-04 ENCOUNTER — Encounter (INDEPENDENT_AMBULATORY_CARE_PROVIDER_SITE_OTHER): Payer: Medicare HMO | Admitting: Ophthalmology

## 2022-08-11 DIAGNOSIS — H40052 Ocular hypertension, left eye: Secondary | ICD-10-CM | POA: Diagnosis not present

## 2022-08-11 DIAGNOSIS — H40013 Open angle with borderline findings, low risk, bilateral: Secondary | ICD-10-CM | POA: Diagnosis not present

## 2022-08-11 DIAGNOSIS — H40042 Steroid responder, left eye: Secondary | ICD-10-CM | POA: Diagnosis not present

## 2022-08-11 DIAGNOSIS — H35373 Puckering of macula, bilateral: Secondary | ICD-10-CM | POA: Diagnosis not present

## 2022-08-11 NOTE — Progress Notes (Signed)
[facility] Note  08/13/2022    CHIEF COMPLAINT Patient presents for Retina Follow Up   HISTORY OF PRESENT ILLNESS: Alicia Miranda is a 67 y.o. female who presents to the clinic today for:   HPI     Retina Follow Up   Patient presents with  Other.  In left eye.  This started months ago.  Severity is moderate.  Duration of 4 weeks.  Since onset it is gradually improving.  I, the attending physician,  performed the HPI with the patient and updated documentation appropriately.        Comments   Pt here for 4 wk ret f/u for CME OS. Pt states VA may have improved a bit in OS-had the laser done by Dr. Nancy Fetter in [facility] for pressure since she was here last. Not due back to see Dr. Nancy Fetter for a year. Pt reports she was taken off of the pink and white cap gtts per Dr. Nancy Fetter. She's on the purple, blue and grey top gtts still.       Last edited by [name], MD on 08/13/2022 11:16 AM.    Pt has SLT laser on 02.05.24 with Dr. Nancy Fetter, she does not have to see her again for a year, pt is using brimonidine and Cosopt TID OU and Prolensa QID OU, Dr. Nancy Fetter took her off PF  Referring physician: Antony Contras, MD [place],  [place] [id number]  HISTORICAL INFORMATION:   Selected notes from the MEDICAL RECORD NUMBER Referred by Dr. Madilyn Hook for possible RD OS LEE: 03/16/02023 Ocular Hx-  PMH- HTN    CURRENT MEDICATIONS: Current Outpatient Medications (Ophthalmic Drugs)  Medication Sig   brimonidine (ALPHAGAN) 0.2 % ophthalmic solution INSTILL 1 DROP INTO LEFT EYE THREE TIMES DAILY   Bromfenac Sodium (PROLENSA) 0.07 % SOLN Place 1 drop into the left eye in the morning, at noon, in the evening, and at bedtime. PT NEEDS 2 BOTTLES DISPENSED AT A TIME-CALL IF COUPON IS NEEDED   dorzolamide (TRUSOPT) 2 % ophthalmic solution Place 1 drop into the left eye 3 (three) times daily.   dorzolamide-timolol (COSOPT) 2-0.5 % ophthalmic solution Place 1 drop into the  left eye in the morning, at noon, and at bedtime.   LOTEPREDNOL ETABONATE OP Place 1 drop into the right eye 4 (four) times daily.   prednisoLONE acetate (PRED FORTE) 1 % ophthalmic suspension INSTILL 1 DROP INTO LEFT EYE 4 TIMES DAILY (Patient not taking: Reported on 08/13/2022)   prednisoLONE acetate (PRED FORTE) 1 % ophthalmic suspension Place 1 drop into the left eye 4 (four) times daily. (Patient not taking: Reported on 08/13/2022)   No current facility-administered medications for this visit. (Ophthalmic Drugs)   Current Outpatient Medications (Other)  Medication Sig   APPLE CIDER VINEGAR PO Take 2 tablets by mouth daily. gummies   Cholecalciferol (VITAMIN D3) 10 MCG (400 UNIT) tablet Take 400 Units by mouth daily.   CINNAMON PO Take 1,000 mg by mouth every other day.   COLLAGEN PO Take 3 capsules by mouth daily.   Denosumab (PROLIA [place]) Inject 60 mg into the skin every 6 (six) months.   FEROSUL 325 (65 Fe) MG tablet Take 325 mg by mouth daily.   fexofenadine (ALLEGRA) 180 MG tablet Take 180 mg by mouth daily.   fluticasone (FLONASE) 50 MCG/ACT nasal spray Place 1 spray into both nostrils daily.   furosemide (LASIX) 40 MG tablet Take 40 mg by  mouth 2 (two) times daily.   Glucosamine HCl (GLUCOSAMINE PO) Take 2 tablets by mouth daily.   HYDROcodone-acetaminophen (NORCO/VICODIN) 5-325 MG tablet Take 1 tablet by mouth every 4 (four) hours as needed for moderate pain.   losartan (COZAAR) 50 MG tablet Take 100 mg by mouth daily.   Magnesium 400 MG CAPS Take 400 mg by mouth daily.   Melatonin 5 MG CAPS Take 10 mg by mouth at bedtime.   pantoprazole (PROTONIX) 40 MG tablet Take 40 mg by mouth 2 (two) times daily.   rosuvastatin (CRESTOR) 20 MG tablet Take 20 mg by mouth daily.   TURMERIC PO Take 2 tablets by mouth daily.   No current facility-administered medications for this visit. (Other)   REVIEW OF SYSTEMS: ROS   Positive for: Musculoskeletal, Cardiovascular, Eyes,  Respiratory Negative for: Constitutional, Gastrointestinal, Neurological, Skin, Genitourinary, HENT, Endocrine, Psychiatric, Allergic/Imm, Heme/Lymph Last edited by Kingsley Spittle, COT on 08/13/2022  8:44 AM.     ALLERGIES Allergies  Allergen Reactions   Codeine Itching   PAST MEDICAL HISTORY Past Medical History:  Diagnosis Date   Allergy    Arthritis    legs   Asthma    GERD (gastroesophageal reflux disease)    Hyperlipidemia    Hypertension    Past Surgical History:  Procedure Laterality Date   ABDOMINAL HYSTERECTOMY     CHOLECYSTECTOMY     COLONOSCOPY     GAS INSERTION Left 09/05/2021   Procedure: INSERTION OF GAS - C3F8;  Surgeon: [name], MD;  Location: [place];  Service: Ophthalmology;  Laterality: Left;   GAS/FLUID EXCHANGE Left 09/05/2021   Procedure: GAS/FLUID EXCHANGE;  Surgeon: [name], MD;  Location: [place];  Service: Ophthalmology;  Laterality: Left;   PARS PLANA VITRECTOMY Left 09/05/2021   Procedure: PARS PLANA VITRECTOMY WITH 25 GAUGE;  Surgeon: [name], MD;  Location: [place];  Service: Ophthalmology;  Laterality: Left;   PERFLUORONE INJECTION Left 09/05/2021   Procedure: PERFLUORON INJECTION;  Surgeon: [name], MD;  Location: [place];  Service: Ophthalmology;  Laterality: Left;   PHOTOCOAGULATION WITH LASER Left 09/05/2021   Procedure: PHOTOCOAGULATION WITH LASER;  Surgeon: [name], MD;  Location: [place];  Service: Ophthalmology;  Laterality: Left;   SCLERAL BUCKLE Left 09/05/2021   Procedure: SCLERAL BUCKLE;  Surgeon: [name], MD;  Location: [place];  Service: Ophthalmology;  Laterality: Left;   UPPER GASTROINTESTINAL ENDOSCOPY     FAMILY HISTORY Family History  Problem Relation Age of Onset   Retinal detachment Sister    Diabetes Sister    Glaucoma Brother    Diabetes Brother    Colon cancer Brother        dx at 31   Esophageal cancer Neg Hx    Rectal cancer Neg Hx    Stomach cancer Neg Hx    SOCIAL HISTORY Social  History   Tobacco Use   Smoking status: Former   Smokeless tobacco: Never   Tobacco comments:    quit smoking in 2002 or 2003  Vaping Use   Vaping Use: Never used  Substance Use Topics   Alcohol use: Yes    Comment: very rarely   Drug use: No       OPHTHALMIC EXAM:  Base Eye Exam     Visual Acuity (Snellen - Linear)       Right Left   Dist [place]ain 20/20 -2 20/150 -1   Dist ph   NI  Tonometry (Tonopen, 8:49 AM)       Right Left   Pressure 10 16         Pupils       Pupils Dark Light Shape React APD   Right PERRL 3 2 Round Brisk None   Left PERRL 3 2 Round Brisk None         Visual Fields (Counting fingers)       Left Right    Full Full         Extraocular Movement       Right Left    Full, Ortho Full, Ortho         Neuro/Psych     Oriented x3: Yes   Mood/Affect: Normal         Dilation     Both eyes: 1.0% Mydriacyl, 2.5% Phenylephrine @ 8:50 AM           Slit Lamp and Fundus Exam     External Exam       Right Left   External  Periorbital edema -- improved         Slit Lamp Exam       Right Left   Lids/Lashes Dermatochalasis - upper lid Dermatochalasis - upper lid   Conjunctiva/Sclera white and quiet White and quiet; mild residual STK ST quad   Cornea trace PEE, mild tear film debris, well healed cataract wound Trace Punctate epithelial erosions, well healed cataract wound   Anterior Chamber deep and clear deep and clear   Iris round and dilated round and dilated   Lens PC IOL in good position PC IOL in good position   Anterior Vitreous syneresis, asteroid hyalosis, PVD post vitrectomy         Fundus Exam       Right Left   Disc Pink and Sharp mild Pallor, Sharp rim, Compact   C/D Ratio 0.4 0.3   Macula flat, good foveal reflex, mild RPE mottling, No heme or edema Flat, Blunted foveal reflex, RPE mottling, ERM with striae greatest nasal mac, cystic changes centrally and nasally - persistent, No heme    Vessels attenuated, Tortuous attenuated, Tortuous   Periphery Attached, operculated hole @ 1030 with mild surrounding pigment -- good laser surrounding, no SRF, no new RT/RD Trace pockets of SRF superior to disc with fibrosis, Retina attached over buckle, good buckle height, good laser over buckle and around tears, PRE-OP: small tear with fibrosis @ 0200, bullous, temporal RD from 1230 to 0430           IMAGING AND PROCEDURES  Imaging and Procedures for 08/13/2022  OCT, Retina - OU - Both Eyes       Right Eye Quality was good. Central Foveal Thickness: 274. Progression has been stable. Findings include normal foveal contour, no IRF, no SRF.   Left Eye Quality was good. Central Foveal Thickness: 480. Progression has worsened. Findings include no SRF, abnormal foveal contour, epiretinal membrane, intraretinal fluid, macular pucker (Retina stably re-attached, ERM with blunted foveal contour and mild pucker, mild interval increase in cystic changes nasal macula and fovea, focal pockets of SRF superior to disc -- persistent ).   Notes *Images captured and stored on drive  Diagnosis / Impression:  OD: NFP; no IRF/SRF [id number] stably re-attached, ERM with blunted foveal contour and mild pucker, mild interval increase in cystic changes nasal macula and fovea, focal pockets of SRF superior to disc -- persistent   Clinical management:  See below  Abbreviations: NFP -  Normal foveal profile. CME - cystoid macular edema. PED - pigment epithelial detachment. IRF - intraretinal fluid. SRF - subretinal fluid. EZ - ellipsoid zone. ERM - epiretinal membrane. ORA - outer retinal atrophy. ORT - outer retinal tubulation. SRHM - subretinal hyper-reflective material. IRHM - intraretinal hyper-reflective material      Injection into Tenon's Capsule - OS - Left Eye       Time Out 08/13/2022. 9:23 AM. Confirmed correct patient, procedure, site, and patient consented.   Anesthesia Topical anesthesia  was used. Anesthetic medications included Lidocaine 2%, Proparacaine 0.5%.   Procedure Preparation included 5% betadine to ocular surface, eyelid speculum. A (25g) needle was used.   Injection: 40 mg triamcinolone acetonide 40 MG/ML   Route: Other, Site: Left Eye   [place]: [id number], LotVC:[id number], Expiration date: 02/14/2024   Post-op Post injection exam found visual acuity of at least counting fingers. The patient tolerated the procedure well. There were no complications. The patient received written and verbal post procedure care education. Post injection medications included ocuflox.   Notes 0.9 cc of Kenalog-40 (36 mg) was injected into subtenon's capsule in the superotemporal quadrant. Betadine was applied to Injection area pre and post-injection then rinsed with sterile BSS. 1 drop of ofloxacin was instilled into the eye. There were no complications. Pt tolerated procedure well.           ASSESSMENT/PLAN:    ICD-10-CM   1. Left retinal detachment  H33.22     2. Cystoid macular edema of left eye  H35.352 OCT, Retina - OU - Both Eyes    Injection into Tenon's Capsule - OS - Left Eye    triamcinolone acetonide (KENALOG-40) injection 40 mg    3. Epiretinal membrane (ERM) of left eye  H35.372     4. Retinal tear of right eye  H33.311     5. Essential hypertension  I10     6. Hypertensive retinopathy of both eyes  H35.033     7. 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 Rhegmatogenous retinal detachment with retinal hole, left eye  - s/p STK #1 (11.20.23) for CME - bullous, temporal, mac off detachment, onset of foveal involvement Tuesday, 03.14.23 by history - detached from 1230 to 430 oclock, fovea off, small tear at 0200 - s/p SBP + PPV/PFO/EL/FAX/14% C3F8 OS, 03.23.2023 - retina attached and in good position -- good buckle height and laser around breaks  - BCVA OS 20/150 -- decreased - OCT shows [id number] stably  re-attached, ERM with blunted foveal contour and mild pucker, mild interval increase in cystic changes nasal macula and fovea, focal pockets of SRF superior to disc -- persistent  - FA 12.18.23 shows OD Focal staining ST periphery corresponding to laser retinopexy, OS Mild Hyperfluorescence of disc, mild perifoveal staining SN to fovea--?petaloid -- suggestive of CME - recommend STK OS #2 today, 02.28.24 - pt wishes to proceed with injection - RBA of procedure discussed, questions answered - see procedure note - STK informed consent obtained and signed, 11.20.23 (OS)             - IOP OS 10,16 today              - continue Prolensa QID OS for CME  - dec PredForte to BID OS -- pt no longer using per Dr. Nancy Fetter  - continue Brimonidine TID OS  - cont Cosopt TID OS   -  f/u 6-8 weeks DFE, OCT  3. Epiretinal membrane, left eye  - mild ERM - OCT shows persistent ERM w/ cystic changes slightly increased - monitor for now, but discussed possibility of repeat PPV w/ membrane peel to remove ERM if vision fails to improve  4. Retinal hole OD  - operculated retinal hole located at 1030 with mild surrounding pigment, no SRF - s/p laser retinopexy OD (03.16.23) -- good laser surrounding - continue to monitor  5,6. Hypertensive retinopathy OU - discussed importance of tight BP control - continue to monitor  7. Pseudophakia OU  - s/p CE/IOL OS (Dr. Lucianne Lei, 10.04.23), CE with IOL OD (11.01.23, Dr. Lucianne Lei)  - IOL in good position, doing well  - continue to monitor  8. Ocular hypertension OS  - likely steroid response  - tmax 33 on 01.29.24  - s/p SLT OS w/ Dr. Nancy Fetter on 12.05.23  - IOP 16 OS  - cont brim and cosopt TID OS  Ophthalmic Meds Ordered this visit:  Meds ordered this encounter  Medications   triamcinolone acetonide (KENALOG-40) injection 40 mg     Return for f/u 6-8 weeks, CME OS, DFE, OCT.  There are no Patient Instructions on file for this visit.  Explained the diagnoses, plan, and  follow up with the patient and they expressed understanding.  Patient expressed understanding of the importance of proper follow up care.   This document serves as a record of services personally performed by Gardiner Sleeper, MD, PhD. It was created on their behalf by Orvan Falconer, an ophthalmic technician. The creation of this record is the provider's dictation and/or activities during the visit.    Electronically signed by: Orvan Falconer, OA, 08/13/22  11:25 AM  This document serves as a record of services personally performed by Gardiner Sleeper, MD, PhD. It was created on their behalf by San Jetty. Owens Shark, OA an ophthalmic technician. The creation of this record is the provider's dictation and/or activities during the visit.    Electronically signed by: San Jetty. Owens Shark, OA @TODAY$ @ 11:25 AM  Gardiner Sleeper, M.D., Ph.D. Diseases & Surgery of the Retina and Vitreous [facility]  I have reviewed the above documentation for accuracy and completeness, and I agree with the above. Gardiner Sleeper, M.D., Ph.D. 08/13/22 11:25 AM   Abbreviations: M myopia (nearsighted); A astigmatism; H hyperopia (farsighted); P presbyopia; Mrx spectacle prescription;  CTL contact lenses; OD right eye; OS left eye; OU both eyes  XT exotropia; ET esotropia; PEK punctate epithelial keratitis; PEE punctate epithelial erosions; DES dry eye syndrome; MGD meibomian gland dysfunction; ATs artificial tears; PFAT's preservative free artificial tears; [place] nuclear sclerotic cataract; PSC posterior subcapsular cataract; ERM epi-retinal membrane; PVD posterior vitreous detachment; RD retinal detachment; DM diabetes mellitus; DR diabetic retinopathy; NPDR non-proliferative diabetic retinopathy; PDR proliferative diabetic retinopathy; CSME clinically significant macular edema; DME diabetic macular edema; dbh dot blot hemorrhages; CWS cotton wool spot; POAG primary open angle glaucoma; C/D cup-to-disc ratio;  HVF humphrey visual field; GVF goldmann visual field; OCT optical coherence tomography; IOP intraocular pressure; BRVO Branch retinal vein occlusion; CRVO central retinal vein occlusion; CRAO central retinal artery occlusion; BRAO branch retinal artery occlusion; RT retinal tear; SB scleral buckle; PPV pars plana vitrectomy; VH Vitreous hemorrhage; PRP panretinal laser photocoagulation; IVK intravitreal kenalog; VMT vitreomacular traction; MH Macular hole;  NVD neovascularization of the disc; NVE neovascularization elsewhere; AREDS age related eye disease study; ARMD age related macular degeneration; POAG primary open angle  glaucoma; EBMD epithelial/anterior basement membrane dystrophy; ACIOL anterior chamber intraocular lens; IOL intraocular lens; PCIOL posterior chamber intraocular lens; Phaco/IOL phacoemulsification with intraocular lens placement; [place] photorefractive keratectomy; LASIK laser assisted in situ keratomileusis; HTN hypertension; DM diabetes mellitus; COPD chronic obstructive pulmonary disease

## 2022-08-13 ENCOUNTER — Encounter (INDEPENDENT_AMBULATORY_CARE_PROVIDER_SITE_OTHER): Payer: Self-pay | Admitting: Ophthalmology

## 2022-08-13 ENCOUNTER — Ambulatory Visit (INDEPENDENT_AMBULATORY_CARE_PROVIDER_SITE_OTHER): Payer: Medicare HMO | Admitting: Ophthalmology

## 2022-08-13 DIAGNOSIS — Z961 Presence of intraocular lens: Secondary | ICD-10-CM | POA: Diagnosis not present

## 2022-08-13 DIAGNOSIS — H35033 Hypertensive retinopathy, bilateral: Secondary | ICD-10-CM | POA: Diagnosis not present

## 2022-08-13 DIAGNOSIS — I10 Essential (primary) hypertension: Secondary | ICD-10-CM | POA: Diagnosis not present

## 2022-08-13 DIAGNOSIS — H35372 Puckering of macula, left eye: Secondary | ICD-10-CM

## 2022-08-13 DIAGNOSIS — H3322 Serous retinal detachment, left eye: Secondary | ICD-10-CM

## 2022-08-13 DIAGNOSIS — H33311 Horseshoe tear of retina without detachment, right eye: Secondary | ICD-10-CM | POA: Diagnosis not present

## 2022-08-13 DIAGNOSIS — H40052 Ocular hypertension, left eye: Secondary | ICD-10-CM | POA: Diagnosis not present

## 2022-08-13 DIAGNOSIS — H35352 Cystoid macular degeneration, left eye: Secondary | ICD-10-CM

## 2022-08-13 MED ORDER — TRIAMCINOLONE ACETONIDE 40 MG/ML IJ SUSP FOR KALEIDOSCOPE
40.0000 mg | INTRAMUSCULAR | Status: AC | PRN
  Administered 2022-08-13: 40 mg

## 2022-08-19 ENCOUNTER — Other Ambulatory Visit (HOSPITAL_COMMUNITY): Payer: Self-pay | Admitting: Family Medicine

## 2022-08-19 DIAGNOSIS — M810 Age-related osteoporosis without current pathological fracture: Secondary | ICD-10-CM

## 2022-08-22 DIAGNOSIS — Z1331 Encounter for screening for depression: Secondary | ICD-10-CM | POA: Diagnosis not present

## 2022-08-22 DIAGNOSIS — J45909 Unspecified asthma, uncomplicated: Secondary | ICD-10-CM | POA: Diagnosis not present

## 2022-08-22 DIAGNOSIS — E7800 Pure hypercholesterolemia, unspecified: Secondary | ICD-10-CM | POA: Diagnosis not present

## 2022-08-22 DIAGNOSIS — E559 Vitamin D deficiency, unspecified: Secondary | ICD-10-CM | POA: Diagnosis not present

## 2022-08-22 DIAGNOSIS — M810 Age-related osteoporosis without current pathological fracture: Secondary | ICD-10-CM | POA: Diagnosis not present

## 2022-08-22 DIAGNOSIS — Z6841 Body Mass Index (BMI) 40.0 and over, adult: Secondary | ICD-10-CM | POA: Diagnosis not present

## 2022-08-22 DIAGNOSIS — Z0000 Encounter for general adult medical examination without abnormal findings: Secondary | ICD-10-CM | POA: Diagnosis not present

## 2022-08-22 DIAGNOSIS — R609 Edema, unspecified: Secondary | ICD-10-CM | POA: Diagnosis not present

## 2022-08-22 DIAGNOSIS — M1990 Unspecified osteoarthritis, unspecified site: Secondary | ICD-10-CM | POA: Diagnosis not present

## 2022-08-22 DIAGNOSIS — J309 Allergic rhinitis, unspecified: Secondary | ICD-10-CM | POA: Diagnosis not present

## 2022-08-22 DIAGNOSIS — K219 Gastro-esophageal reflux disease without esophagitis: Secondary | ICD-10-CM | POA: Diagnosis not present

## 2022-08-22 DIAGNOSIS — I10 Essential (primary) hypertension: Secondary | ICD-10-CM | POA: Diagnosis not present

## 2022-08-25 ENCOUNTER — Ambulatory Visit (HOSPITAL_COMMUNITY)
Admission: RE | Admit: 2022-08-25 | Discharge: 2022-08-25 | Disposition: A | Discharge status: Home / Self Care | Payer: Medicare HMO | Source: Ambulatory Visit | Attending: Family Medicine | Admitting: Family Medicine

## 2022-08-25 ENCOUNTER — Encounter (HOSPITAL_COMMUNITY): Payer: Self-pay

## 2022-08-25 DIAGNOSIS — Z1231 Encounter for screening mammogram for malignant neoplasm of breast: Secondary | ICD-10-CM | POA: Diagnosis not present

## 2022-09-03 ENCOUNTER — Ambulatory Visit (HOSPITAL_COMMUNITY)
Admission: RE | Admit: 2022-09-03 | Discharge: 2022-09-03 | Disposition: A | Discharge status: Home / Self Care | Payer: Medicare HMO | Source: Ambulatory Visit | Attending: Family Medicine | Admitting: Family Medicine

## 2022-09-03 ENCOUNTER — Encounter (HOSPITAL_COMMUNITY): Payer: Self-pay

## 2022-09-03 DIAGNOSIS — M810 Age-related osteoporosis without current pathological fracture: Secondary | ICD-10-CM

## 2022-09-03 DIAGNOSIS — M85852 Other specified disorders of bone density and structure, left thigh: Secondary | ICD-10-CM | POA: Diagnosis not present

## 2022-09-03 DIAGNOSIS — Z780 Asymptomatic menopausal state: Secondary | ICD-10-CM | POA: Diagnosis not present

## 2022-09-11 DIAGNOSIS — M810 Age-related osteoporosis without current pathological fracture: Secondary | ICD-10-CM | POA: Diagnosis not present

## 2022-09-22 NOTE — Progress Notes (Addendum)
[facility] - Clinic Note  10/01/2022    CHIEF COMPLAINT Patient presents for Retina Follow Up   HISTORY OF PRESENT ILLNESS: Alicia Miranda is a 67 y.o. female who presents to the clinic today for:   HPI     Retina Follow Up   In left eye.  This started 6 weeks ago.  Duration of 6 weeks.  Since onset it is stable.  I, the attending physician,  performed the HPI with the patient and updated documentation appropriately.        Comments   6 week retina follow up CME OS pt is reporting no vision changes noticed she denies any flashes or floaters       Last edited by Rennis Chris, MD on 10/01/2022  7:53 PM.     Referring physician: Tally Joe, MD [phone] [address] [place],  [place] [address]  HISTORICAL INFORMATION:   Selected notes from the MEDICAL RECORD NUMBER Referred by Dr. Illene Labrador for possible RD OS LEE: 03/16/02023 Ocular Hx-  PMH- HTN    CURRENT MEDICATIONS: Current Outpatient Medications (Ophthalmic Drugs)  Medication Sig   latanoprost (XALATAN) 0.005 % ophthalmic solution Place 1 drop into the left eye at bedtime.   brimonidine (ALPHAGAN) 0.2 % ophthalmic solution INSTILL 1 DROP INTO LEFT EYE THREE TIMES DAILY   Bromfenac Sodium (PROLENSA) 0.07 % SOLN Place 1 drop into the left eye in the morning, at noon, in the evening, and at bedtime. PT NEEDS 2 BOTTLES DISPENSED AT A TIME-CALL IF COUPON IS NEEDED   dorzolamide (TRUSOPT) 2 % ophthalmic solution Place 1 drop into the left eye 3 (three) times daily.   dorzolamide-timolol (COSOPT) 2-0.5 % ophthalmic solution Place 1 drop into the left eye in the morning, at noon, and at bedtime.   LOTEPREDNOL ETABONATE OP Place 1 drop into the right eye 4 (four) times daily.   prednisoLONE acetate (PRED FORTE) 1 % ophthalmic suspension INSTILL 1 DROP INTO LEFT EYE 4 TIMES DAILY (Patient not taking: Reported on 08/13/2022)   prednisoLONE acetate (PRED FORTE) 1 % ophthalmic suspension Place 1 drop  into the left eye 4 (four) times daily. (Patient not taking: Reported on 08/13/2022)   No current facility-administered medications for this visit. (Ophthalmic Drugs)   Current Outpatient Medications (Other)  Medication Sig   APPLE CIDER VINEGAR PO Take 2 tablets by mouth daily. gummies   Cholecalciferol (VITAMIN D3) 10 MCG (400 UNIT) tablet Take 400 Units by mouth daily.   CINNAMON PO Take 1,000 mg by mouth every other day.   COLLAGEN PO Take 3 capsules by mouth daily.   Denosumab (PROLIA ) Inject 60 mg into the skin every 6 (six) months.   FEROSUL 325 (65 Fe) MG tablet Take 325 mg by mouth daily.   fexofenadine (ALLEGRA) 180 MG tablet Take 180 mg by mouth daily.   fluticasone (FLONASE) 50 MCG/ACT nasal spray Place 1 spray into both nostrils daily.   furosemide (LASIX) 40 MG tablet Take 40 mg by mouth 2 (two) times daily.   Glucosamine HCl (GLUCOSAMINE PO) Take 2 tablets by mouth daily.   losartan (COZAAR) 50 MG tablet Take 100 mg by mouth daily.   Magnesium 400 MG CAPS Take 400 mg by mouth daily.   Melatonin 5 MG CAPS Take 10 mg by mouth at bedtime.   pantoprazole (PROTONIX) 40 MG tablet Take 40 mg by mouth 2 (two) times daily.   rosuvastatin (CRESTOR) 20 MG tablet Take 20 mg  by mouth daily.   TURMERIC PO Take 2 tablets by mouth daily.   No current facility-administered medications for this visit. (Other)   REVIEW OF SYSTEMS: ROS   Positive for: Musculoskeletal, Cardiovascular, Eyes, Respiratory Negative for: Constitutional, Gastrointestinal, Neurological, Skin, Genitourinary, HENT, Endocrine, Psychiatric, Allergic/Imm, Heme/Lymph Last edited by Etheleen MayhewMendenhall, Jennifer W, COT on 10/01/2022  9:26 AM.     ALLERGIES Allergies  Allergen Reactions   Codeine Itching   PAST MEDICAL HISTORY Past Medical History:  Diagnosis Date   Allergy    Arthritis    legs   Asthma    GERD (gastroesophageal reflux disease)    Hyperlipidemia    Hypertension    Past Surgical History:   Procedure Laterality Date   ABDOMINAL HYSTERECTOMY     CHOLECYSTECTOMY     COLONOSCOPY     GAS INSERTION Left 09/05/2021   Procedure: INSERTION OF GAS - C3F8;  Surgeon: Rennis ChrisZamora, Jossilyn Benda, MD;  [facility];  Service: Ophthalmology;  Laterality: Left;   GAS/FLUID EXCHANGE Left 09/05/2021   Procedure: GAS/FLUID EXCHANGE;  Surgeon: Rennis ChrisZamora, Bernerd Terhune, MD;  [facility];  Service: Ophthalmology;  Laterality: Left;   PARS PLANA VITRECTOMY Left 09/05/2021   Procedure: PARS PLANA VITRECTOMY WITH 25 GAUGE;  Surgeon: Rennis ChrisZamora, Timberlynn Kizziah, MD;  [facility];  Service: Ophthalmology;  Laterality: Left;   PERFLUORONE INJECTION Left 09/05/2021   Procedure: PERFLUORON INJECTION;  Surgeon: Rennis ChrisZamora, Keandra Medero, MD;  [facility];  Service: Ophthalmology;  Laterality: Left;   PHOTOCOAGULATION WITH LASER Left 09/05/2021   Procedure: PHOTOCOAGULATION WITH LASER;  Surgeon: Rennis ChrisZamora, Refugia Laneve, MD;  [facility];  Service: Ophthalmology;  Laterality: Left;   SCLERAL BUCKLE Left 09/05/2021   Procedure: SCLERAL BUCKLE;  Surgeon: Rennis ChrisZamora, Harriett Azar, MD;  [facility];  Service: Ophthalmology;  Laterality: Left;   UPPER GASTROINTESTINAL ENDOSCOPY     FAMILY HISTORY Family History  Problem Relation Age of Onset   Retinal detachment Sister    Diabetes Sister    Glaucoma Brother    Diabetes Brother    Colon cancer Brother        dx at [phone]   Esophageal cancer Neg Hx    Rectal cancer Neg Hx    Stomach cancer Neg Hx    SOCIAL HISTORY Social History   Tobacco Use   Smoking status: Former   Smokeless tobacco: Never   Tobacco comments:    quit smoking in 2002 or 2003  Vaping Use   Vaping Use: Never used  Substance Use Topics   Alcohol use: Yes    Comment: very rarely   Drug use: No       OPHTHALMIC EXAM:  Base Eye Exam     Visual Acuity (Snellen - Linear)       Right Left   Dist [place] 20/20 -1 20/200   Dist ph [place]  NI         Tonometry (Tonopen, 9:32 AM)       Right Left   Pressure 14 27         Pupils        Pupils Dark Light Shape React APD   Right PERRL 3 2 Round Brisk None   Left PERRL 3 2 Round Brisk None         Visual Fields       Left Right    Full Full         Extraocular Movement       Right Left    Full, Ortho Full,  Ortho         Neuro/Psych     Oriented x3: Yes   Mood/Affect: Normal         Dilation     Both eyes: 2.5% Phenylephrine @ 9:32 AM           Slit Lamp and Fundus Exam     External Exam       Right Left   External  Periorbital edema -- improved         Slit Lamp Exam       Right Left   Lids/Lashes Dermatochalasis - upper lid Dermatochalasis - upper lid   Conjunctiva/Sclera white and quiet White and quiet; sub conj hemes and mild residual STK ST quad   Cornea trace PEE, mild tear film debris, well healed cataract wound 3-4+ Punctate epithelial erosions, mild corneal haze, well healed cataract wound, +MCE   Anterior Chamber deep and clear deep and clear   Iris round and dilated round and dilated   Lens PC IOL in good position PC IOL in good position   Anterior Vitreous syneresis, asteroid hyalosis, PVD post vitrectomy         Fundus Exam       Right Left   Disc Pink and Sharp mild Pallor, Sharp rim, Compact   C/D Ratio 0.4 0.3   Macula flat, good foveal reflex, mild RPE mottling, No heme or edema Flat, Blunted foveal reflex, RPE mottling, ERM with striae greatest nasal mac, cystic changes centrally and nasally - persistent/slightly increased, No heme   Vessels attenuated, Tortuous attenuated, Tortuous   Periphery Attached, operculated hole @ 1030 with mild surrounding pigment -- good laser surrounding, no SRF, no new RT/RD Trace pockets of SRF superior to disc with fibrosis, Retina attached over buckle, good buckle height, good laser over buckle and around tears, PRE-OP: small tear with fibrosis @ 0200, bullous, temporal RD from 1230 to 0430           Refraction     Wearing Rx       Sphere Cylinder Axis Add    Right +1.00 +1.00 130 +2.75   Left +1.25 +0.75 017 +2.75           IMAGING AND PROCEDURES  Imaging and Procedures for 10/01/2022  OCT, Retina - OU - Both Eyes       Right Eye Quality was good. Central Foveal Thickness: 275. Progression has been stable. Findings include normal foveal contour, no IRF, no SRF.   Left Eye Quality was good. Central Foveal Thickness: 567. Progression has worsened. Findings include no SRF, abnormal foveal contour, epiretinal membrane, intraretinal fluid, macular pucker (Retina stably re-attached, ERM with blunted foveal contour and mild pucker, mild interval increase in cystic changes/diffuse edema, focal pockets of SRF superior to disc -- persistent ).   Notes *Images captured and stored on drive  Diagnosis / Impression:  OD: NFP; no IRF/SRF [id number]:Retina stably re-attached, ERM with blunted foveal contour and mild pucker, mild interval increase in cystic changes/diffuse edema, focal pockets of SRF superior to disc -- persistent   Clinical management:  See below  Abbreviations: NFP - Normal foveal profile. CME - cystoid macular edema. PED - pigment epithelial detachment. IRF - intraretinal fluid. SRF - subretinal fluid. EZ - ellipsoid zone. ERM - epiretinal membrane. ORA - outer retinal atrophy. ORT - outer retinal tubulation. SRHM - subretinal hyper-reflective material. IRHM - intraretinal hyper-reflective material            ASSESSMENT/PLAN:  ICD-10-CM   1. Left retinal detachment  H33.22     2. Cystoid macular edema of left eye  H35.352 OCT, Retina - OU - Both Eyes    CANCELED: Intravitreal Injection, Pharmacologic Agent - OS - Left Eye    CANCELED: Injection into Tenon's Capsule - OS - Left Eye    3. Epiretinal membrane (ERM) of left eye  H35.372     4. Retinal tear of right eye  H33.311     5. Essential hypertension  I10     6. Hypertensive retinopathy of both eyes  H35.033     7. Pseudophakia of both eyes  Z96.1     8. 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 Corneal edema of left eye  H18.20 Paracentesis of Anterior Chamber, Therapeutic - OS - Left Eye     1,2. Rhegmatogenous retinal detachment with retinal hole, left eye  - s/p STK #1 (11.20.23) for CME, #2 (02.28.24) - bullous, temporal, mac off detachment, onset of foveal involvement Tuesday, 03.14.23 by history - detached from 1230 to 430 oclock, fovea off, small tear at 0200 - s/p SBP + PPV/PFO/EL/FAX/14% C3F8 OS, 03.23.2023 - retina attached and in good position -- good buckle height and laser around breaks  - BCVA OS 20/150 -- decreased - OCT shows ZO:XWRUEA stably re-attached, ERM with blunted foveal contour and mild pucker, mild interval increase in cystic changes nasal macula and fovea, focal pockets of SRF superior to disc -- persistent  - FA 12.18.23 shows OD Focal staining ST periphery corresponding to laser retinopexy, OS Mild Hyperfluorescence of disc, mild perifoveal staining SN to fovea--?petaloid -- suggestive of CME - STK informed consent obtained and signed, 11.20.23 (OS)             - IOP OS 27 today -- likely steroid response             - continue Prolensa QID OS for CME  - continue holding PF  - continue Brimonidine TID OS  - cont Cosopt TID OS  - begin Latanoprost QHS OS due to elevated IOP on 04.17.24  - f/u 2 weeks - IOP check, DFE, OCT  3. Epiretinal membrane, left eye  - ERM with persistent cystic edema - OCT shows persistent ERM w/ cystic changes slightly increased - monitor for now, but discussed possibility of repeat PPV w/ membrane peel to remove ERM if vision fails to improve  4. Retinal hole OD  - operculated retinal hole located at 1030 with mild surrounding pigment, no SRF - s/p laser retinopexy OD (03.16.23) -- good laser surrounding - continue to monitor  5,6. Hypertensive retinopathy OU - discussed importance of tight BP control - continue to  monitor  7. Pseudophakia OU  - s/p CE/IOL OS (Dr. Zenaida Niece, 10.04.23), CE with IOL OD (11.01.23, Dr. Zenaida Niece)  - IOL in good position, doing well  - continue to monitor  8. Ocular hypertension OS  - likely steroid response  - tmax 33 on 01.29.24  - s/p SLT OS w/ Dr. Wynelle Link on 12.05.23  - IOP OS 27 on 04.17.24 -- post-STK OS #2  - recommend AC tap OS today, 04.17.24, to normalize IOP OS - RBA of procedure discussed, questions answered - informed consent obtained - 0.06 cc of aqueous removed from anterior chamber OS without complication  - cont brim and cosopt TID OS  - begin Latanoprost QHS OS (04.17.24)  Ophthalmic  Meds Ordered this visit:  Meds ordered this encounter  Medications   latanoprost (XALATAN) 0.005 % ophthalmic solution    Sig: Place 1 drop into the left eye at bedtime.    Dispense:  2.5 mL    Refill:  3     Return for 2 wks CME OS, IOP check, DFE, OCT.  There are no Patient Instructions on file for this visit.  Explained the diagnoses, plan, and follow up with the patient and they expressed understanding.  Patient expressed understanding of the importance of proper follow up care.   This document serves as a record of services personally performed by Karie Chimera, MD, PhD. It was created on their behalf by De Blanch, an ophthalmic technician. The creation of this record is the provider's dictation and/or activities during the visit.    Electronically signed by: De Blanch, OA, 10/01/22  8:01 PM  This document serves as a record of services personally performed by Karie Chimera, MD, PhD. It was created on their behalf by De Blanch, an ophthalmic technician. The creation of this record is the provider's dictation and/or activities during the visit.    Electronically signed by: De Blanch, OA, 10/01/22  8:01 PM   Karie Chimera, M.D., Ph.D. Diseases & Surgery of the Retina and Vitreous [facility]  I have reviewed the  above documentation for accuracy and completeness, and I agree with the above. Karie Chimera, M.D., Ph.D. 10/01/22 8:01 PM   Abbreviations: M myopia (nearsighted); A astigmatism; H hyperopia (farsighted); P presbyopia; Mrx spectacle prescription;  CTL contact lenses; OD right eye; OS left eye; OU both eyes  XT exotropia; ET esotropia; PEK punctate epithelial keratitis; PEE punctate epithelial erosions; DES dry eye syndrome; MGD meibomian gland dysfunction; ATs artificial tears; PFAT's preservative free artificial tears; NSC nuclear sclerotic cataract; PSC posterior subcapsular cataract; ERM epi-retinal membrane; PVD posterior vitreous detachment; RD retinal detachment; DM diabetes mellitus; DR diabetic retinopathy; NPDR non-proliferative diabetic retinopathy; PDR proliferative diabetic retinopathy; CSME clinically significant macular edema; DME diabetic macular edema; dbh dot blot hemorrhages; CWS cotton wool spot; POAG primary open angle glaucoma; C/D cup-to-disc ratio; HVF humphrey visual field; GVF goldmann visual field; OCT optical coherence tomography; IOP intraocular pressure; BRVO Branch retinal vein occlusion; CRVO central retinal vein occlusion; CRAO central retinal artery occlusion; BRAO branch retinal artery occlusion; RT retinal tear; SB scleral buckle; PPV pars plana vitrectomy; VH Vitreous hemorrhage; PRP panretinal laser photocoagulation; IVK intravitreal kenalog; VMT vitreomacular traction; MH Macular hole;  NVD neovascularization of the disc; NVE neovascularization elsewhere; AREDS age related eye disease study; ARMD age related macular degeneration; POAG primary open angle glaucoma; EBMD epithelial/anterior basement membrane dystrophy; ACIOL anterior chamber intraocular lens; IOL intraocular lens; PCIOL posterior chamber intraocular lens; Phaco/IOL phacoemulsification with intraocular lens placement; PRK photorefractive keratectomy; LASIK laser assisted in situ keratomileusis; HTN  hypertension; DM diabetes mellitus; COPD chronic obstructive pulmonary disease

## 2022-10-01 ENCOUNTER — Encounter (INDEPENDENT_AMBULATORY_CARE_PROVIDER_SITE_OTHER): Payer: Self-pay | Admitting: Ophthalmology

## 2022-10-01 ENCOUNTER — Ambulatory Visit (INDEPENDENT_AMBULATORY_CARE_PROVIDER_SITE_OTHER): Payer: Medicare HMO | Admitting: Ophthalmology

## 2022-10-01 DIAGNOSIS — H35352 Cystoid macular degeneration, left eye: Secondary | ICD-10-CM | POA: Diagnosis not present

## 2022-10-01 DIAGNOSIS — H1820 Unspecified corneal edema: Secondary | ICD-10-CM

## 2022-10-01 DIAGNOSIS — H40052 Ocular hypertension, left eye: Secondary | ICD-10-CM

## 2022-10-01 DIAGNOSIS — I10 Essential (primary) hypertension: Secondary | ICD-10-CM

## 2022-10-01 DIAGNOSIS — Z961 Presence of intraocular lens: Secondary | ICD-10-CM

## 2022-10-01 DIAGNOSIS — H3322 Serous retinal detachment, left eye: Secondary | ICD-10-CM | POA: Diagnosis not present

## 2022-10-01 DIAGNOSIS — H35372 Puckering of macula, left eye: Secondary | ICD-10-CM

## 2022-10-01 DIAGNOSIS — H35033 Hypertensive retinopathy, bilateral: Secondary | ICD-10-CM | POA: Diagnosis not present

## 2022-10-01 DIAGNOSIS — H33311 Horseshoe tear of retina without detachment, right eye: Secondary | ICD-10-CM

## 2022-10-01 MED ORDER — LATANOPROST 0.005 % OP SOLN: 1.0000 [drp] | Freq: Every day | OPHTHALMIC | 3 refills | Status: AC

## 2022-10-04 DIAGNOSIS — Z809 Family history of malignant neoplasm, unspecified: Secondary | ICD-10-CM | POA: Diagnosis not present

## 2022-10-04 DIAGNOSIS — K219 Gastro-esophageal reflux disease without esophagitis: Secondary | ICD-10-CM | POA: Diagnosis not present

## 2022-10-04 DIAGNOSIS — R609 Edema, unspecified: Secondary | ICD-10-CM | POA: Diagnosis not present

## 2022-10-04 DIAGNOSIS — Z008 Encounter for other general examination: Secondary | ICD-10-CM | POA: Diagnosis not present

## 2022-10-04 DIAGNOSIS — Z8249 Family history of ischemic heart disease and other diseases of the circulatory system: Secondary | ICD-10-CM | POA: Diagnosis not present

## 2022-10-04 DIAGNOSIS — I739 Peripheral vascular disease, unspecified: Secondary | ICD-10-CM | POA: Diagnosis not present

## 2022-10-04 DIAGNOSIS — E785 Hyperlipidemia, unspecified: Secondary | ICD-10-CM | POA: Diagnosis not present

## 2022-10-04 DIAGNOSIS — Z87891 Personal history of nicotine dependence: Secondary | ICD-10-CM | POA: Diagnosis not present

## 2022-10-04 DIAGNOSIS — M1990 Unspecified osteoarthritis, unspecified site: Secondary | ICD-10-CM | POA: Diagnosis not present

## 2022-10-04 DIAGNOSIS — M810 Age-related osteoporosis without current pathological fracture: Secondary | ICD-10-CM | POA: Diagnosis not present

## 2022-10-04 DIAGNOSIS — I10 Essential (primary) hypertension: Secondary | ICD-10-CM | POA: Diagnosis not present

## 2022-10-04 DIAGNOSIS — J45909 Unspecified asthma, uncomplicated: Secondary | ICD-10-CM | POA: Diagnosis not present

## 2022-10-10 NOTE — Progress Notes (Shared)
[facility] - Clinic Note  10/15/2022    CHIEF COMPLAINT Patient presents for Retina Follow Up   HISTORY OF PRESENT ILLNESS: Alicia Miranda is a 67 y.o. female who presents to the clinic today for:   HPI     Retina Follow Up   Patient presents with  Other.  In left eye.  This started 2 weeks ago.  I, the attending physician,  performed the HPI with the patient and updated documentation appropriately.        Comments   Patient here for 2 weeks retina follow up for CME OS/IOP check. Patient states vision doing good. Trying to get better. No eye pain. Using drops.      Last edited by Rennis Chris, MD on 10/15/2022  5:13 PM.    Pt is using Cosopt and brimonidine TID OS, latanoprost QHS OS and Prolensa QID OS  Referring physician: Tally Joe, MD [phone] [address] [place],  [place] [address]  HISTORICAL INFORMATION:   Selected notes from the MEDICAL RECORD NUMBER Referred by Dr. Illene Labrador for possible RD OS LEE: 03/16/02023 Ocular Hx-  PMH- HTN    CURRENT MEDICATIONS: Current Outpatient Medications (Ophthalmic Drugs)  Medication Sig   brimonidine (ALPHAGAN) 0.2 % ophthalmic solution INSTILL 1 DROP INTO LEFT EYE THREE TIMES DAILY   Bromfenac Sodium (PROLENSA) 0.07 % SOLN Place 1 drop into the left eye in the morning, at noon, in the evening, and at bedtime. PT NEEDS 2 BOTTLES DISPENSED AT A TIME-CALL IF COUPON IS NEEDED   dorzolamide-timolol (COSOPT) 2-0.5 % ophthalmic solution Place 1 drop into the left eye in the morning, at noon, and at bedtime.   latanoprost (XALATAN) 0.005 % ophthalmic solution Place 1 drop into the left eye at bedtime.   LOTEPREDNOL ETABONATE OP Place 1 drop into the right eye 4 (four) times daily.   dorzolamide (TRUSOPT) 2 % ophthalmic solution Place 1 drop into the left eye 3 (three) times daily. (Patient not taking: Reported on 10/15/2022)   prednisoLONE acetate (PRED FORTE) 1 % ophthalmic suspension INSTILL 1 DROP INTO  LEFT EYE 4 TIMES DAILY (Patient not taking: Reported on 08/13/2022)   prednisoLONE acetate (PRED FORTE) 1 % ophthalmic suspension Place 1 drop into the left eye 4 (four) times daily. (Patient not taking: Reported on 08/13/2022)   No current facility-administered medications for this visit. (Ophthalmic Drugs)   Current Outpatient Medications (Other)  Medication Sig   APPLE CIDER VINEGAR PO Take 2 tablets by mouth daily. gummies   Cholecalciferol (VITAMIN D3) 10 MCG (400 UNIT) tablet Take 400 Units by mouth daily.   CINNAMON PO Take 1,000 mg by mouth every other day.   COLLAGEN PO Take 3 capsules by mouth daily.   Denosumab (PROLIA [place]) Inject 60 mg into the skin every 6 (six) months.   FEROSUL 325 (65 Fe) MG tablet Take 325 mg by mouth daily.   fexofenadine (ALLEGRA) 180 MG tablet Take 180 mg by mouth daily.   fluticasone (FLONASE) 50 MCG/ACT nasal spray Place 1 spray into both nostrils daily.   furosemide (LASIX) 40 MG tablet Take 40 mg by mouth 2 (two) times daily.   Glucosamine HCl (GLUCOSAMINE PO) Take 2 tablets by mouth daily.   losartan (COZAAR) 50 MG tablet Take 100 mg by mouth daily.   Magnesium 400 MG CAPS Take 400 mg by mouth daily.   Melatonin 5 MG CAPS Take 10 mg by mouth at bedtime.   pantoprazole (PROTONIX) 40  MG tablet Take 40 mg by mouth 2 (two) times daily.   rosuvastatin (CRESTOR) 20 MG tablet Take 20 mg by mouth daily.   TURMERIC PO Take 2 tablets by mouth daily.   No current facility-administered medications for this visit. (Other)   REVIEW OF SYSTEMS: ROS   Positive for: Musculoskeletal, Cardiovascular, Eyes, Respiratory Negative for: Constitutional, Gastrointestinal, Neurological, Skin, Genitourinary, HENT, Endocrine, Psychiatric, Allergic/Imm, Heme/Lymph Last edited by Laddie Aquas, COA on 10/15/2022  1:10 PM.      ALLERGIES Allergies  Allergen Reactions   Codeine Itching   PAST MEDICAL HISTORY Past Medical History:  Diagnosis Date   Allergy     Arthritis    legs   Asthma    GERD (gastroesophageal reflux disease)    Hyperlipidemia    Hypertension    Past Surgical History:  Procedure Laterality Date   ABDOMINAL HYSTERECTOMY     CHOLECYSTECTOMY     COLONOSCOPY     GAS INSERTION Left 09/05/2021   Procedure: INSERTION OF GAS - C3F8;  Surgeon: Rennis Chris, MD;  Location: [facility] OR;  Service: Ophthalmology;  Laterality: Left;   GAS/FLUID EXCHANGE Left 09/05/2021   Procedure: GAS/FLUID EXCHANGE;  Surgeon: Rennis Chris, MD;  Location: [facility] OR;  Service: Ophthalmology;  Laterality: Left;   PARS PLANA VITRECTOMY Left 09/05/2021   Procedure: PARS PLANA VITRECTOMY WITH 25 GAUGE;  Surgeon: Rennis Chris, MD;  Location: [facility] OR;  Service: Ophthalmology;  Laterality: Left;   PERFLUORONE INJECTION Left 09/05/2021   Procedure: PERFLUORON INJECTION;  Surgeon: Rennis Chris, MD;  Location: [facility] OR;  Service: Ophthalmology;  Laterality: Left;   PHOTOCOAGULATION WITH LASER Left 09/05/2021   Procedure: PHOTOCOAGULATION WITH LASER;  Surgeon: Rennis Chris, MD;  Location: [facility] OR;  Service: Ophthalmology;  Laterality: Left;   SCLERAL BUCKLE Left 09/05/2021   Procedure: SCLERAL BUCKLE;  Surgeon: Rennis Chris, MD;  Location: [facility] OR;  Service: Ophthalmology;  Laterality: Left;   UPPER GASTROINTESTINAL ENDOSCOPY     FAMILY HISTORY Family History  Problem Relation Age of Onset   Retinal detachment Sister    Diabetes Sister    Glaucoma Brother    Diabetes Brother    Colon cancer Brother        dx at 68   Esophageal cancer Neg Hx    Rectal cancer Neg Hx    Stomach cancer Neg Hx    SOCIAL HISTORY Social History   Tobacco Use   Smoking status: Former   Smokeless tobacco: Never   Tobacco comments:    quit smoking in 2002 or 2003  Vaping Use   Vaping Use: Never used  Substance Use Topics   Alcohol use: Yes    Comment: very rarely   Drug use: No       OPHTHALMIC EXAM:  Base Eye Exam     Visual Acuity (Snellen - Linear)       Right Left   Dist [place]  20/20 -2 20/200 -2   Dist ph [place]  NI         Tonometry (Tonopen, 1:08 PM)       Right Left   Pressure 14 21         Pupils       Dark Light Shape React APD   Right 3 2 Round Brisk None   Left 3 2 Round Brisk None         Visual Fields (Counting fingers)       Left Right    Full Full  Extraocular Movement       Right Left    Full, Ortho Full, Ortho         Neuro/Psych     Oriented x3: Yes   Mood/Affect: Normal         Dilation     Both eyes: 1.0% Mydriacyl, 2.5% Phenylephrine @ 1:08 PM           Slit Lamp and Fundus Exam     External Exam       Right Left   External  Periorbital edema -- improved         Slit Lamp Exam       Right Left   Lids/Lashes Dermatochalasis - upper lid Dermatochalasis - upper lid   Conjunctiva/Sclera white and quiet White and quiet; sub conj heme, STK ST quad   Cornea trace PEE, mild tear film debris, well healed cataract wound 2+PEE, mild tear film debris, well healed cataract wound, inferior paracentral corneal haze   Anterior Chamber deep and clear deep and clear   Iris round and dilated round and dilated   Lens PC IOL in good position PC IOL in good position   Anterior Vitreous syneresis, asteroid hyalosis, PVD post vitrectomy         Fundus Exam       Right Left   Disc Pink and Sharp mild Pallor, Sharp rim, Compact   C/D Ratio 0.4 0.3   Macula flat, good foveal reflex, mild RPE mottling, No heme or edema Flat, Blunted foveal reflex, RPE mottling, ERM with striae greatest nasal mac, cystic changes centrally and nasally - persistent/slightly increased, No heme   Vessels attenuated, Tortuous attenuated, Tortuous   Periphery Attached, operculated hole @ 1030 with mild surrounding pigment -- good laser surrounding, no SRF, no new RT/RD Trace pockets of SRF superior to disc with fibrosis, Retina attached over buckle, good buckle height, good laser over buckle and around tears, PRE-OP: small tear with  fibrosis @ 0200, bullous, temporal RD from 1230 to 0430           Refraction     Wearing Rx       Sphere Cylinder Axis Add   Right +1.00 +1.00 130 +2.75   Left +1.25 +0.75 017 +2.75           IMAGING AND PROCEDURES  Imaging and Procedures for 10/15/2022  OCT, Retina - OU - Both Eyes       Right Eye Quality was good. Central Foveal Thickness: 274. Progression has been stable. Findings include normal foveal contour, no IRF, no SRF.   Left Eye Quality was good. Central Foveal Thickness: 598. Progression has worsened. Findings include no SRF, abnormal foveal contour, epiretinal membrane, intraretinal fluid, macular pucker (Retina stably re-attached, ERM with blunted foveal contour and mild pucker, mild interval increase in cystic changes/diffuse edema, focal pockets of SRF superior to disc -- persistent ).   Notes *Images captured and stored on drive  Diagnosis / Impression:  OD: NFP; no IRF/SRF [id number] stably re-attached, ERM with blunted foveal contour and mild pucker, mild interval increase in cystic changes/diffuse edema, focal pockets of SRF superior to disc -- persistent   Clinical management:  See below  Abbreviations: NFP - Normal foveal profile. CME - cystoid macular edema. PED - pigment epithelial detachment. IRF - intraretinal fluid. SRF - subretinal fluid. EZ - ellipsoid zone. ERM - epiretinal membrane. ORA - outer retinal atrophy. ORT - outer retinal tubulation. SRHM - subretinal hyper-reflective material. IRHM - intraretinal  hyper-reflective material      Intravitreal Injection, Pharmacologic Agent - OS - Left Eye       Time Out 10/15/2022. 1:39 PM. Confirmed correct patient, procedure, site, and patient consented.   Anesthesia Topical anesthesia was used. Anesthetic medications included Lidocaine 2%, Proparacaine 0.5%.   Procedure Preparation included 5% betadine to ocular surface, eyelid speculum. A supplied needle was used.   Injection: 1.25 mg  Bevacizumab 1.25mg /0.32ml   Route: Intravitreal, Site: Left Eye   NDC: 16109-604-54, Lot: [id number] , Expiration date: 11/14/2022   Post-op Post injection exam found visual acuity of at least counting fingers. The patient tolerated the procedure well. There were no complications. The patient received written and verbal post procedure care education.            ASSESSMENT/PLAN:    ICD-10-CM   1. Left retinal detachment  H33.22     2. Cystoid macular edema of left eye  H35.352 OCT, Retina - OU - Both Eyes    Intravitreal Injection, Pharmacologic Agent - OS - Left Eye    Bevacizumab (AVASTIN) SOLN 1.25 mg    3. Epiretinal membrane (ERM) of left eye  H35.372     4. Retinal tear of right eye  H33.311     5. Essential hypertension  I10     6. Hypertensive retinopathy of both eyes  H35.033 Intravitreal Injection, Pharmacologic Agent - OS - Left Eye    Bevacizumab (AVASTIN) SOLN 1.25 mg    7. 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 Rhegmatogenous retinal detachment with retinal hole, left eye - bullous, temporal, mac off detachment, onset of foveal involvement Tuesday, 03.14.23 by history - detached from 1230 to 430 oclock, fovea off, small tear at 0200 - s/p SBP + PPV/PFO/EL/FAX/14% C3F8 OS, 03.23.2023 - retina attached and in good position -- good buckle height and laser around breaks  - BCVA OS 20/200 -- decreased - OCT shows WC:BJSEGB stably re-attached, ERM with blunted foveal contour and mild pucker, mild interval increase in cystic changes nasal macula and fovea, focal pockets of SRF superior to disc -- persistent  - FA 12.18.23 shows OD Focal staining ST periphery corresponding to laser retinopexy, OS Mild Hyperfluorescence of disc, mild perifoveal staining SN to fovea--?petaloid -- suggestive of CME - s/p STK #1 (11.20.23) for CME, #2 (02.28.24) - IOP OS 21 today -- h/o steroid response             - continue Prolensa QID  OS for CME  - continue holding PF  - continue Brimonidine TID OS  - cont Cosopt TID OS  - cont Latanoprost QHS OS  - recommend IVA OS #1 today, 05.01.24, for worsening CME - pt wishes to proceed with injection - RBA of procedure discussed, questions answered - IVA informed consent obtained and signed, 05.01.24 (OS) - STK informed consent obtained and signed, 11.20.23 (OS) - see procedure note - f/u 4 weeks - DFE, OCT, possible injxn  3. Epiretinal membrane, left eye  - ERM with persistent cystic edema - OCT shows persistent ERM w/ cystic changes slightly increased - monitor for now, but discussed possibility of repeat PPV w/ membrane peel to remove ERM if vision fails to improve  4. Retinal hole OD  - operculated retinal hole located at 1030 with mild surrounding pigment, no SRF - s/p laser retinopexy OD (03.16.23) -- good laser surrounding - continue to monitor  5,6. Hypertensive retinopathy OU - discussed importance of tight BP control - continue to monitor  7. Pseudophakia OU  - s/p CE/IOL OS (Dr. Zenaida Niece, 10.04.23), CE with IOL OD (11.01.23, Dr. Zenaida Niece)  - IOL in good position, doing well  - continue to monitor  8. Ocular hypertension OS  - likely steroid response  - tmax 33 on 01.29.24  - s/p SLT OS w/ Dr. Wynelle Link on 12.05.23  - IOP today: 21  - cont brim and cosopt TID OS  - cont Latanoprost QHS OS (started 04.17.24)  Ophthalmic Meds Ordered this visit:  Meds ordered this encounter  Medications   Bevacizumab (AVASTIN) SOLN 1.25 mg     Return in about 4 weeks (around 11/12/2022) for f/u CME OS, DFE, OCT, possible injxn.  There are no Patient Instructions on file for this visit.  Explained the diagnoses, plan, and follow up with the patient and they expressed understanding.  Patient expressed understanding of the importance of proper follow up care.   This document serves as a record of services personally performed by Karie Chimera, MD, PhD. It was created on their  behalf by De Blanch, an ophthalmic technician. The creation of this record is the provider's dictation and/or activities during the visit.    Electronically signed by: De Blanch, OA, 10/15/22  5:17 PM  This document serves as a record of services personally performed by Karie Chimera, MD, PhD. It was created on their behalf by Glee Arvin. Manson Passey, OA an ophthalmic technician. The creation of this record is the provider's dictation and/or activities during the visit.    Electronically signed by: Glee Arvin. Manson Passey, [place] 05.01.2024 5:17 PM  Karie Chimera, M.D., Ph.D. Diseases & Surgery of the Retina and Vitreous [facility]  I have reviewed the above documentation for accuracy and completeness, and I agree with the above. Karie Chimera, M.D., Ph.D. 10/15/22 5:17 PM   Abbreviations: M myopia (nearsighted); A astigmatism; H hyperopia (farsighted); P presbyopia; Mrx spectacle prescription;  CTL contact lenses; OD right eye; OS left eye; OU both eyes  XT exotropia; ET esotropia; PEK punctate epithelial keratitis; PEE punctate epithelial erosions; DES dry eye syndrome; MGD meibomian gland dysfunction; ATs artificial tears; PFAT's preservative free artificial tears; NSC nuclear sclerotic cataract; PSC posterior subcapsular cataract; ERM epi-retinal membrane; PVD posterior vitreous detachment; RD retinal detachment; DM diabetes mellitus; DR diabetic retinopathy; NPDR non-proliferative diabetic retinopathy; PDR proliferative diabetic retinopathy; CSME clinically significant macular edema; DME diabetic macular edema; dbh dot blot hemorrhages; CWS cotton wool spot; POAG primary open angle glaucoma; C/D cup-to-disc ratio; HVF humphrey visual field; GVF goldmann visual field; OCT optical coherence tomography; IOP intraocular pressure; BRVO Branch retinal vein occlusion; CRVO central retinal vein occlusion; CRAO central retinal artery occlusion; BRAO branch retinal artery  occlusion; RT retinal tear; SB scleral buckle; PPV pars plana vitrectomy; VH Vitreous hemorrhage; PRP panretinal laser photocoagulation; IVK intravitreal kenalog; VMT vitreomacular traction; MH Macular hole;  NVD neovascularization of the disc; NVE neovascularization elsewhere; AREDS age related eye disease study; ARMD age related macular degeneration; POAG primary open angle glaucoma; EBMD epithelial/anterior basement membrane dystrophy; ACIOL anterior chamber intraocular lens; IOL intraocular lens; PCIOL posterior chamber intraocular lens; Phaco/IOL phacoemulsification with intraocular lens placement; PRK photorefractive keratectomy; LASIK laser assisted in situ keratomileusis; HTN hypertension; DM diabetes mellitus; COPD chronic obstructive pulmonary disease

## 2022-10-15 ENCOUNTER — Ambulatory Visit (INDEPENDENT_AMBULATORY_CARE_PROVIDER_SITE_OTHER): Payer: Medicare HMO | Admitting: Ophthalmology

## 2022-10-15 ENCOUNTER — Encounter (INDEPENDENT_AMBULATORY_CARE_PROVIDER_SITE_OTHER): Payer: Self-pay | Admitting: Ophthalmology

## 2022-10-15 DIAGNOSIS — H3322 Serous retinal detachment, left eye: Secondary | ICD-10-CM | POA: Diagnosis not present

## 2022-10-15 DIAGNOSIS — H35033 Hypertensive retinopathy, bilateral: Secondary | ICD-10-CM

## 2022-10-15 DIAGNOSIS — I10 Essential (primary) hypertension: Secondary | ICD-10-CM | POA: Diagnosis not present

## 2022-10-15 DIAGNOSIS — H40052 Ocular hypertension, left eye: Secondary | ICD-10-CM | POA: Diagnosis not present

## 2022-10-15 DIAGNOSIS — H33311 Horseshoe tear of retina without detachment, right eye: Secondary | ICD-10-CM | POA: Diagnosis not present

## 2022-10-15 DIAGNOSIS — H35372 Puckering of macula, left eye: Secondary | ICD-10-CM | POA: Diagnosis not present

## 2022-10-15 DIAGNOSIS — Z961 Presence of intraocular lens: Secondary | ICD-10-CM

## 2022-10-15 DIAGNOSIS — H35352 Cystoid macular degeneration, left eye: Secondary | ICD-10-CM | POA: Diagnosis not present

## 2022-10-15 MED ORDER — BEVACIZUMAB CHEMO INJECTION 1.25MG/0.05ML SYRINGE FOR KALEIDOSCOPE
1.2500 mg | INTRAVITREAL | Status: AC | PRN
  Administered 2022-10-15: 1.25 mg via INTRAVITREAL

## 2022-10-15 NOTE — Progress Notes (Signed)
[facility] - Clinic Note  10/15/2022    CHIEF COMPLAINT Patient presents for Retina Follow Up   HISTORY OF PRESENT ILLNESS: Alicia Miranda is a 67 y.o. female who presents to the clinic today for:   HPI     Retina Follow Up   Patient presents with  Other.  In left eye.  This started 2 weeks ago.  I, the attending physician,  performed the HPI with the patient and updated documentation appropriately.        Comments   Patient here for 2 weeks retina follow up for CME OS/IOP check. Patient states vision doing good. Trying to get better. No eye pain. Using drops.      Last edited by Rennis Chris, MD on 10/15/2022  5:13 PM.    Pt is using Cosopt and brimonidine TID OS, latanoprost QHS OS and Prolensa QID OS  Referring physician: Tally Joe, MD [phone] [address] [place],  [place] [address]  HISTORICAL INFORMATION:   Selected notes from the MEDICAL RECORD NUMBER Referred by Dr. Illene Labrador for possible RD OS LEE: 03/16/02023 Ocular Hx-  PMH- HTN    CURRENT MEDICATIONS: Current Outpatient Medications (Ophthalmic Drugs)  Medication Sig   brimonidine (ALPHAGAN) 0.2 % ophthalmic solution INSTILL 1 DROP INTO LEFT EYE THREE TIMES DAILY   Bromfenac Sodium (PROLENSA) 0.07 % SOLN Place 1 drop into the left eye in the morning, at noon, in the evening, and at bedtime. PT NEEDS 2 BOTTLES DISPENSED AT A TIME-CALL IF COUPON IS NEEDED   dorzolamide-timolol (COSOPT) 2-0.5 % ophthalmic solution Place 1 drop into the left eye in the morning, at noon, and at bedtime.   latanoprost (XALATAN) 0.005 % ophthalmic solution Place 1 drop into the left eye at bedtime.   LOTEPREDNOL ETABONATE OP Place 1 drop into the right eye 4 (four) times daily.   dorzolamide (TRUSOPT) 2 % ophthalmic solution Place 1 drop into the left eye 3 (three) times daily. (Patient not taking: Reported on 10/15/2022)   prednisoLONE acetate (PRED FORTE) 1 % ophthalmic suspension INSTILL 1 DROP INTO  LEFT EYE 4 TIMES DAILY (Patient not taking: Reported on 08/13/2022)   prednisoLONE acetate (PRED FORTE) 1 % ophthalmic suspension Place 1 drop into the left eye 4 (four) times daily. (Patient not taking: Reported on 08/13/2022)   No current facility-administered medications for this visit. (Ophthalmic Drugs)   Current Outpatient Medications (Other)  Medication Sig   APPLE CIDER VINEGAR PO Take 2 tablets by mouth daily. gummies   Cholecalciferol (VITAMIN D3) 10 MCG (400 UNIT) tablet Take 400 Units by mouth daily.   CINNAMON PO Take 1,000 mg by mouth every other day.   COLLAGEN PO Take 3 capsules by mouth daily.   Denosumab (PROLIA Sibley) Inject 60 mg into the skin every 6 (six) months.   FEROSUL 325 (65 Fe) MG tablet Take 325 mg by mouth daily.   fexofenadine (ALLEGRA) 180 MG tablet Take 180 mg by mouth daily.   fluticasone (FLONASE) 50 MCG/ACT nasal spray Place 1 spray into both nostrils daily.   furosemide (LASIX) 40 MG tablet Take 40 mg by mouth 2 (two) times daily.   Glucosamine HCl (GLUCOSAMINE PO) Take 2 tablets by mouth daily.   losartan (COZAAR) 50 MG tablet Take 100 mg by mouth daily.   Magnesium 400 MG CAPS Take 400 mg by mouth daily.   Melatonin 5 MG CAPS Take 10 mg by mouth at bedtime.   pantoprazole (PROTONIX) 40  MG tablet Take 40 mg by mouth 2 (two) times daily.   rosuvastatin (CRESTOR) 20 MG tablet Take 20 mg by mouth daily.   TURMERIC PO Take 2 tablets by mouth daily.   No current facility-administered medications for this visit. (Other)   REVIEW OF SYSTEMS: ROS   Positive for: Musculoskeletal, Cardiovascular, Eyes, Respiratory Negative for: Constitutional, Gastrointestinal, Neurological, Skin, Genitourinary, HENT, Endocrine, Psychiatric, Allergic/Imm, Heme/Lymph Last edited by Laddie Aquas, COA on 10/15/2022  1:10 PM.      ALLERGIES Allergies  Allergen Reactions   Codeine Itching   PAST MEDICAL HISTORY Past Medical History:  Diagnosis Date   Allergy     Arthritis    legs   Asthma    GERD (gastroesophageal reflux disease)    Hyperlipidemia    Hypertension    Past Surgical History:  Procedure Laterality Date   ABDOMINAL HYSTERECTOMY     CHOLECYSTECTOMY     COLONOSCOPY     GAS INSERTION Left 09/05/2021   Procedure: INSERTION OF GAS - C3F8;  Surgeon: Rennis Chris, MD;  Location: [facility] OR;  Service: Ophthalmology;  Laterality: Left;   GAS/FLUID EXCHANGE Left 09/05/2021   Procedure: GAS/FLUID EXCHANGE;  Surgeon: Rennis Chris, MD;  Location: [facility] OR;  Service: Ophthalmology;  Laterality: Left;   PARS PLANA VITRECTOMY Left 09/05/2021   Procedure: PARS PLANA VITRECTOMY WITH 25 GAUGE;  Surgeon: Rennis Chris, MD;  Location: [facility] OR;  Service: Ophthalmology;  Laterality: Left;   PERFLUORONE INJECTION Left 09/05/2021   Procedure: PERFLUORON INJECTION;  Surgeon: Rennis Chris, MD;  Location: [facility] OR;  Service: Ophthalmology;  Laterality: Left;   PHOTOCOAGULATION WITH LASER Left 09/05/2021   Procedure: PHOTOCOAGULATION WITH LASER;  Surgeon: Rennis Chris, MD;  Location: [facility] OR;  Service: Ophthalmology;  Laterality: Left;   SCLERAL BUCKLE Left 09/05/2021   Procedure: SCLERAL BUCKLE;  Surgeon: Rennis Chris, MD;  Location: [facility] OR;  Service: Ophthalmology;  Laterality: Left;   UPPER GASTROINTESTINAL ENDOSCOPY     FAMILY HISTORY Family History  Problem Relation Age of Onset   Retinal detachment Sister    Diabetes Sister    Glaucoma Brother    Diabetes Brother    Colon cancer Brother        dx at 15   Esophageal cancer Neg Hx    Rectal cancer Neg Hx    Stomach cancer Neg Hx    SOCIAL HISTORY Social History   Tobacco Use   Smoking status: Former   Smokeless tobacco: Never   Tobacco comments:    quit smoking in 2002 or 2003  Vaping Use   Vaping Use: Never used  Substance Use Topics   Alcohol use: Yes    Comment: very rarely   Drug use: No       OPHTHALMIC EXAM:  Base Eye Exam     Visual Acuity (Snellen - Linear)       Right Left   Dist [place]  20/20 -2 20/200 -2   Dist ph [place]  NI         Tonometry (Tonopen, 1:08 PM)       Right Left   Pressure 14 21         Pupils       Dark Light Shape React APD   Right 3 2 Round Brisk None   Left 3 2 Round Brisk None         Visual Fields (Counting fingers)       Left Right    Full Full  Extraocular Movement       Right Left    Full, Ortho Full, Ortho         Neuro/Psych     Oriented x3: Yes   Mood/Affect: Normal         Dilation     Both eyes: 1.0% Mydriacyl, 2.5% Phenylephrine @ 1:08 PM           Slit Lamp and Fundus Exam     External Exam       Right Left   External  Periorbital edema -- improved         Slit Lamp Exam       Right Left   Lids/Lashes Dermatochalasis - upper lid Dermatochalasis - upper lid   Conjunctiva/Sclera white and quiet White and quiet; sub conj heme, STK ST quad   Cornea trace PEE, mild tear film debris, well healed cataract wound 2+PEE, mild tear film debris, well healed cataract wound, inferior paracentral corneal haze   Anterior Chamber deep and clear deep and clear   Iris round and dilated round and dilated   Lens PC IOL in good position PC IOL in good position   Anterior Vitreous syneresis, asteroid hyalosis, PVD post vitrectomy         Fundus Exam       Right Left   Disc Pink and Sharp mild Pallor, Sharp rim, Compact   C/D Ratio 0.4 0.3   Macula flat, good foveal reflex, mild RPE mottling, No heme or edema Flat, Blunted foveal reflex, RPE mottling, ERM with striae greatest nasal mac, cystic changes centrally and nasally - persistent/slightly increased, No heme   Vessels attenuated, Tortuous attenuated, Tortuous   Periphery Attached, operculated hole @ 1030 with mild surrounding pigment -- good laser surrounding, no SRF, no new RT/RD Trace pockets of SRF superior to disc with fibrosis, Retina attached over buckle, good buckle height, good laser over buckle and around tears, PRE-OP: small tear with  fibrosis @ 0200, bullous, temporal RD from 1230 to 0430           Refraction     Wearing Rx       Sphere Cylinder Axis Add   Right +1.00 +1.00 130 +2.75   Left +1.25 +0.75 017 +2.75           IMAGING AND PROCEDURES  Imaging and Procedures for 10/15/2022  OCT, Retina - OU - Both Eyes       Right Eye Quality was good. Central Foveal Thickness: 274. Progression has been stable. Findings include normal foveal contour, no IRF, no SRF.   Left Eye Quality was good. Central Foveal Thickness: 598. Progression has worsened. Findings include no SRF, abnormal foveal contour, epiretinal membrane, intraretinal fluid, macular pucker (Retina stably re-attached, ERM with blunted foveal contour and mild pucker, mild interval increase in cystic changes/diffuse edema, focal pockets of SRF superior to disc -- persistent ).   Notes *Images captured and stored on drive  Diagnosis / Impression:  OD: NFP; no IRF/SRF [id number] stably re-attached, ERM with blunted foveal contour and mild pucker, mild interval increase in cystic changes/diffuse edema, focal pockets of SRF superior to disc -- persistent   Clinical management:  See below  Abbreviations: NFP - Normal foveal profile. CME - cystoid macular edema. PED - pigment epithelial detachment. IRF - intraretinal fluid. SRF - subretinal fluid. EZ - ellipsoid zone. ERM - epiretinal membrane. ORA - outer retinal atrophy. ORT - outer retinal tubulation. SRHM - subretinal hyper-reflective material. IRHM - intraretinal  hyper-reflective material      Intravitreal Injection, Pharmacologic Agent - OS - Left Eye       Time Out 10/15/2022. 1:39 PM. Confirmed correct patient, procedure, site, and patient consented.   Anesthesia Topical anesthesia was used. Anesthetic medications included Lidocaine 2%, Proparacaine 0.5%.   Procedure Preparation included 5% betadine to ocular surface, eyelid speculum. A supplied needle was used.   Injection: 1.25 mg  Bevacizumab 1.25mg /0.64ml   Route: Intravitreal, Site: Left Eye   NDC: [id number], Lot: [id number] , Expiration date: 11/14/2022   Post-op Post injection exam found visual acuity of at least counting fingers. The patient tolerated the procedure well. There were no complications. The patient received written and verbal post procedure care education.            ASSESSMENT/PLAN:    ICD-10-CM   1. Left retinal detachment  H33.22     2. Cystoid macular edema of left eye  H35.352 OCT, Retina - OU - Both Eyes    Intravitreal Injection, Pharmacologic Agent - OS - Left Eye    Bevacizumab (AVASTIN) SOLN 1.25 mg    3. Epiretinal membrane (ERM) of left eye  H35.372     4. Retinal tear of right eye  H33.311     5. Essential hypertension  I10     6. Hypertensive retinopathy of both eyes  H35.033 Intravitreal Injection, Pharmacologic Agent - OS - Left Eye    Bevacizumab (AVASTIN) SOLN 1.25 mg    7. 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 Rhegmatogenous retinal detachment with retinal hole, left eye - bullous, temporal, mac off detachment, onset of foveal involvement Tuesday, 03.14.23 by history - detached from 1230 to 430 oclock, fovea off, small tear at 0200 - s/p SBP + PPV/PFO/EL/FAX/14% C3F8 OS, 03.23.2023 - retina attached and in good position -- good buckle height and laser around breaks  - BCVA OS 20/200 -- decreased - OCT shows [id number] stably re-attached, ERM with blunted foveal contour and mild pucker, mild interval increase in cystic changes nasal macula and fovea, focal pockets of SRF superior to disc -- persistent  - FA 12.18.23 shows OD Focal staining ST periphery corresponding to laser retinopexy, OS Mild Hyperfluorescence of disc, mild perifoveal staining SN to fovea--?petaloid -- suggestive of CME - s/p STK #1 (11.20.23) for CME, #2 (02.28.24) - IOP OS 21 today -- h/o steroid response             - continue Prolensa QID  OS for CME  - continue holding PF  - continue Brimonidine TID OS  - cont Cosopt TID OS  - cont Latanoprost QHS OS  - recommend IVA OS #1 today, 05.01.24, for worsening CME - pt wishes to proceed with injection - RBA of procedure discussed, questions answered - IVA informed consent obtained and signed, 05.01.24 (OS) - STK informed consent obtained and signed, 11.20.23 (OS) - see procedure note - f/u 4 weeks - DFE, OCT, possible injxn  3. Epiretinal membrane, left eye  - ERM with persistent cystic edema - OCT shows persistent ERM w/ cystic changes slightly increased - monitor for now, but discussed possibility of repeat PPV w/ membrane peel to remove ERM if vision fails to improve  4. Retinal hole OD  - operculated retinal hole located at 1030 with mild surrounding pigment, no SRF - s/p laser retinopexy OD (03.16.23) -- good laser surrounding - continue to monitor  5,6. Hypertensive retinopathy OU - discussed importance of tight BP control - continue to monitor  7. Pseudophakia OU  - s/p CE/IOL OS (Dr. Zenaida Niece, 10.04.23), CE with IOL OD (11.01.23, Dr. Zenaida Niece)  - IOL in good position, doing well  - continue to monitor  8. Ocular hypertension OS  - likely steroid response  - tmax 33 on 01.29.24  - s/p SLT OS w/ Dr. Wynelle Link on 12.05.23  - IOP today: 21  - cont brim and cosopt TID OS  - cont Latanoprost QHS OS (started 04.17.24)  Ophthalmic Meds Ordered this visit:  Meds ordered this encounter  Medications   Bevacizumab (AVASTIN) SOLN 1.25 mg     Return in about 4 weeks (around 11/12/2022) for f/u CME OS, DFE, OCT, possible injxn.  There are no Patient Instructions on file for this visit.  Explained the diagnoses, plan, and follow up with the patient and they expressed understanding.  Patient expressed understanding of the importance of proper follow up care.   This document serves as a record of services personally performed by Karie Chimera, MD, PhD. It was created on their  behalf by De Blanch, an ophthalmic technician. The creation of this record is the provider's dictation and/or activities during the visit.    Electronically signed by: De Blanch, OA, 10/15/22  5:17 PM  This document serves as a record of services personally performed by Karie Chimera, MD, PhD. It was created on their behalf by Glee Arvin. Manson Passey, OA an ophthalmic technician. The creation of this record is the provider's dictation and/or activities during the visit.    Electronically signed by: Glee Arvin. Manson Passey, [place] 05.01.2024 5:17 PM  Karie Chimera, M.D., Ph.D. Diseases & Surgery of the Retina and Vitreous [facility]  I have reviewed the above documentation for accuracy and completeness, and I agree with the above. Karie Chimera, M.D., Ph.D. 10/15/22 5:17 PM   Abbreviations: M myopia (nearsighted); A astigmatism; H hyperopia (farsighted); P presbyopia; Mrx spectacle prescription;  CTL contact lenses; OD right eye; OS left eye; OU both eyes  XT exotropia; ET esotropia; PEK punctate epithelial keratitis; PEE punctate epithelial erosions; DES dry eye syndrome; MGD meibomian gland dysfunction; ATs artificial tears; PFAT's preservative free artificial tears; NSC nuclear sclerotic cataract; PSC posterior subcapsular cataract; ERM epi-retinal membrane; PVD posterior vitreous detachment; RD retinal detachment; DM diabetes mellitus; DR diabetic retinopathy; NPDR non-proliferative diabetic retinopathy; PDR proliferative diabetic retinopathy; CSME clinically significant macular edema; DME diabetic macular edema; dbh dot blot hemorrhages; CWS cotton wool spot; POAG primary open angle glaucoma; C/D cup-to-disc ratio; HVF humphrey visual field; GVF goldmann visual field; OCT optical coherence tomography; IOP intraocular pressure; BRVO Branch retinal vein occlusion; CRVO central retinal vein occlusion; CRAO central retinal artery occlusion; BRAO branch retinal artery  occlusion; RT retinal tear; SB scleral buckle; PPV pars plana vitrectomy; VH Vitreous hemorrhage; PRP panretinal laser photocoagulation; IVK intravitreal kenalog; VMT vitreomacular traction; MH Macular hole;  NVD neovascularization of the disc; NVE neovascularization elsewhere; AREDS age related eye disease study; ARMD age related macular degeneration; POAG primary open angle glaucoma; EBMD epithelial/anterior basement membrane dystrophy; ACIOL anterior chamber intraocular lens; IOL intraocular lens; PCIOL posterior chamber intraocular lens; Phaco/IOL phacoemulsification with intraocular lens placement; PRK photorefractive keratectomy; LASIK laser assisted in situ keratomileusis; HTN hypertension; DM diabetes mellitus; COPD chronic obstructive pulmonary disease

## 2022-11-05 NOTE — Progress Notes (Signed)
[facility] - Clinic Note  11/12/2022    CHIEF COMPLAINT Patient presents for Retina Follow Up   HISTORY OF PRESENT ILLNESS: Alicia Miranda is a 67 y.o. female who presents to the clinic today for:   HPI     Retina Follow Up   Patient presents with  Other.  In left eye.  This started 4 weeks ago.  I, the attending physician,  performed the HPI with the patient and updated documentation appropriately.        Comments   Patient here for 4 weeks retina follow up for CME OS. Patient states vision about the same. No eye pain.       Last edited by Rennis Chris, MD on 11/12/2022  4:34 PM.     Patient states that she could see a little better after the last injection.   Referring physician: Tally Joe, MD [phone] [address] [place],  [place] [address]  HISTORICAL INFORMATION:   Selected notes from the MEDICAL RECORD NUMBER Referred by Dr. Illene Labrador for possible RD OS LEE: 03/16/02023 Ocular Hx-  PMH- HTN    CURRENT MEDICATIONS: Current Outpatient Medications (Ophthalmic Drugs)  Medication Sig   brimonidine (ALPHAGAN) 0.2 % ophthalmic solution INSTILL 1 DROP INTO LEFT EYE THREE TIMES DAILY   Bromfenac Sodium (PROLENSA) 0.07 % SOLN Place 1 drop into the left eye in the morning, at noon, in the evening, and at bedtime. PT NEEDS 2 BOTTLES DISPENSED AT A TIME-CALL IF COUPON IS NEEDED   dorzolamide-timolol (COSOPT) 2-0.5 % ophthalmic solution Place 1 drop into the left eye in the morning, at noon, and at bedtime.   latanoprost (XALATAN) 0.005 % ophthalmic solution Place 1 drop into the left eye at bedtime.   LOTEPREDNOL ETABONATE OP Place 1 drop into the right eye 4 (four) times daily.   dorzolamide (TRUSOPT) 2 % ophthalmic solution Place 1 drop into the left eye 3 (three) times daily. (Patient not taking: Reported on 10/15/2022)   prednisoLONE acetate (PRED FORTE) 1 % ophthalmic suspension INSTILL 1 DROP INTO LEFT EYE 4 TIMES DAILY (Patient not taking:  Reported on 08/13/2022)   prednisoLONE acetate (PRED FORTE) 1 % ophthalmic suspension Place 1 drop into the left eye 4 (four) times daily. (Patient not taking: Reported on 08/13/2022)   No current facility-administered medications for this visit. (Ophthalmic Drugs)   Current Outpatient Medications (Other)  Medication Sig   APPLE CIDER VINEGAR PO Take 2 tablets by mouth daily. gummies   Cholecalciferol (VITAMIN D3) 10 MCG (400 UNIT) tablet Take 400 Units by mouth daily.   CINNAMON PO Take 1,000 mg by mouth every other day.   COLLAGEN PO Take 3 capsules by mouth daily.   Denosumab (PROLIA [place]) Inject 60 mg into the skin every 6 (six) months.   FEROSUL 325 (65 Fe) MG tablet Take 325 mg by mouth daily.   fexofenadine (ALLEGRA) 180 MG tablet Take 180 mg by mouth daily.   fluticasone (FLONASE) 50 MCG/ACT nasal spray Place 1 spray into both nostrils daily.   furosemide (LASIX) 40 MG tablet Take 40 mg by mouth 2 (two) times daily.   Glucosamine HCl (GLUCOSAMINE PO) Take 2 tablets by mouth daily.   losartan (COZAAR) 50 MG tablet Take 100 mg by mouth daily.   Magnesium 400 MG CAPS Take 400 mg by mouth daily.   Melatonin 5 MG CAPS Take 10 mg by mouth at bedtime.   pantoprazole (PROTONIX) 40 MG tablet Take 40 mg  by mouth 2 (two) times daily.   rosuvastatin (CRESTOR) 20 MG tablet Take 20 mg by mouth daily.   TURMERIC PO Take 2 tablets by mouth daily.   No current facility-administered medications for this visit. (Other)   REVIEW OF SYSTEMS: ROS   Positive for: Musculoskeletal, Cardiovascular, Eyes, Respiratory Negative for: Constitutional, Gastrointestinal, Neurological, Skin, Genitourinary, HENT, Endocrine, Psychiatric, Allergic/Imm, Heme/Lymph Last edited by Laddie Aquas, COA on 11/12/2022  1:41 PM.       ALLERGIES Allergies  Allergen Reactions   Codeine Itching   PAST MEDICAL HISTORY Past Medical History:  Diagnosis Date   Allergy    Arthritis    legs   Asthma    GERD  (gastroesophageal reflux disease)    Hyperlipidemia    Hypertension    Past Surgical History:  Procedure Laterality Date   ABDOMINAL HYSTERECTOMY     CHOLECYSTECTOMY     COLONOSCOPY     GAS INSERTION Left 09/05/2021   Procedure: INSERTION OF GAS - C3F8;  Surgeon: Rennis Chris, MD;  Location: [facility] OR;  Service: Ophthalmology;  Laterality: Left;   GAS/FLUID EXCHANGE Left 09/05/2021   Procedure: GAS/FLUID EXCHANGE;  Surgeon: Rennis Chris, MD;  Location: [facility] OR;  Service: Ophthalmology;  Laterality: Left;   PARS PLANA VITRECTOMY Left 09/05/2021   Procedure: PARS PLANA VITRECTOMY WITH 25 GAUGE;  Surgeon: Rennis Chris, MD;  Location: [facility] OR;  Service: Ophthalmology;  Laterality: Left;   PERFLUORONE INJECTION Left 09/05/2021   Procedure: PERFLUORON INJECTION;  Surgeon: Rennis Chris, MD;  Location: [facility] OR;  Service: Ophthalmology;  Laterality: Left;   PHOTOCOAGULATION WITH LASER Left 09/05/2021   Procedure: PHOTOCOAGULATION WITH LASER;  Surgeon: Rennis Chris, MD;  Location: [facility] OR;  Service: Ophthalmology;  Laterality: Left;   SCLERAL BUCKLE Left 09/05/2021   Procedure: SCLERAL BUCKLE;  Surgeon: Rennis Chris, MD;  Location: [facility] OR;  Service: Ophthalmology;  Laterality: Left;   UPPER GASTROINTESTINAL ENDOSCOPY     FAMILY HISTORY Family History  Problem Relation Age of Onset   Retinal detachment Sister    Diabetes Sister    Glaucoma Brother    Diabetes Brother    Colon cancer Brother        dx at 19   Esophageal cancer Neg Hx    Rectal cancer Neg Hx    Stomach cancer Neg Hx    SOCIAL HISTORY Social History   Tobacco Use   Smoking status: Former   Smokeless tobacco: Never   Tobacco comments:    quit smoking in 2002 or 2003  Vaping Use   Vaping Use: Never used  Substance Use Topics   Alcohol use: Yes    Comment: very rarely   Drug use: No       OPHTHALMIC EXAM:  Base Eye Exam     Visual Acuity (Snellen - Linear)       Right Left   Dist [place] 20/20 20/200 -2          Tonometry (Tonopen, 1:39 PM)       Right Left   Pressure 17 19         Pupils       Dark Light Shape React APD   Right 3 2 Round Brisk None   Left 3 2 Round Brisk None         Visual Fields (Counting fingers)       Left Right    Full Full         Extraocular Movement  Right Left    Full, Ortho Full, Ortho         Neuro/Psych     Oriented x3: Yes   Mood/Affect: Normal         Dilation     Both eyes: 1.0% Mydriacyl, 2.5% Phenylephrine @ 1:39 PM           Slit Lamp and Fundus Exam     External Exam       Right Left   External  Periorbital edema -- improved         Slit Lamp Exam       Right Left   Lids/Lashes Dermatochalasis - upper lid Dermatochalasis - upper lid   Conjunctiva/Sclera white and quiet White and quiet; sub conj heme, STK ST quad   Cornea trace PEE, mild tear film debris, well healed cataract wound 2+PEE, mild tear film debris, well healed cataract wound, inferior paracentral corneal haze   Anterior Chamber deep and clear deep and clear   Iris round and dilated round and dilated   Lens PC IOL in good position PC IOL in good position   Anterior Vitreous syneresis, asteroid hyalosis, PVD post vitrectomy         Fundus Exam       Right Left   Disc Pink and Sharp mild Pallor, Sharp rim, Compact   C/D Ratio 0.4 0.3   Macula flat, good foveal reflex, mild RPE mottling, No heme or edema Flat, Blunted foveal reflex, RPE mottling, ERM with striae greatest nasal mac, cystic changes centrally and nasally - persistent/slightly increased, No heme   Vessels attenuated, Tortuous attenuated, Tortuous   Periphery Attached, operculated hole @ 1030 with mild surrounding pigment -- good laser surrounding, no SRF, no new RT/RD Trace pockets of SRF superior to disc with fibrosis, Retina attached over buckle, good buckle height, good laser over buckle and around tears, PRE-OP: small tear with fibrosis @ 0200, bullous, temporal RD from 1230 to  0430           Refraction     Wearing Rx       Sphere Cylinder Axis Add   Right +1.00 +1.00 130 +2.75   Left +1.25 +0.75 017 +2.75           IMAGING AND PROCEDURES  Imaging and Procedures for 11/12/2022  OCT, Retina - OU - Both Eyes       Right Eye Quality was good. Central Foveal Thickness: 276. Progression has been stable. Findings include normal foveal contour, no IRF, no SRF.   Left Eye Quality was good. Central Foveal Thickness: 636. Progression has been stable. Findings include no SRF, abnormal foveal contour, epiretinal membrane, intraretinal fluid, macular pucker (Retina stably re-attached, ERM with blunted foveal contour and mild pucker, persistent cystic changes/diffuse edema, focal pockets of SRF superior to disc --persistent ).   Notes *Images captured and stored on drive  Diagnosis / Impression:  OD: NFP; no IRF/SRF [id number] stably re-attached, ERM with blunted foveal contour and mild pucker, persistent cystic changes/diffuse edema, focal pockets of SRF superior to disc --persistent   Clinical management:  See below  Abbreviations: NFP - Normal foveal profile. CME - cystoid macular edema. PED - pigment epithelial detachment. IRF - intraretinal fluid. SRF - subretinal fluid. EZ - ellipsoid zone. ERM - epiretinal membrane. ORA - outer retinal atrophy. ORT - outer retinal tubulation. SRHM - subretinal hyper-reflective material. IRHM - intraretinal hyper-reflective material      Intravitreal Injection, Pharmacologic Agent - OS - Left  Eye       Time Out 11/12/2022. 2:40 PM. Confirmed correct patient, procedure, site, and patient consented.   Anesthesia Topical anesthesia was used. Anesthetic medications included Lidocaine 2%, Proparacaine 0.5%.   Procedure Preparation included 5% betadine to ocular surface, eyelid speculum. A (32g) needle was used.   Injection: 1.25 mg Bevacizumab 1.25mg /0.82ml   Route: Intravitreal, Site: Left Eye   NDC:  [id number], Lot: [id number], Expiration date: 02/14/2023   Post-op Post injection exam found visual acuity of at least counting fingers. The patient tolerated the procedure well. There were no complications. The patient received written and verbal post procedure care education.            ASSESSMENT/PLAN:    ICD-10-CM   1. Left retinal detachment  H33.22     2. Cystoid macular edema of left eye  H35.352 OCT, Retina - OU - Both Eyes    Intravitreal Injection, Pharmacologic Agent - OS - Left Eye    Bevacizumab (AVASTIN) SOLN 1.25 mg    3. Epiretinal membrane (ERM) of left eye  H35.372     4. Retinal tear of right eye  H33.311     5. Essential hypertension  I10     6. Hypertensive retinopathy of both eyes  H35.033     7. 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 Rhegmatogenous retinal detachment with retinal hole, left eye - bullous, temporal, mac off detachment, onset of foveal involvement Tuesday, 03.14.23 by history - detached from 1230 to 430 oclock, fovea off, small tear at 0200 - s/p SBP + PPV/PFO/EL/FAX/14% C3F8 OS, 03.23.2023 - retina attached and in good position -- good buckle height and laser around breaks  - BCVA OS 20/200 -- stable - OCT shows OS: Retina stably re-attached, ERM with blunted foveal contour and mild pucker, persistent cystic changes/diffuse edema, focal pockets of SRF superior to disc --persistent  - FA 12.18.23 shows OD Focal staining ST periphery corresponding to laser retinopexy, OS Mild Hyperfluorescence of disc, mild perifoveal staining SN to fovea--?petaloid -- suggestive of CME - s/p STK #1 (11.20.23) for CME, #2 (02.28.24) - s/p IVA OS #1 (05.01.24) - IOP OS 19 today -- h/o steroid response             - continue Prolensa QID OS for CME  - continue holding PF  - continue Brimonidine TID OS, Cosopt TID OS, and Latanoprost QHS OS  - recommend IVA OS #2 today, 05.29.24, for persistent CME - pt  wishes to proceed with injection - RBA of procedure discussed, questions answered - IVA informed consent obtained and signed, 05.01.24 (OS) - STK informed consent obtained and signed, 11.20.23 (OS) - see procedure note - f/u 4 weeks - DFE, OCT, possible injxn  3. Epiretinal membrane, left eye  - ERM with persistent cystic edema - OCT shows persistent ERM w/ cystic changes slightly increased - monitor for now, but discussed possibility of repeat PPV w/ membrane peel to remove ERM if vision fails to improve  4. Retinal hole OD  - operculated retinal hole located at 1030 with mild surrounding pigment, no SRF - s/p laser retinopexy OD (03.16.23) -- good laser surrounding - continue to monitor  5,6. Hypertensive retinopathy OU - discussed importance of tight BP control - continue to monitor  7. Pseudophakia OU  - s/p CE/IOL OS (Dr. Zenaida Niece, 10.04.23), CE with IOL OD (11.01.23, Dr. Zenaida Niece)  -  IOL in good position, doing well  - continue to monitor  8. Ocular hypertension OS  - likely steroid response  - tmax 33 on 01.29.24  - s/p SLT OS w/ Dr. Wynelle Link on 12.05.23  - IOP today: 19  - cont brim and cosopt TID OS  - cont Latanoprost QHS OS (started 04.17.24)  Ophthalmic Meds Ordered this visit:  Meds ordered this encounter  Medications   Bevacizumab (AVASTIN) SOLN 1.25 mg     Return in about 4 weeks (around 12/10/2022) for F/u CME OS, DFE, OCT, Possible, IVA, OS.  There are no Patient Instructions on file for this visit.  Explained the diagnoses, plan, and follow up with the patient and they expressed understanding.  Patient expressed understanding of the importance of proper follow up care.   This document serves as a record of services personally performed by Karie Chimera, MD, PhD. It was created on their behalf by De Blanch, an ophthalmic technician. The creation of this record is the provider's dictation and/or activities during the visit.    Electronically signed by:  De Blanch, OA, 11/12/22  4:35 PM  This document serves as a record of services personally performed by Karie Chimera, MD, PhD. It was created on their behalf by Gerilyn Nestle, COT an ophthalmic technician. The creation of this record is the provider's dictation and/or activities during the visit.    Electronically signed by:  Gerilyn Nestle, COT  5.29.24 4:35 PM  Karie Chimera, M.D., Ph.D. Diseases & Surgery of the Retina and Vitreous [facility]  I have reviewed the above documentation for accuracy and completeness, and I agree with the above. Karie Chimera, M.D., Ph.D. 11/12/22 4:36 PM   Abbreviations: M myopia (nearsighted); A astigmatism; H hyperopia (farsighted); P presbyopia; Mrx spectacle prescription;  CTL contact lenses; OD right eye; OS left eye; OU both eyes  XT exotropia; ET esotropia; PEK punctate epithelial keratitis; PEE punctate epithelial erosions; DES dry eye syndrome; MGD meibomian gland dysfunction; ATs artificial tears; PFAT's preservative free artificial tears; NSC nuclear sclerotic cataract; PSC posterior subcapsular cataract; ERM epi-retinal membrane; PVD posterior vitreous detachment; RD retinal detachment; DM diabetes mellitus; DR diabetic retinopathy; NPDR non-proliferative diabetic retinopathy; PDR proliferative diabetic retinopathy; CSME clinically significant macular edema; DME diabetic macular edema; dbh dot blot hemorrhages; CWS cotton wool spot; POAG primary open angle glaucoma; C/D cup-to-disc ratio; HVF humphrey visual field; GVF goldmann visual field; OCT optical coherence tomography; IOP intraocular pressure; BRVO Branch retinal vein occlusion; CRVO central retinal vein occlusion; CRAO central retinal artery occlusion; BRAO branch retinal artery occlusion; RT retinal tear; SB scleral buckle; PPV pars plana vitrectomy; VH Vitreous hemorrhage; PRP panretinal laser photocoagulation; IVK intravitreal kenalog; VMT  vitreomacular traction; MH Macular hole;  NVD neovascularization of the disc; NVE neovascularization elsewhere; AREDS age related eye disease study; ARMD age related macular degeneration; POAG primary open angle glaucoma; EBMD epithelial/anterior basement membrane dystrophy; ACIOL anterior chamber intraocular lens; IOL intraocular lens; PCIOL posterior chamber intraocular lens; Phaco/IOL phacoemulsification with intraocular lens placement; PRK photorefractive keratectomy; LASIK laser assisted in situ keratomileusis; HTN hypertension; DM diabetes mellitus; COPD chronic obstructive pulmonary disease

## 2022-11-12 ENCOUNTER — Encounter (INDEPENDENT_AMBULATORY_CARE_PROVIDER_SITE_OTHER): Payer: Self-pay | Admitting: Ophthalmology

## 2022-11-12 ENCOUNTER — Ambulatory Visit (INDEPENDENT_AMBULATORY_CARE_PROVIDER_SITE_OTHER): Payer: Medicare HMO | Admitting: Ophthalmology

## 2022-11-12 DIAGNOSIS — H35372 Puckering of macula, left eye: Secondary | ICD-10-CM | POA: Diagnosis not present

## 2022-11-12 DIAGNOSIS — Z961 Presence of intraocular lens: Secondary | ICD-10-CM

## 2022-11-12 DIAGNOSIS — H35033 Hypertensive retinopathy, bilateral: Secondary | ICD-10-CM | POA: Diagnosis not present

## 2022-11-12 DIAGNOSIS — H35352 Cystoid macular degeneration, left eye: Secondary | ICD-10-CM | POA: Diagnosis not present

## 2022-11-12 DIAGNOSIS — H3322 Serous retinal detachment, left eye: Secondary | ICD-10-CM | POA: Diagnosis not present

## 2022-11-12 DIAGNOSIS — H33311 Horseshoe tear of retina without detachment, right eye: Secondary | ICD-10-CM

## 2022-11-12 DIAGNOSIS — I10 Essential (primary) hypertension: Secondary | ICD-10-CM

## 2022-11-12 DIAGNOSIS — H40052 Ocular hypertension, left eye: Secondary | ICD-10-CM | POA: Diagnosis not present

## 2022-11-12 MED ORDER — BEVACIZUMAB CHEMO INJECTION 1.25MG/0.05ML SYRINGE FOR KALEIDOSCOPE
1.2500 mg | INTRAVITREAL | Status: AC | PRN
  Administered 2022-11-12: 1.25 mg via INTRAVITREAL

## 2022-12-08 NOTE — Progress Notes (Shared)
[facility] - Clinic Note  12/10/2022    CHIEF COMPLAINT Patient presents for Retina Follow Up   HISTORY OF PRESENT ILLNESS: Alicia Miranda is a 67 y.o. female who presents to the clinic today for:   HPI     Retina Follow Up   In left eye.  This started 4 weeks ago.  Duration of 4 weeks.  Since onset it is stable.  I, the attending physician,  performed the HPI with the patient and updated documentation appropriately.        Comments   4 week retina follow up CME and IVA OS pt is reporting no vision changes noticed she has had some irritation the last week with watering and itching she denies crusting or discharge       Last edited by Rennis Chris, MD on 12/10/2022  5:54 PM.    Pt states her left eye is "agitating" her, she states it doesn't hurt, but feels like something is in it and run all the time, she is using AT's   Referring physician: Tally Joe, MD [phone] [address] [place],  [place] [address]  HISTORICAL INFORMATION:   Selected notes from the MEDICAL RECORD NUMBER Referred by Dr. Illene Labrador for possible RD OS LEE: 03/16/02023 Ocular Hx-  PMH- HTN    CURRENT MEDICATIONS: Current Outpatient Medications (Ophthalmic Drugs)  Medication Sig   erythromycin ophthalmic ointment Place 1 Application into the left eye at bedtime.   brimonidine (ALPHAGAN) 0.2 % ophthalmic solution INSTILL 1 DROP INTO LEFT EYE THREE TIMES DAILY   Bromfenac Sodium (PROLENSA) 0.07 % SOLN Place 1 drop into the left eye in the morning, at noon, in the evening, and at bedtime. PT NEEDS 2 BOTTLES DISPENSED AT A TIME-CALL IF COUPON IS NEEDED   dorzolamide (TRUSOPT) 2 % ophthalmic solution Place 1 drop into the left eye 3 (three) times daily. (Patient not taking: Reported on 10/15/2022)   dorzolamide-timolol (COSOPT) 2-0.5 % ophthalmic solution Place 1 drop into the left eye in the morning, at noon, and at bedtime.   latanoprost (XALATAN) 0.005 % ophthalmic solution  Place 1 drop into the left eye at bedtime.   LOTEPREDNOL ETABONATE OP Place 1 drop into the right eye 4 (four) times daily.   prednisoLONE acetate (PRED FORTE) 1 % ophthalmic suspension INSTILL 1 DROP INTO LEFT EYE 4 TIMES DAILY (Patient not taking: Reported on 08/13/2022)   prednisoLONE acetate (PRED FORTE) 1 % ophthalmic suspension Place 1 drop into the left eye 4 (four) times daily. (Patient not taking: Reported on 08/13/2022)   No current facility-administered medications for this visit. (Ophthalmic Drugs)   Current Outpatient Medications (Other)  Medication Sig   APPLE CIDER VINEGAR PO Take 2 tablets by mouth daily. gummies   Cholecalciferol (VITAMIN D3) 10 MCG (400 UNIT) tablet Take 400 Units by mouth daily.   CINNAMON PO Take 1,000 mg by mouth every other day.   COLLAGEN PO Take 3 capsules by mouth daily.   Denosumab (PROLIA [place]) Inject 60 mg into the skin every 6 (six) months.   FEROSUL 325 (65 Fe) MG tablet Take 325 mg by mouth daily.   fexofenadine (ALLEGRA) 180 MG tablet Take 180 mg by mouth daily.   fluticasone (FLONASE) 50 MCG/ACT nasal spray Place 1 spray into both nostrils daily.   furosemide (LASIX) 40 MG tablet Take 40 mg by mouth 2 (two) times daily.   Glucosamine HCl (GLUCOSAMINE PO) Take 2 tablets by mouth daily.  losartan (COZAAR) 50 MG tablet Take 100 mg by mouth daily.   Magnesium 400 MG CAPS Take 400 mg by mouth daily.   Melatonin 5 MG CAPS Take 10 mg by mouth at bedtime.   pantoprazole (PROTONIX) 40 MG tablet Take 40 mg by mouth 2 (two) times daily.   rosuvastatin (CRESTOR) 20 MG tablet Take 20 mg by mouth daily.   TURMERIC PO Take 2 tablets by mouth daily.   No current facility-administered medications for this visit. (Other)   REVIEW OF SYSTEMS: ROS   Positive for: Musculoskeletal, Cardiovascular, Eyes, Respiratory Negative for: Constitutional, Gastrointestinal, Neurological, Skin, Genitourinary, HENT, Endocrine, Psychiatric, Allergic/Imm, Heme/Lymph Last  edited by Etheleen Mayhew, COT on 12/10/2022  3:03 PM.     ALLERGIES Allergies  Allergen Reactions   Codeine Itching   PAST MEDICAL HISTORY Past Medical History:  Diagnosis Date   Allergy    Arthritis    legs   Asthma    GERD (gastroesophageal reflux disease)    Hyperlipidemia    Hypertension    Past Surgical History:  Procedure Laterality Date   ABDOMINAL HYSTERECTOMY     CHOLECYSTECTOMY     COLONOSCOPY     GAS INSERTION Left 09/05/2021   Procedure: INSERTION OF GAS - C3F8;  Surgeon: Rennis Chris, MD;  Location: [facility] OR;  Service: Ophthalmology;  Laterality: Left;   GAS/FLUID EXCHANGE Left 09/05/2021   Procedure: GAS/FLUID EXCHANGE;  Surgeon: Rennis Chris, MD;  Location: [facility] OR;  Service: Ophthalmology;  Laterality: Left;   PARS PLANA VITRECTOMY Left 09/05/2021   Procedure: PARS PLANA VITRECTOMY WITH 25 GAUGE;  Surgeon: Rennis Chris, MD;  Location: [facility] OR;  Service: Ophthalmology;  Laterality: Left;   PERFLUORONE INJECTION Left 09/05/2021   Procedure: PERFLUORON INJECTION;  Surgeon: Rennis Chris, MD;  Location: [facility] OR;  Service: Ophthalmology;  Laterality: Left;   PHOTOCOAGULATION WITH LASER Left 09/05/2021   Procedure: PHOTOCOAGULATION WITH LASER;  Surgeon: Rennis Chris, MD;  Location: [facility] OR;  Service: Ophthalmology;  Laterality: Left;   SCLERAL BUCKLE Left 09/05/2021   Procedure: SCLERAL BUCKLE;  Surgeon: Rennis Chris, MD;  Location: [facility] OR;  Service: Ophthalmology;  Laterality: Left;   UPPER GASTROINTESTINAL ENDOSCOPY     FAMILY HISTORY Family History  Problem Relation Age of Onset   Retinal detachment Sister    Diabetes Sister    Glaucoma Brother    Diabetes Brother    Colon cancer Brother        dx at 62   Esophageal cancer Neg Hx    Rectal cancer Neg Hx    Stomach cancer Neg Hx    SOCIAL HISTORY Social History   Tobacco Use   Smoking status: Former   Smokeless tobacco: Never   Tobacco comments:    quit smoking in 2002 or 2003  Vaping Use   Vaping  Use: Never used  Substance Use Topics   Alcohol use: Yes    Comment: very rarely   Drug use: No       OPHTHALMIC EXAM:  Base Eye Exam     Visual Acuity (Snellen - Linear)       Right Left   Dist [place] 20/25 -2 20/300   Dist ph [place] NI NI         Tonometry (Tonopen, 3:07 PM)       Right Left   Pressure 18 19         Pupils       Pupils Dark Light Shape React APD   Right  PERRL 3 2 Round Brisk None   Left PERRL 3 2 Round Brisk None         Visual Fields       Left Right    Full Full         Extraocular Movement       Right Left    Full, Ortho Full, Ortho         Neuro/Psych     Oriented x3: Yes   Mood/Affect: Normal         Dilation     Both eyes: 2.5% Phenylephrine @ 3:07 PM           Slit Lamp and Fundus Exam     External Exam       Right Left   External  Periorbital edema -- improved         Slit Lamp Exam       Right Left   Lids/Lashes Dermatochalasis - upper lid Dermatochalasis - upper lid, mild lid erythema   Conjunctiva/Sclera white and quiet White and quiet   Cornea trace PEE, mild tear film debris, well healed cataract wound 3+PEE, mild tear film debris, well healed cataract wound, inferior paracentral corneal haze   Anterior Chamber deep and clear deep and clear   Iris round and dilated round and dilated   Lens PC IOL in good position PC IOL in good position   Anterior Vitreous syneresis, asteroid hyalosis, PVD post vitrectomy         Fundus Exam       Right Left   Disc Pink and Sharp mild Pallor, Sharp rim, Compact   C/D Ratio 0.4 0.3   Macula flat, good foveal reflex, mild RPE mottling, No heme or edema Flat, Blunted foveal reflex, RPE mottling, ERM with striae greatest nasal mac, cystic changes centrally and nasally - persistent/slightly increased, No heme   Vessels attenuated, Tortuous attenuated, Tortuous   Periphery Attached, operculated hole @ 1030 with mild surrounding pigment -- good laser surrounding, no  SRF, no new RT/RD Trace pockets of SRF superior to disc with fibrosis, Retina attached over buckle, good buckle height, good laser over buckle and around tears, PRE-OP: small tear with fibrosis @ 0200, bullous, temporal RD from 1230 to 0430           Refraction     Wearing Rx       Sphere Cylinder Axis Add   Right +1.00 +1.00 130 +2.75   Left +1.25 +0.75 017 +2.75           IMAGING AND PROCEDURES  Imaging and Procedures for 12/10/2022  OCT, Retina - OU - Both Eyes       Right Eye Quality was good. Central Foveal Thickness: 274. Progression has been stable. Findings include normal foveal contour, no IRF, no SRF.   Left Eye Quality was good. Central Foveal Thickness: 693. Progression has worsened. Findings include abnormal foveal contour, epiretinal membrane, intraretinal fluid, macular pucker, subretinal fluid (Retina stably re-attached, ERM with blunted foveal contour and mild pucker, persistent cystic changes/diffuse edema, interval development of central SRF).   Notes *Images captured and stored on drive  Diagnosis / Impression:  OD: NFP; no IRF/SRF [id number] stably re-attached, ERM with blunted foveal contour and mild pucker, persistent cystic changes/diffuse edema, interval development of central SRF  Clinical management:  See below  Abbreviations: NFP - Normal foveal profile. CME - cystoid macular edema. PED - pigment epithelial detachment. IRF - intraretinal fluid. SRF - subretinal fluid. EZ -  ellipsoid zone. ERM - epiretinal membrane. ORA - outer retinal atrophy. ORT - outer retinal tubulation. SRHM - subretinal hyper-reflective material. IRHM - intraretinal hyper-reflective material      Intravitreal Injection, Pharmacologic Agent - OS - Left Eye       Time Out 12/10/2022. 4:13 PM. Confirmed correct patient, procedure, site, and patient consented.   Anesthesia Topical anesthesia was used. Anesthetic medications included Lidocaine 2%, Proparacaine 0.5%.    Procedure Preparation included 5% betadine to ocular surface, eyelid speculum. A (32g) needle was used.   Injection: 1.25 mg Bevacizumab 1.25mg /0.6ml   Route: Intravitreal, Site: Left Eye   NDC: [id number], Lot: [id number], Expiration date: 03/13/2023   Post-op Post injection exam found visual acuity of at least counting fingers. The patient tolerated the procedure well. There were no complications. The patient received written and verbal post procedure care education.             ASSESSMENT/PLAN:    ICD-10-CM   1. Left retinal detachment  H33.22     2. Cystoid macular edema of left eye  H35.352 OCT, Retina - OU - Both Eyes    Intravitreal Injection, Pharmacologic Agent - OS - Left Eye    Bevacizumab (AVASTIN) SOLN 1.25 mg    3. Epiretinal membrane (ERM) of left eye  H35.372     4. Retinal tear of right eye  H33.311     5. Essential hypertension  I10     6. Hypertensive retinopathy of both eyes  H35.033     7. 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 Rhegmatogenous retinal detachment with retinal hole, left eye - bullous, temporal, mac off detachment, onset of foveal involvement Tuesday, 03.14.23 by history - detached from 1230 to 430 oclock, fovea off, small tear at 0200 - s/p SBP + PPV/PFO/EL/FAX/14% C3F8 OS, 03.23.2023 - retina attached and in good position -- good buckle height and laser around breaks  - BCVA OS 20/200 -- stable - OCT shows OS: Retina stably re-attached, ERM with blunted foveal contour and mild pucker, persistent cystic changes/diffuse edema, focal pockets of SRF superior to disc --persistent  - FA 12.18.23 shows OD Focal staining ST periphery corresponding to laser retinopexy, OS Mild Hyperfluorescence of disc, mild perifoveal staining SN to fovea--?petaloid -- suggestive of CME - s/p STK #1 (11.20.23) for CME, #2 (02.28.24) - s/p IVA OS #1 (05.01.24), #2 (05.29.24) - IOP OS 19 today -- h/o  steroid response             - continue Prolensa QID OS for CME -- 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 Epiretinal membrane, left eye  - ERM with persistent cystic edema - OCT shows persistent ERM w/ cystic changes slightly increased - monitor for now, but discussed possibility of repeat PPV w/ membrane peel to remove ERM if vision fails to  improve  4. Retinal hole OD  - operculated retinal hole located at 1030 with mild surrounding pigment, no SRF - s/p laser retinopexy OD (03.16.23) -- good laser surrounding - continue to monitor  5,6. Hypertensive retinopathy OU - discussed importance of tight BP control - continue to monitor  7. Pseudophakia OU  - s/p CE/IOL OS (Dr. Zenaida Niece, 10.04.23), CE with IOL OD (11.01.23, Dr. Zenaida Niece)  - IOL in good position, doing well  - continue to monitor  8. Ocular hypertension OS  - likely steroid response  - tmax 33 on 01.29.24  - s/p SLT OS w/ Dr. Wynelle Link on 12.05.23  - IOP today: 19  - cont brim and cosopt TID OS  - cont Latanoprost QHS OS (started 04.17.24)  Ophthalmic Meds Ordered this visit:  Meds ordered this encounter  Medications   erythromycin ophthalmic ointment    Sig: Place 1 Application into the left eye at bedtime.    Dispense:  3.5 g    Refill:  5   Bevacizumab (AVASTIN) SOLN 1.25 mg     Return for f/u next week for STK OS, DFE, OCT.  There are no Patient  Instructions on file for this visit.  Explained the diagnoses, plan, and follow up with the patient and they expressed understanding.  Patient expressed understanding of the importance of proper follow up care.   This document serves as a record of services personally performed by Karie Chimera, MD, PhD. It was created on their behalf by De Blanch, an ophthalmic technician. The creation of this record is the provider's dictation and/or activities during the visit.    Electronically signed by: De Blanch, OA, 12/10/22  6:01 PM  This document serves as a record of services personally performed by Karie Chimera, MD, PhD. It was created on their behalf by Glee Arvin. Manson Passey, OA an ophthalmic technician. The creation of this record is the provider's dictation and/or activities during the visit.    Electronically signed by: Glee Arvin. Manson Passey, OA 12/10/22 6:01 PM  Karie Chimera, M.D., Ph.D. Diseases & Surgery of the Retina and Vitreous [facility]  I have reviewed the above documentation for accuracy and completeness, and I agree with the above. Karie Chimera, M.D., Ph.D. 12/10/22 6:03 PM   Abbreviations: M myopia (nearsighted); A astigmatism; H hyperopia (farsighted); P presbyopia; Mrx spectacle prescription;  CTL contact lenses; OD right eye; OS left eye; OU both eyes  XT exotropia; ET esotropia; PEK punctate epithelial keratitis; PEE punctate epithelial erosions; DES dry eye syndrome; MGD meibomian gland dysfunction; ATs artificial tears; PFAT's preservative free artificial tears; NSC nuclear sclerotic cataract; PSC posterior subcapsular cataract; ERM epi-retinal membrane; PVD posterior vitreous detachment; RD retinal detachment; DM diabetes mellitus; DR diabetic retinopathy; NPDR non-proliferative diabetic retinopathy; PDR proliferative diabetic retinopathy; CSME clinically significant macular edema; DME diabetic macular edema; dbh dot blot hemorrhages; CWS  cotton wool spot; POAG primary open angle glaucoma; C/D cup-to-disc ratio; HVF humphrey visual field; GVF goldmann visual field; OCT optical coherence tomography; IOP intraocular pressure; BRVO Branch retinal vein occlusion; CRVO central retinal vein occlusion; CRAO central retinal artery occlusion; BRAO branch retinal artery occlusion; RT retinal tear; SB scleral buckle; PPV pars plana vitrectomy; VH Vitreous hemorrhage; PRP panretinal laser photocoagulation; IVK intravitreal kenalog; VMT vitreomacular traction; MH Macular hole;  NVD neovascularization of the disc; NVE neovascularization elsewhere; AREDS age related eye disease study; ARMD age related macular degeneration; POAG primary open angle glaucoma; EBMD epithelial/anterior basement membrane dystrophy;  ACIOL anterior chamber intraocular lens; IOL intraocular lens; PCIOL posterior chamber intraocular lens; Phaco/IOL phacoemulsification with intraocular lens placement; [place] photorefractive keratectomy; LASIK laser assisted in situ keratomileusis; HTN hypertension; DM diabetes mellitus; COPD chronic obstructive pulmonary disease

## 2022-12-10 ENCOUNTER — Encounter (INDEPENDENT_AMBULATORY_CARE_PROVIDER_SITE_OTHER): Payer: Self-pay | Admitting: Ophthalmology

## 2022-12-10 ENCOUNTER — Ambulatory Visit (INDEPENDENT_AMBULATORY_CARE_PROVIDER_SITE_OTHER): Payer: Medicare HMO | Admitting: Ophthalmology

## 2022-12-10 DIAGNOSIS — H35372 Puckering of macula, left eye: Secondary | ICD-10-CM | POA: Diagnosis not present

## 2022-12-10 DIAGNOSIS — I10 Essential (primary) hypertension: Secondary | ICD-10-CM

## 2022-12-10 DIAGNOSIS — H40052 Ocular hypertension, left eye: Secondary | ICD-10-CM

## 2022-12-10 DIAGNOSIS — H3322 Serous retinal detachment, left eye: Secondary | ICD-10-CM

## 2022-12-10 DIAGNOSIS — H35033 Hypertensive retinopathy, bilateral: Secondary | ICD-10-CM | POA: Diagnosis not present

## 2022-12-10 DIAGNOSIS — Z961 Presence of intraocular lens: Secondary | ICD-10-CM

## 2022-12-10 DIAGNOSIS — H35352 Cystoid macular degeneration, left eye: Secondary | ICD-10-CM | POA: Diagnosis not present

## 2022-12-10 DIAGNOSIS — H33311 Horseshoe tear of retina without detachment, right eye: Secondary | ICD-10-CM | POA: Diagnosis not present

## 2022-12-10 MED ORDER — BEVACIZUMAB CHEMO INJECTION 1.25MG/0.05ML SYRINGE FOR KALEIDOSCOPE
1.2500 mg | INTRAVITREAL | Status: AC | PRN
  Administered 2022-12-10: 1.25 mg via INTRAVITREAL

## 2022-12-10 MED ORDER — ERYTHROMYCIN 5 MG/GM OP OINT: 1.0000 | TOPICAL_OINTMENT | Freq: Every day | OPHTHALMIC | 5 refills | Status: AC

## 2022-12-11 DIAGNOSIS — R7303 Prediabetes: Secondary | ICD-10-CM | POA: Diagnosis not present

## 2022-12-11 DIAGNOSIS — E785 Hyperlipidemia, unspecified: Secondary | ICD-10-CM | POA: Diagnosis not present

## 2022-12-11 DIAGNOSIS — Z9189 Other specified personal risk factors, not elsewhere classified: Secondary | ICD-10-CM | POA: Diagnosis not present

## 2022-12-11 DIAGNOSIS — E8889 Other specified metabolic disorders: Secondary | ICD-10-CM | POA: Diagnosis not present

## 2022-12-11 DIAGNOSIS — R600 Localized edema: Secondary | ICD-10-CM | POA: Diagnosis not present

## 2022-12-11 DIAGNOSIS — J45909 Unspecified asthma, uncomplicated: Secondary | ICD-10-CM | POA: Diagnosis not present

## 2022-12-11 DIAGNOSIS — Z131 Encounter for screening for diabetes mellitus: Secondary | ICD-10-CM | POA: Diagnosis not present

## 2022-12-11 DIAGNOSIS — K219 Gastro-esophageal reflux disease without esophagitis: Secondary | ICD-10-CM | POA: Diagnosis not present

## 2022-12-11 DIAGNOSIS — R632 Polyphagia: Secondary | ICD-10-CM | POA: Diagnosis not present

## 2022-12-11 DIAGNOSIS — Z6841 Body Mass Index (BMI) 40.0 and over, adult: Secondary | ICD-10-CM | POA: Diagnosis not present

## 2022-12-11 DIAGNOSIS — Z1331 Encounter for screening for depression: Secondary | ICD-10-CM | POA: Diagnosis not present

## 2022-12-11 DIAGNOSIS — I10 Essential (primary) hypertension: Secondary | ICD-10-CM | POA: Diagnosis not present

## 2022-12-11 DIAGNOSIS — Z79899 Other long term (current) drug therapy: Secondary | ICD-10-CM | POA: Diagnosis not present

## 2022-12-11 NOTE — Progress Notes (Signed)
[facility] - Clinic Note  12/17/2022    CHIEF COMPLAINT Patient presents for Retina Follow Up   HISTORY OF PRESENT ILLNESS: Alicia Miranda is a 67 y.o. female who presents to the clinic today for:   HPI     Retina Follow Up   In left eye.  This started 1 week ago.  Duration of 1 week.  Since onset it is stable.  I, the attending physician,  performed the HPI with the patient and updated documentation appropriately.        Comments   1 week retina follow up CME and STK OS pt is reporting vision seems little better she denies any flashes or floaters       Last edited by Rennis Chris, MD on 12/17/2022 11:43 AM.     Pt here for STK OS   Referring physician: Tally Joe, MD [phone] [address]. [address] [place],  [place] [address]  HISTORICAL INFORMATION:   Selected notes from the MEDICAL RECORD NUMBER Referred by Dr. Illene Labrador for possible RD OS LEE: 03/16/02023 Ocular Hx-  PMH- HTN    CURRENT MEDICATIONS: Current Outpatient Medications (Ophthalmic Drugs)  Medication Sig   brimonidine (ALPHAGAN) 0.2 % ophthalmic solution INSTILL 1 DROP INTO LEFT EYE THREE TIMES DAILY   Bromfenac Sodium (PROLENSA) 0.07 % SOLN Place 1 drop into the left eye in the morning, at noon, in the evening, and at bedtime. PT NEEDS 2 BOTTLES DISPENSED AT A TIME-CALL IF COUPON IS NEEDED   dorzolamide (TRUSOPT) 2 % ophthalmic solution Place 1 drop into the left eye 3 (three) times daily. (Patient not taking: Reported on 10/15/2022)   dorzolamide-timolol (COSOPT) 2-0.5 % ophthalmic solution Place 1 drop into the left eye in the morning, at noon, and at bedtime.   erythromycin ophthalmic ointment Place 1 Application into the left eye at bedtime.   latanoprost (XALATAN) 0.005 % ophthalmic solution Place 1 drop into the left eye at bedtime.   LOTEPREDNOL ETABONATE OP Place 1 drop into the right eye 4 (four) times daily.   prednisoLONE acetate (PRED FORTE) 1 % ophthalmic suspension INSTILL  1 DROP INTO LEFT EYE 4 TIMES DAILY (Patient not taking: Reported on 08/13/2022)   prednisoLONE acetate (PRED FORTE) 1 % ophthalmic suspension Place 1 drop into the left eye 4 (four) times daily. (Patient not taking: Reported on 08/13/2022)   No current facility-administered medications for this visit. (Ophthalmic Drugs)   Current Outpatient Medications (Other)  Medication Sig   APPLE CIDER VINEGAR PO Take 2 tablets by mouth daily. gummies   Cholecalciferol (VITAMIN D3) 10 MCG (400 UNIT) tablet Take 400 Units by mouth daily.   CINNAMON PO Take 1,000 mg by mouth every other day.   COLLAGEN PO Take 3 capsules by mouth daily.   Denosumab (PROLIA [place]) Inject 60 mg into the skin every 6 (six) months.   FEROSUL 325 (65 Fe) MG tablet Take 325 mg by mouth daily.   fexofenadine (ALLEGRA) 180 MG tablet Take 180 mg by mouth daily.   fluticasone (FLONASE) 50 MCG/ACT nasal spray Place 1 spray into both nostrils daily.   furosemide (LASIX) 40 MG tablet Take 40 mg by mouth 2 (two) times daily.   Glucosamine HCl (GLUCOSAMINE PO) Take 2 tablets by mouth daily.   losartan (COZAAR) 50 MG tablet Take 100 mg by mouth daily.   Magnesium 400 MG CAPS Take 400 mg by mouth daily.   Melatonin 5 MG CAPS Take 10 mg by mouth  at bedtime.   pantoprazole (PROTONIX) 40 MG tablet Take 40 mg by mouth 2 (two) times daily.   rosuvastatin (CRESTOR) 20 MG tablet Take 20 mg by mouth daily.   TURMERIC PO Take 2 tablets by mouth daily.   No current facility-administered medications for this visit. (Other)   REVIEW OF SYSTEMS: ROS   Positive for: Musculoskeletal, Cardiovascular, Eyes, Respiratory Negative for: Constitutional, Gastrointestinal, Neurological, Skin, Genitourinary, HENT, Endocrine, Psychiatric, Allergic/Imm, Heme/Lymph Last edited by Etheleen Mayhew, COT on 12/17/2022  9:26 AM.      ALLERGIES Allergies  Allergen Reactions   Codeine Itching   PAST MEDICAL HISTORY Past Medical History:  Diagnosis Date    Allergy    Arthritis    legs   Asthma    GERD (gastroesophageal reflux disease)    Hyperlipidemia    Hypertension    Past Surgical History:  Procedure Laterality Date   ABDOMINAL HYSTERECTOMY     CHOLECYSTECTOMY     COLONOSCOPY     GAS INSERTION Left 09/05/2021   Procedure: INSERTION OF GAS - C3F8;  Surgeon: Rennis Chris, MD;  Location: [facility] OR;  Service: Ophthalmology;  Laterality: Left;   GAS/FLUID EXCHANGE Left 09/05/2021   Procedure: GAS/FLUID EXCHANGE;  Surgeon: Rennis Chris, MD;  Location: [facility] OR;  Service: Ophthalmology;  Laterality: Left;   PARS PLANA VITRECTOMY Left 09/05/2021   Procedure: PARS PLANA VITRECTOMY WITH 25 GAUGE;  Surgeon: Rennis Chris, MD;  Location: [facility] OR;  Service: Ophthalmology;  Laterality: Left;   PERFLUORONE INJECTION Left 09/05/2021   Procedure: PERFLUORON INJECTION;  Surgeon: Rennis Chris, MD;  Location: [facility] OR;  Service: Ophthalmology;  Laterality: Left;   PHOTOCOAGULATION WITH LASER Left 09/05/2021   Procedure: PHOTOCOAGULATION WITH LASER;  Surgeon: Rennis Chris, MD;  Location: [facility] OR;  Service: Ophthalmology;  Laterality: Left;   SCLERAL BUCKLE Left 09/05/2021   Procedure: SCLERAL BUCKLE;  Surgeon: Rennis Chris, MD;  Location: [facility] OR;  Service: Ophthalmology;  Laterality: Left;   UPPER GASTROINTESTINAL ENDOSCOPY     FAMILY HISTORY Family History  Problem Relation Age of Onset   Retinal detachment Sister    Diabetes Sister    Glaucoma Brother    Diabetes Brother    Colon cancer Brother        dx at 19   Esophageal cancer Neg Hx    Rectal cancer Neg Hx    Stomach cancer Neg Hx    SOCIAL HISTORY Social History   Tobacco Use   Smoking status: Former   Smokeless tobacco: Never   Tobacco comments:    quit smoking in 2002 or 2003  Vaping Use   Vaping Use: Never used  Substance Use Topics   Alcohol use: Yes    Comment: very rarely   Drug use: No       OPHTHALMIC EXAM:  Base Eye Exam     Visual Acuity (Snellen - Linear)       Right  Left   Dist [place] 20/25 -1 20/300 -1   Dist ph [place] NI NI         Tonometry (Tonopen, 9:29 AM)       Right Left   Pressure 16 13         Pupils       Pupils Dark Light Shape React APD   Right PERRL 3 2 Round Brisk None   Left PERRL 3 2 Round Brisk None         Visual Fields       Left  Right    Full Full         Extraocular Movement       Right Left    Full, Ortho Full, Ortho         Neuro/Psych     Oriented x3: Yes   Mood/Affect: Normal         Dilation     Left eye: 2.5% Phenylephrine @ 9:29 AM           Slit Lamp and Fundus Exam     External Exam       Right Left   External  Periorbital edema -- improved         Slit Lamp Exam       Right Left   Lids/Lashes Dermatochalasis - upper lid Dermatochalasis - upper lid, lid erythema improved   Conjunctiva/Sclera white and quiet White and quiet   Cornea trace PEE, mild tear film debris, well healed cataract wound 3+PEE, mild tear film debris, well healed cataract wound, inferior paracentral corneal haze   Anterior Chamber deep and clear deep and clear   Iris round and dilated round and dilated   Lens PC IOL in good position PC IOL in good position   Anterior Vitreous syneresis, asteroid hyalosis, PVD post vitrectomy         Fundus Exam       Right Left   Disc Pink and Sharp mild Pallor, Sharp rim, Compact   C/D Ratio 0.4 0.3   Macula flat, good foveal reflex, mild RPE mottling, No heme or edema Flat, Blunted foveal reflex, RPE mottling, ERM with striae greatest nasal mac, cystic changes centrally and nasally - persistent/slightly increased, No heme   Vessels attenuated, Tortuous attenuated, Tortuous   Periphery Attached, operculated hole @ 1030 with mild surrounding pigment -- good laser surrounding, no SRF, no new RT/RD Trace pockets of SRF superior to disc with fibrosis, Retina attached over buckle, good buckle height, good laser over buckle and around tears, PRE-OP: small tear with  fibrosis @ 0200, bullous, temporal RD from 1230 to 0430           Refraction     Wearing Rx       Sphere Cylinder Axis Add   Right +1.00 +1.00 130 +2.75   Left +1.25 +0.75 017 +2.75           IMAGING AND PROCEDURES  Imaging and Procedures for 12/17/2022  OCT, Retina - OU - Both Eyes       Right Eye Quality was good. Central Foveal Thickness: 271. Progression has been stable. Findings include normal foveal contour, no IRF, no SRF.   Left Eye Quality was good. Central Foveal Thickness: 690. Progression has been stable. Findings include abnormal foveal contour, epiretinal membrane, intraretinal fluid, macular pucker, subretinal fluid (Retina stably re-attached, ERM with blunted foveal contour and mild pucker, persistent cystic changes/diffuse edema and central SRF).   Notes *Images captured and stored on drive  Diagnosis / Impression:  OD: NFP; no IRF/SRF GN:FAOZHY stably re-attached, ERM with blunted foveal contour and mild pucker, persistent cystic changes/diffuse edema and central SRF  Clinical management:  See below  Abbreviations: NFP - Normal foveal profile. CME - cystoid macular edema. PED - pigment epithelial detachment. IRF - intraretinal fluid. SRF - subretinal fluid. EZ - ellipsoid zone. ERM - epiretinal membrane. ORA - outer retinal atrophy. ORT - outer retinal tubulation. SRHM - subretinal hyper-reflective material. IRHM - intraretinal hyper-reflective material      Injection into Tenon's  Capsule - OS - Left Eye       Time Out 12/17/2022. 10:20 AM. Confirmed correct patient, procedure, site, and patient consented.   Anesthesia Topical anesthesia was used. Anesthetic medications included Lidocaine 2%, Proparacaine 0.5%.   Procedure Preparation included 5% betadine to ocular surface, eyelid speculum. A (25g) needle was used.   Injection: 40 mg triamcinolone acetonide 40 MG/ML   Route: Other, Site: Left Eye   NDC: [phone], Lot: [id number],  Expiration date: 02/13/2024   Post-op Post injection exam found visual acuity of at least counting fingers. The patient tolerated the procedure well. There were no complications. The patient received written and verbal post procedure care education. Post injection medications included ocuflox.   Notes 1.0 cc of Kenalog-40 (40 mg) was injected into subtenon's capsule in the superotemporal quadrant. Betadine was applied to Injection area pre and post-injection then rinsed with sterile BSS. 1 drop of ofloxacin was instilled into the eye. There were no complications. Pt tolerated procedure well.           ASSESSMENT/PLAN:    ICD-10-CM   1. Left retinal detachment  H33.22     2. Cystoid macular edema of left eye  H35.352 OCT, Retina - OU - Both Eyes    Injection into Tenon's Capsule - OS - Left Eye    triamcinolone acetonide (TRIESENCE) 40 MG/ML subtenons injection 40 mg    3. Epiretinal membrane (ERM) of left eye  H35.372     4. Retinal tear of right eye  H33.311     5. Essential hypertension  I10     6. Hypertensive retinopathy of both eyes  H35.033     7. 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 Rhegmatogenous retinal detachment with retinal hole, left eye - bullous, temporal, mac off detachment, onset of foveal involvement Tuesday, 03.14.23 by history - detached from 1230 to 430 oclock, fovea off, small tear at 0200 - s/p SBP + PPV/PFO/EL/FAX/14% C3F8 OS, 03.23.2023 - retina attached and in good position -- good buckle height and laser around breaks  - BCVA OS 20/300 -- stable - OCT shows OS: Retina stably re-attached, ERM with blunted foveal contour and mild pucker, persistent cystic changes/diffuse edema, focal pockets of SRF superior to disc --persistent  - FA 12.18.23 shows OD Focal staining ST periphery corresponding to laser retinopexy, OS Mild Hyperfluorescence of disc, mild perifoveal staining SN to fovea--?petaloid  -- suggestive of CME - s/p STK #1 (11.20.23) for CME, #2 (02.28.24) - s/p IVA OS #1 (05.01.24), #2 (05.29.24), #3 (06.26.24) - IOP OS 13 today -- h/o steroid response             - continue Prolensa Qdaily OS for CME (decreased from QID due to 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 Epiretinal membrane, left eye  - ERM with persistent cystic edema - OCT shows  persistent ERM w/ cystic changes slightly increased - monitor for now, but discussed possibility of repeat PPV w/ membrane peel to remove ERM if vision fails to improve  4. Retinal hole OD  - operculated retinal hole located at 1030 with mild surrounding pigment, no SRF - s/p laser retinopexy OD (03.16.23) -- good laser surrounding - continue to monitor  5,6. Hypertensive retinopathy OU - discussed importance of tight BP control - continue to monitor  7. Pseudophakia OU  - s/p CE/IOL OS (Dr. Zenaida Niece, 10.04.23), CE with IOL OD (11.01.23, Dr. Zenaida Niece)  - IOL in good position, doing well  - continue to monitor  8. Ocular hypertension OS  - likely steroid response  - tmax 33 on 01.29.24  - s/p SLT OS w/ Dr. Wynelle Link on 12.05.23  - IOP today: 13  - cont brim and cosopt TID OS  - cont Latanoprost QHS OS (started 04.17.24)  Ophthalmic Meds Ordered this visit:  Meds ordered this encounter  Medications   triamcinolone acetonide (TRIESENCE) 40 MG/ML subtenons injection 40 mg     Return  for f/u July 24th or later RD OS, DFE, OCT, Possible Injxn.  There are no Patient Instructions on file for this visit.  Explained the diagnoses, plan, and follow up with the patient and they expressed understanding.  Patient expressed understanding of the importance of proper follow up care.   This document serves as a record of services personally performed by Karie Chimera, MD, PhD. It was created on their behalf by De Blanch, an ophthalmic technician. The creation of this record is the provider's dictation and/or activities during the visit.    Electronically signed by: De Blanch, OA, 12/17/22  11:45 AM  Karie Chimera, M.D., Ph.D. Diseases & Surgery of the Retina and Vitreous [facility]  I have reviewed the above documentation for accuracy and completeness, and I agree with the above. Karie Chimera, M.D., Ph.D. 12/17/22 11:47 AM   Abbreviations: M myopia (nearsighted); A astigmatism; H hyperopia (farsighted); P presbyopia; Mrx spectacle prescription;  CTL contact lenses; OD right eye; OS left eye; OU both eyes  XT exotropia; ET esotropia; PEK punctate epithelial keratitis; PEE punctate epithelial erosions; DES dry eye syndrome; MGD meibomian gland dysfunction; ATs artificial tears; PFAT's preservative free artificial tears; NSC nuclear sclerotic cataract; PSC posterior subcapsular cataract; ERM epi-retinal membrane; PVD posterior vitreous detachment; RD retinal detachment; DM diabetes mellitus; DR diabetic retinopathy; NPDR non-proliferative diabetic retinopathy; PDR proliferative diabetic retinopathy; CSME clinically significant macular edema; DME diabetic macular edema; dbh dot blot hemorrhages; CWS cotton wool spot; POAG primary open angle glaucoma; C/D cup-to-disc ratio; HVF humphrey visual field; GVF goldmann visual field; OCT optical coherence tomography; IOP intraocular pressure; BRVO Branch retinal vein occlusion; CRVO central retinal vein  occlusion; CRAO central retinal artery occlusion; BRAO branch retinal artery occlusion; RT retinal tear; SB scleral buckle; PPV pars plana vitrectomy; VH Vitreous hemorrhage; PRP panretinal laser photocoagulation; IVK intravitreal kenalog; VMT vitreomacular traction; MH Macular hole;  NVD neovascularization of the disc; NVE neovascularization elsewhere; AREDS age related eye disease study; ARMD age related macular degeneration; POAG primary open angle glaucoma; EBMD epithelial/anterior basement membrane dystrophy; ACIOL anterior chamber intraocular lens; IOL intraocular lens; PCIOL posterior chamber intraocular lens; Phaco/IOL phacoemulsification with intraocular lens placement; PRK photorefractive keratectomy; LASIK laser assisted in situ keratomileusis; HTN hypertension; DM diabetes mellitus; COPD chronic obstructive pulmonary disease

## 2022-12-17 ENCOUNTER — Ambulatory Visit (INDEPENDENT_AMBULATORY_CARE_PROVIDER_SITE_OTHER): Payer: Medicare HMO | Admitting: Ophthalmology

## 2022-12-17 ENCOUNTER — Encounter (INDEPENDENT_AMBULATORY_CARE_PROVIDER_SITE_OTHER): Payer: Self-pay | Admitting: Ophthalmology

## 2022-12-17 DIAGNOSIS — H40052 Ocular hypertension, left eye: Secondary | ICD-10-CM

## 2022-12-17 DIAGNOSIS — H35372 Puckering of macula, left eye: Secondary | ICD-10-CM

## 2022-12-17 DIAGNOSIS — H35033 Hypertensive retinopathy, bilateral: Secondary | ICD-10-CM | POA: Diagnosis not present

## 2022-12-17 DIAGNOSIS — Z961 Presence of intraocular lens: Secondary | ICD-10-CM

## 2022-12-17 DIAGNOSIS — H35352 Cystoid macular degeneration, left eye: Secondary | ICD-10-CM

## 2022-12-17 DIAGNOSIS — H3322 Serous retinal detachment, left eye: Secondary | ICD-10-CM

## 2022-12-17 DIAGNOSIS — I10 Essential (primary) hypertension: Secondary | ICD-10-CM

## 2022-12-17 DIAGNOSIS — H33311 Horseshoe tear of retina without detachment, right eye: Secondary | ICD-10-CM

## 2022-12-17 MED ORDER — TRIAMCINOLONE ACETONIDE 40 MG/ML IO SUSP
40.0000 mg | INTRAOCULAR | Status: AC | PRN
  Administered 2022-12-17: 40 mg

## 2022-12-25 DIAGNOSIS — Z6841 Body Mass Index (BMI) 40.0 and over, adult: Secondary | ICD-10-CM | POA: Diagnosis not present

## 2022-12-25 DIAGNOSIS — J45909 Unspecified asthma, uncomplicated: Secondary | ICD-10-CM | POA: Diagnosis not present

## 2022-12-25 DIAGNOSIS — K219 Gastro-esophageal reflux disease without esophagitis: Secondary | ICD-10-CM | POA: Diagnosis not present

## 2022-12-25 DIAGNOSIS — F5081 Binge eating disorder: Secondary | ICD-10-CM | POA: Diagnosis not present

## 2022-12-25 DIAGNOSIS — I10 Essential (primary) hypertension: Secondary | ICD-10-CM | POA: Diagnosis not present

## 2022-12-25 DIAGNOSIS — R7303 Prediabetes: Secondary | ICD-10-CM | POA: Diagnosis not present

## 2022-12-25 DIAGNOSIS — E785 Hyperlipidemia, unspecified: Secondary | ICD-10-CM | POA: Diagnosis not present

## 2022-12-25 DIAGNOSIS — I872 Venous insufficiency (chronic) (peripheral): Secondary | ICD-10-CM | POA: Diagnosis not present

## 2022-12-25 DIAGNOSIS — R600 Localized edema: Secondary | ICD-10-CM | POA: Diagnosis not present

## 2023-01-05 ENCOUNTER — Other Ambulatory Visit (INDEPENDENT_AMBULATORY_CARE_PROVIDER_SITE_OTHER): Payer: Self-pay | Admitting: Ophthalmology

## 2023-01-05 NOTE — Progress Notes (Signed)
[facility] - Clinic Note  01/07/2023    CHIEF COMPLAINT Patient presents for Retina Follow Up   HISTORY OF PRESENT ILLNESS: Alicia Miranda is a 67 y.o. female who presents to the clinic today for:   HPI     Retina Follow Up   Patient presents with  Other.  In left eye.  This started 3 weeks ago.  I, the attending physician,  performed the HPI with the patient and updated documentation appropriately.        Comments   Patient here for 3 weeks retina follow up for CME OS. Patient states vision seems a little better. It is trying. Very slightly. No eye pain. Using drops. Blue top TID OS, Purple top TID OS, green top QHS OS and Prolensa QHS OS.      Last edited by Rennis Chris, MD on 01/07/2023 10:15 PM.     Referring physician: Tally Joe, MD [phone] [place],  [place] [address]  HISTORICAL INFORMATION:   Selected notes from the MEDICAL RECORD NUMBER Referred by Dr. Illene Labrador for possible RD OS LEE: 03/16/02023 Ocular Hx-  PMH- HTN    CURRENT MEDICATIONS: Current Outpatient Medications (Ophthalmic Drugs)  Medication Sig   brimonidine (ALPHAGAN) 0.2 % ophthalmic solution INSTILL 1 DROP INTO LEFT EYE THREE TIMES DAILY   Bromfenac Sodium (PROLENSA) 0.07 % SOLN Place 1 drop into the left eye in the morning, at noon, in the evening, and at bedtime. PT NEEDS 2 BOTTLES DISPENSED AT A TIME-CALL IF COUPON IS NEEDED   dorzolamide-timolol (COSOPT) 2-0.5 % ophthalmic solution Place 1 drop into the left eye in the morning, at noon, and at bedtime.   erythromycin ophthalmic ointment Place 1 Application into the left eye at bedtime.   latanoprost (XALATAN) 0.005 % ophthalmic solution Place 1 drop into the left eye at bedtime.   LOTEPREDNOL ETABONATE OP Place 1 drop into the right eye 4 (four) times daily.   dorzolamide (TRUSOPT) 2 % ophthalmic solution Place 1 drop into the left eye 3 (three) times daily. (Patient not taking: Reported on  01/07/2023)   prednisoLONE acetate (PRED FORTE) 1 % ophthalmic suspension INSTILL 1 DROP INTO LEFT EYE 4 TIMES DAILY (Patient not taking: Reported on 08/13/2022)   prednisoLONE acetate (PRED FORTE) 1 % ophthalmic suspension Place 1 drop into the left eye 4 (four) times daily. (Patient not taking: Reported on 08/13/2022)   No current facility-administered medications for this visit. (Ophthalmic Drugs)   Current Outpatient Medications (Other)  Medication Sig   APPLE CIDER VINEGAR PO Take 2 tablets by mouth daily. gummies   Cholecalciferol (VITAMIN D3) 10 MCG (400 UNIT) tablet Take 400 Units by mouth daily.   CINNAMON PO Take 1,000 mg by mouth every other day.   COLLAGEN PO Take 3 capsules by mouth daily.   Denosumab (PROLIA [place]) Inject 60 mg into the skin every 6 (six) months.   FEROSUL 325 (65 Fe) MG tablet Take 325 mg by mouth daily.   fexofenadine (ALLEGRA) 180 MG tablet Take 180 mg by mouth daily.   fluticasone (FLONASE) 50 MCG/ACT nasal spray Place 1 spray into both nostrils daily.   furosemide (LASIX) 40 MG tablet Take 40 mg by mouth 2 (two) times daily.   Glucosamine HCl (GLUCOSAMINE PO) Take 2 tablets by mouth daily.   losartan (COZAAR) 50 MG tablet Take 100 mg by mouth daily.   Magnesium 400 MG CAPS Take 400 mg by mouth daily.  Melatonin 5 MG CAPS Take 10 mg by mouth at bedtime.   pantoprazole (PROTONIX) 40 MG tablet Take 40 mg by mouth 2 (two) times daily.   rosuvastatin (CRESTOR) 20 MG tablet Take 20 mg by mouth daily.   TURMERIC PO Take 2 tablets by mouth daily.   No current facility-administered medications for this visit. (Other)   REVIEW OF SYSTEMS: ROS   Positive for: Musculoskeletal, Cardiovascular, Eyes, Respiratory Negative for: Constitutional, Gastrointestinal, Neurological, Skin, Genitourinary, HENT, Endocrine, Psychiatric, Allergic/Imm, Heme/Lymph Last edited by Laddie Aquas, COA on 01/07/2023  9:12 AM.     ALLERGIES Allergies  Allergen Reactions   Codeine  Itching   PAST MEDICAL HISTORY Past Medical History:  Diagnosis Date   Allergy    Arthritis    legs   Asthma    GERD (gastroesophageal reflux disease)    Hyperlipidemia    Hypertension    Past Surgical History:  Procedure Laterality Date   ABDOMINAL HYSTERECTOMY     CHOLECYSTECTOMY     COLONOSCOPY     GAS INSERTION Left 09/05/2021   Procedure: INSERTION OF GAS - C3F8;  Surgeon: Rennis Chris, MD;  Location: [facility] OR;  Service: Ophthalmology;  Laterality: Left;   GAS/FLUID EXCHANGE Left 09/05/2021   Procedure: GAS/FLUID EXCHANGE;  Surgeon: Rennis Chris, MD;  Location: [facility] OR;  Service: Ophthalmology;  Laterality: Left;   PARS PLANA VITRECTOMY Left 09/05/2021   Procedure: PARS PLANA VITRECTOMY WITH 25 GAUGE;  Surgeon: Rennis Chris, MD;  Location: [facility] OR;  Service: Ophthalmology;  Laterality: Left;   PERFLUORONE INJECTION Left 09/05/2021   Procedure: PERFLUORON INJECTION;  Surgeon: Rennis Chris, MD;  Location: [facility] OR;  Service: Ophthalmology;  Laterality: Left;   PHOTOCOAGULATION WITH LASER Left 09/05/2021   Procedure: PHOTOCOAGULATION WITH LASER;  Surgeon: Rennis Chris, MD;  Location: [facility] OR;  Service: Ophthalmology;  Laterality: Left;   SCLERAL BUCKLE Left 09/05/2021   Procedure: SCLERAL BUCKLE;  Surgeon: Rennis Chris, MD;  Location: [facility] OR;  Service: Ophthalmology;  Laterality: Left;   UPPER GASTROINTESTINAL ENDOSCOPY     FAMILY HISTORY Family History  Problem Relation Age of Onset   Retinal detachment Sister    Diabetes Sister    Glaucoma Brother    Diabetes Brother    Colon cancer Brother        dx at 54   Esophageal cancer Neg Hx    Rectal cancer Neg Hx    Stomach cancer Neg Hx    SOCIAL HISTORY Social History   Tobacco Use   Smoking status: Former   Smokeless tobacco: Never   Tobacco comments:    quit smoking in 2002 or 2003  Vaping Use   Vaping status: Never Used  Substance Use Topics   Alcohol use: Yes    Comment: very rarely   Drug use: No       OPHTHALMIC  EXAM:  Base Eye Exam     Visual Acuity (Snellen - Linear)       Right Left   Dist [place] 20/20 -1 20/200 -2   Dist ph   NI         Tonometry (Tonopen, 9:09 AM)       Right Left   Pressure 12 14         Pupils       Dark Light Shape React APD   Right 3 2 Round Brisk None   Left 3 2 Round Brisk None         Visual Fields (Counting fingers)  Left Right    Full Full         Extraocular Movement       Right Left    Full, Ortho Full, Ortho         Neuro/Psych     Oriented x3: Yes   Mood/Affect: Normal         Dilation     Both eyes: 1.0% Mydriacyl, 2.5% Phenylephrine @ 9:09 AM           Slit Lamp and Fundus Exam     External Exam       Right Left   External  Periorbital edema -- improved         Slit Lamp Exam       Right Left   Lids/Lashes Dermatochalasis - upper lid Dermatochalasis - upper lid, lid erythema improved   Conjunctiva/Sclera white and quiet White and quiet, STK ST quad   Cornea trace PEE, mild tear film debris, well healed cataract wound 3+PEE, mild tear film debris, well healed cataract wound, inferior paracentral corneal haze   Anterior Chamber deep and clear deep and clear   Iris round and dilated round and dilated   Lens PC IOL in good position PC IOL in good position   Anterior Vitreous syneresis, asteroid hyalosis, PVD post vitrectomy         Fundus Exam       Right Left   Disc Pink and Sharp mild Pallor, Sharp rim, Compact   C/D Ratio 0.4 0.3   Macula flat, good foveal reflex, mild RPE mottling, No heme or edema Flat, Blunted foveal reflex, RPE mottling, ERM with striae greatest nasal mac, cystic changes centrally and nasally - persistent, No heme   Vessels attenuated, Tortuous attenuated, Tortuous   Periphery Attached, operculated hole @ 1030 with mild surrounding pigment -- good laser surrounding, no SRF, no new RT/RD Attached, Trace pockets of SRF superior to disc with fibrosis, Retina attached over  buckle, good buckle height, good laser over buckle and around tears, PRE-OP: small tear with fibrosis @ 0200, bullous, temporal RD from 1230 to 0430           Refraction     Wearing Rx       Sphere Cylinder Axis Add   Right +1.00 +1.00 130 +2.75   Left +1.25 +0.75 017 +2.75           IMAGING AND PROCEDURES  Imaging and Procedures for 01/07/2023  OCT, Retina - OU - Both Eyes       Right Eye Quality was good. Central Foveal Thickness: 267. Progression has been stable. Findings include normal foveal contour, no IRF, no SRF.   Left Eye Quality was good. Central Foveal Thickness: 685. Progression has been stable. Findings include abnormal foveal contour, epiretinal membrane, intraretinal fluid, macular pucker, subretinal fluid (Retina stably re-attached, ERM with blunted foveal contour and mild pucker, persistent cystic changes/diffuse edema and central SRF-- no significant change from prior).   Notes *Images captured and stored on drive  Diagnosis / Impression:  OD: NFP; no IRF/SRF CB:JSEGBT stably re-attached, ERM with blunted foveal contour and mild pucker, persistent cystic changes/diffuse edema and central SRF -- no significant change from prior  Clinical management:  See below  Abbreviations: NFP - Normal foveal profile. CME - cystoid macular edema. PED - pigment epithelial detachment. IRF - intraretinal fluid. SRF - subretinal fluid. EZ - ellipsoid zone. ERM - epiretinal membrane. ORA - outer retinal atrophy. ORT - outer retinal tubulation. SRHM -  subretinal hyper-reflective material. IRHM - intraretinal hyper-reflective material      Intravitreal Injection, Pharmacologic Agent - OS - Left Eye       Time Out 01/07/2023. 10:19 AM. Confirmed correct patient, procedure, site, and patient consented.   Anesthesia Topical anesthesia was used. Anesthetic medications included Lidocaine 2%, Proparacaine 0.5%.   Procedure Preparation included 5% betadine to ocular  surface, eyelid speculum. A (32g) needle was used.   Injection: 2 mg aflibercept 2 MG/0.05ML   Route: Intravitreal, Site: Left Eye   NDC: L6038910, Lot: [id number], Expiration date: 02/14/2024, Waste: 0 mL   Post-op Post injection exam found visual acuity of at least counting fingers. The patient tolerated the procedure well. There were no complications. The patient received written and verbal post procedure care education.            ASSESSMENT/PLAN:    ICD-10-CM   1. Cystoid macular edema of left eye  H35.352 OCT, Retina - OU - Both Eyes    Intravitreal Injection, Pharmacologic Agent - OS - Left Eye    aflibercept (EYLEA) SOLN 2 mg    2. Left retinal detachment  H33.22     3. Epiretinal membrane (ERM) of left eye  H35.372     4. Retinal tear of right eye  H33.311     5. Essential hypertension  I10     6. Hypertensive retinopathy of both eyes  H35.033     7. 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 Rhegmatogenous retinal detachment with retinal hole, left eye - bullous, temporal, mac off detachment, onset of foveal involvement Tuesday, 03.14.23 by history - detached from 1230 to 430 oclock, fovea off, small tear at 0200 - s/p SBP + PPV/PFO/EL/FAX/14% C3F8 OS, 03.23.2023 - retina attached and in good position -- good buckle height and laser around breaks  - BCVA OS 20/200 - OCT shows OS: Retina stably re-attached, ERM with blunted foveal contour and mild pucker, persistent cystic changes/diffuse edema, focal pockets of SRF superior to disc --persistent  - FA 12.18.23 shows OD Focal staining ST periphery corresponding to laser retinopexy, OS Mild Hyperfluorescence of disc, mild perifoveal staining SN to fovea--?petaloid -- suggestive of CME - s/p STK #1 (11.20.23) for CME, #2 (02.28.24), #3 (07.03.24) - s/p IVA OS #1 (05.01.24), #2 (05.29.24), #3 (06.26.24) - IOP OS 14 today -- h/o steroid response - continue Prolensa  Qdaily OS for CME (decreased from QID due to 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)  - continue holding PF(h/o steroid response)  - continue Brimonidine TID OS, Cosopt TID OS, and Latanoprost QHS OS  **discussed decreased efficacy / resistance to Avastin and potential benefit of switching medication**  - recommend IVE #1 OS today, 07.24.24, for persistent CME - pt wishes to proceed with injection - RBA of procedure discussed, questions answered - IVA informed consent obtained and signed, 05.01.24 (OS) - STK informed consent obtained and signed, 11.20.23 (OS) - IVE informed consent obtained and signed, 07.24.24 (OS) - see procedure note - Eylea authorized for 2024 - f/u  4 week - DFE, OCT, possible injection  3. Epiretinal membrane, left eye  - ERM with persistent cystic edema - OCT shows persistent ERM w/ cystic changes slightly increased - monitor for now, but discussed possibility of repeat PPV w/ membrane peel to remove ERM if vision fails to improve  4. Retinal hole OD  - operculated retinal hole located at 1030 with  mild surrounding pigment, no SRF - s/p laser retinopexy OD (03.16.23) -- good laser surrounding - continue to monitor  5,6. Hypertensive retinopathy OU - discussed importance of tight BP control - continue to monitor  7. Pseudophakia OU  - s/p CE/IOL OS (Dr. Zenaida Niece, 10.04.23), CE with IOL OD (11.01.23, Dr. Zenaida Niece)  - IOL in good position, doing well  - continue to monitor  8. Ocular hypertension OS  - likely steroid response  - tmax 33 on 01.29.24  - s/p SLT OS w/ Dr. Wynelle Link on 12.05.23  - IOP today: 14  - cont brim and cosopt TID OS  - cont Latanoprost QHS OS (started 04.17.24)  Ophthalmic Meds Ordered this visit:  Meds ordered this encounter  Medications   aflibercept (EYLEA) SOLN 2 mg     Return in about 4 weeks (around 02/04/2023) for f/u CME OS , DFE, OCT, Possible, IVE, OS.  There are no Patient Instructions on file for this visit.  Explained the diagnoses, plan,  and follow up with the patient and they expressed understanding.  Patient expressed understanding of the importance of proper follow up care.   This document serves as a record of services personally performed by Karie Chimera, MD, PhD. It was created on their behalf by De Blanch, an ophthalmic technician. The creation of this record is the provider's dictation and/or activities during the visit.    Electronically signed by: De Blanch, OA, 01/07/23  10:17 PM  This document serves as a record of services personally performed by Karie Chimera, MD, PhD. It was created on their behalf by Gerilyn Nestle, COT an ophthalmic technician. The creation of this record is the provider's dictation and/or activities during the visit.    Electronically signed by:  Charlette Caffey, COT  01/07/23 10:17 PM  Karie Chimera, M.D., Ph.D. Diseases & Surgery of the Retina and Vitreous [facility]  I have reviewed the above documentation for accuracy and completeness, and I agree with the above. Karie Chimera, M.D., Ph.D. 01/07/23 10:18 PM   Abbreviations: M myopia (nearsighted); A astigmatism; H hyperopia (farsighted); P presbyopia; Mrx spectacle prescription;  CTL contact lenses; OD right eye; OS left eye; OU both eyes  XT exotropia; ET esotropia; PEK punctate epithelial keratitis; PEE punctate epithelial erosions; DES dry eye syndrome; MGD meibomian gland dysfunction; ATs artificial tears; PFAT's preservative free artificial tears; NSC nuclear sclerotic cataract; PSC posterior subcapsular cataract; ERM epi-retinal membrane; PVD posterior vitreous detachment; RD retinal detachment; DM diabetes mellitus; DR diabetic retinopathy; NPDR non-proliferative diabetic retinopathy; PDR proliferative diabetic retinopathy; CSME clinically significant macular edema; DME diabetic macular edema; dbh dot blot hemorrhages; CWS cotton wool spot; POAG primary open angle glaucoma; C/D  cup-to-disc ratio; HVF humphrey visual field; GVF goldmann visual field; OCT optical coherence tomography; IOP intraocular pressure; BRVO Branch retinal vein occlusion; CRVO central retinal vein occlusion; CRAO central retinal artery occlusion; BRAO branch retinal artery occlusion; RT retinal tear; SB scleral buckle; PPV pars plana vitrectomy; VH Vitreous hemorrhage; PRP panretinal laser photocoagulation; IVK intravitreal kenalog; VMT vitreomacular traction; MH Macular hole;  NVD neovascularization of the disc; NVE neovascularization elsewhere; AREDS age related eye disease study; ARMD age related macular degeneration; POAG primary open angle glaucoma; EBMD epithelial/anterior basement membrane dystrophy; ACIOL anterior chamber intraocular lens; IOL intraocular lens; PCIOL posterior chamber intraocular lens; Phaco/IOL phacoemulsification with intraocular lens placement; PRK photorefractive keratectomy; LASIK laser assisted in situ keratomileusis; HTN hypertension; DM diabetes mellitus; COPD chronic obstructive pulmonary disease

## 2023-01-07 ENCOUNTER — Encounter (INDEPENDENT_AMBULATORY_CARE_PROVIDER_SITE_OTHER): Payer: Self-pay | Admitting: Ophthalmology

## 2023-01-07 ENCOUNTER — Ambulatory Visit (INDEPENDENT_AMBULATORY_CARE_PROVIDER_SITE_OTHER): Payer: Medicare HMO | Admitting: Ophthalmology

## 2023-01-07 DIAGNOSIS — I10 Essential (primary) hypertension: Secondary | ICD-10-CM | POA: Diagnosis not present

## 2023-01-07 DIAGNOSIS — H40052 Ocular hypertension, left eye: Secondary | ICD-10-CM | POA: Diagnosis not present

## 2023-01-07 DIAGNOSIS — H33311 Horseshoe tear of retina without detachment, right eye: Secondary | ICD-10-CM | POA: Diagnosis not present

## 2023-01-07 DIAGNOSIS — H35352 Cystoid macular degeneration, left eye: Secondary | ICD-10-CM

## 2023-01-07 DIAGNOSIS — Z961 Presence of intraocular lens: Secondary | ICD-10-CM | POA: Diagnosis not present

## 2023-01-07 DIAGNOSIS — H35033 Hypertensive retinopathy, bilateral: Secondary | ICD-10-CM

## 2023-01-07 DIAGNOSIS — H3322 Serous retinal detachment, left eye: Secondary | ICD-10-CM | POA: Diagnosis not present

## 2023-01-07 DIAGNOSIS — H35372 Puckering of macula, left eye: Secondary | ICD-10-CM | POA: Diagnosis not present

## 2023-01-07 MED ORDER — AFLIBERCEPT 2MG/0.05ML IZ SOLN FOR KALEIDOSCOPE
2.0000 mg | INTRAVITREAL | Status: AC | PRN
  Administered 2023-01-07: 2 mg via INTRAVITREAL

## 2023-01-08 DIAGNOSIS — I872 Venous insufficiency (chronic) (peripheral): Secondary | ICD-10-CM | POA: Diagnosis not present

## 2023-01-08 DIAGNOSIS — Z6841 Body Mass Index (BMI) 40.0 and over, adult: Secondary | ICD-10-CM | POA: Diagnosis not present

## 2023-01-08 DIAGNOSIS — F5081 Binge eating disorder: Secondary | ICD-10-CM | POA: Diagnosis not present

## 2023-01-08 DIAGNOSIS — I10 Essential (primary) hypertension: Secondary | ICD-10-CM | POA: Diagnosis not present

## 2023-01-08 DIAGNOSIS — R600 Localized edema: Secondary | ICD-10-CM | POA: Diagnosis not present

## 2023-01-08 DIAGNOSIS — R7303 Prediabetes: Secondary | ICD-10-CM | POA: Diagnosis not present

## 2023-01-08 DIAGNOSIS — E785 Hyperlipidemia, unspecified: Secondary | ICD-10-CM | POA: Diagnosis not present

## 2023-01-08 DIAGNOSIS — J45909 Unspecified asthma, uncomplicated: Secondary | ICD-10-CM | POA: Diagnosis not present

## 2023-01-08 DIAGNOSIS — K219 Gastro-esophageal reflux disease without esophagitis: Secondary | ICD-10-CM | POA: Diagnosis not present

## 2023-01-14 ENCOUNTER — Other Ambulatory Visit (INDEPENDENT_AMBULATORY_CARE_PROVIDER_SITE_OTHER): Payer: Self-pay

## 2023-01-14 MED ORDER — DORZOLAMIDE HCL-TIMOLOL MAL 2-0.5 % OP SOLN: 1.0000 [drp] | Freq: Three times a day (TID) | OPHTHALMIC | 2 refills | Status: DC

## 2023-01-29 DIAGNOSIS — F5081 Binge eating disorder: Secondary | ICD-10-CM | POA: Diagnosis not present

## 2023-01-29 DIAGNOSIS — K219 Gastro-esophageal reflux disease without esophagitis: Secondary | ICD-10-CM | POA: Diagnosis not present

## 2023-01-29 DIAGNOSIS — R600 Localized edema: Secondary | ICD-10-CM | POA: Diagnosis not present

## 2023-01-29 DIAGNOSIS — I872 Venous insufficiency (chronic) (peripheral): Secondary | ICD-10-CM | POA: Diagnosis not present

## 2023-01-29 DIAGNOSIS — E785 Hyperlipidemia, unspecified: Secondary | ICD-10-CM | POA: Diagnosis not present

## 2023-01-29 DIAGNOSIS — I10 Essential (primary) hypertension: Secondary | ICD-10-CM | POA: Diagnosis not present

## 2023-01-29 DIAGNOSIS — R7303 Prediabetes: Secondary | ICD-10-CM | POA: Diagnosis not present

## 2023-01-29 DIAGNOSIS — J45909 Unspecified asthma, uncomplicated: Secondary | ICD-10-CM | POA: Diagnosis not present

## 2023-01-29 DIAGNOSIS — Z6841 Body Mass Index (BMI) 40.0 and over, adult: Secondary | ICD-10-CM | POA: Diagnosis not present

## 2023-02-02 NOTE — Progress Notes (Signed)
[facility] - Clinic Note  02/04/2023    CHIEF COMPLAINT Patient presents for Retina Follow Up   HISTORY OF PRESENT ILLNESS: Alicia Miranda is a 67 y.o. female who presents to the clinic today for:   HPI     Retina Follow Up   In left eye.  This started 4 weeks ago.  Duration of 4 weeks.  Since onset it is stable.        Comments   4 week retina follow up CME OS and I'VE OS pt is reporting no vision changes noticed she denies any flashes or floaters she is using brimonidine tid ou dorz/tim tid ou and latanoprost ou at bed time prolensa at bed time       Last edited by Etheleen Mayhew, COT on 02/04/2023  9:19 AM.      Patient states that she is unable to see any visual improvement.   Referring physician: Tally Joe, MD [phone] [place],  [place] [address]  HISTORICAL INFORMATION:   Selected notes from the MEDICAL RECORD NUMBER Referred by Dr. Illene Labrador for possible RD OS LEE: 03/16/02023 Ocular Hx-  PMH- HTN    CURRENT MEDICATIONS: Current Outpatient Medications (Ophthalmic Drugs)  Medication Sig   brimonidine (ALPHAGAN) 0.2 % ophthalmic solution INSTILL 1 DROP INTO LEFT EYE THREE TIMES DAILY   Bromfenac Sodium (PROLENSA) 0.07 % SOLN Place 1 drop into the left eye in the morning, at noon, in the evening, and at bedtime. PT NEEDS 2 BOTTLES DISPENSED AT A TIME-CALL IF COUPON IS NEEDED   dorzolamide (TRUSOPT) 2 % ophthalmic solution Place 1 drop into the left eye 3 (three) times daily. (Patient not taking: Reported on 01/07/2023)   dorzolamide-timolol (COSOPT) 2-0.5 % ophthalmic solution Place 1 drop into the left eye in the morning, at noon, and at bedtime.   erythromycin ophthalmic ointment Place 1 Application into the left eye at bedtime.   latanoprost (XALATAN) 0.005 % ophthalmic solution Place 1 drop into the left eye at bedtime.   LOTEPREDNOL ETABONATE OP Place 1 drop into the right eye 4 (four) times daily.    prednisoLONE acetate (PRED FORTE) 1 % ophthalmic suspension INSTILL 1 DROP INTO LEFT EYE 4 TIMES DAILY (Patient not taking: Reported on 08/13/2022)   prednisoLONE acetate (PRED FORTE) 1 % ophthalmic suspension Place 1 drop into the left eye 4 (four) times daily. (Patient not taking: Reported on 08/13/2022)   No current facility-administered medications for this visit. (Ophthalmic Drugs)   Current Outpatient Medications (Other)  Medication Sig   metformin (FORTAMET) 500 MG (OSM) 24 hr tablet Take 500 mg by mouth daily with breakfast.   APPLE CIDER VINEGAR PO Take 2 tablets by mouth daily. gummies   Cholecalciferol (VITAMIN D3) 10 MCG (400 UNIT) tablet Take 400 Units by mouth daily.   CINNAMON PO Take 1,000 mg by mouth every other day.   COLLAGEN PO Take 3 capsules by mouth daily.   Denosumab (PROLIA [place]) Inject 60 mg into the skin every 6 (six) months.   FEROSUL 325 (65 Fe) MG tablet Take 325 mg by mouth daily.   fexofenadine (ALLEGRA) 180 MG tablet Take 180 mg by mouth daily.   fluticasone (FLONASE) 50 MCG/ACT nasal spray Place 1 spray into both nostrils daily.   furosemide (LASIX) 40 MG tablet Take 40 mg by mouth 2 (two) times daily.   Glucosamine HCl (GLUCOSAMINE PO) Take 2 tablets by mouth daily.   losartan (COZAAR) 50  MG tablet Take 100 mg by mouth daily.   Magnesium 400 MG CAPS Take 400 mg by mouth daily.   Melatonin 5 MG CAPS Take 10 mg by mouth at bedtime.   pantoprazole (PROTONIX) 40 MG tablet Take 40 mg by mouth 2 (two) times daily.   rosuvastatin (CRESTOR) 20 MG tablet Take 20 mg by mouth daily.   TURMERIC PO Take 2 tablets by mouth daily.   No current facility-administered medications for this visit. (Other)   REVIEW OF SYSTEMS: ROS   Positive for: Musculoskeletal, Cardiovascular, Eyes, Respiratory Negative for: Constitutional, Gastrointestinal, Neurological, Skin, Genitourinary, HENT, Endocrine, Psychiatric, Allergic/Imm, Heme/Lymph Last edited by Etheleen Mayhew, COT  on 02/04/2023  9:16 AM.      ALLERGIES Allergies  Allergen Reactions   Codeine Itching   PAST MEDICAL HISTORY Past Medical History:  Diagnosis Date   Allergy    Arthritis    legs   Asthma    GERD (gastroesophageal reflux disease)    Hyperlipidemia    Hypertension    Past Surgical History:  Procedure Laterality Date   ABDOMINAL HYSTERECTOMY     CHOLECYSTECTOMY     COLONOSCOPY     GAS INSERTION Left 09/05/2021   Procedure: INSERTION OF GAS - C3F8;  Surgeon: Rennis Chris, MD;  Location: [facility] OR;  Service: Ophthalmology;  Laterality: Left;   GAS/FLUID EXCHANGE Left 09/05/2021   Procedure: GAS/FLUID EXCHANGE;  Surgeon: Rennis Chris, MD;  Location: [facility] OR;  Service: Ophthalmology;  Laterality: Left;   PARS PLANA VITRECTOMY Left 09/05/2021   Procedure: PARS PLANA VITRECTOMY WITH 25 GAUGE;  Surgeon: Rennis Chris, MD;  Location: [facility] OR;  Service: Ophthalmology;  Laterality: Left;   PERFLUORONE INJECTION Left 09/05/2021   Procedure: PERFLUORON INJECTION;  Surgeon: Rennis Chris, MD;  Location: [facility] OR;  Service: Ophthalmology;  Laterality: Left;   PHOTOCOAGULATION WITH LASER Left 09/05/2021   Procedure: PHOTOCOAGULATION WITH LASER;  Surgeon: Rennis Chris, MD;  Location: [facility] OR;  Service: Ophthalmology;  Laterality: Left;   SCLERAL BUCKLE Left 09/05/2021   Procedure: SCLERAL BUCKLE;  Surgeon: Rennis Chris, MD;  Location: [facility] OR;  Service: Ophthalmology;  Laterality: Left;   UPPER GASTROINTESTINAL ENDOSCOPY     FAMILY HISTORY Family History  Problem Relation Age of Onset   Retinal detachment Sister    Diabetes Sister    Glaucoma Brother    Diabetes Brother    Colon cancer Brother        dx at 88   Esophageal cancer Neg Hx    Rectal cancer Neg Hx    Stomach cancer Neg Hx    SOCIAL HISTORY Social History   Tobacco Use   Smoking status: Former   Smokeless tobacco: Never   Tobacco comments:    quit smoking in 2002 or 2003  Vaping Use   Vaping status: Never Used  Substance Use  Topics   Alcohol use: Yes    Comment: very rarely   Drug use: No       OPHTHALMIC EXAM:  Base Eye Exam     Visual Acuity (Snellen - Linear)       Right Left   Dist [place] 20/20 -2 20/200 -2   Dist ph   NI         Tonometry (Tonopen, 9:22 AM)       Right Left   Pressure 12 13         Pupils       Pupils Dark Light Shape React APD   Right PERRL  3 2 Round Brisk None   Left PERRL 3 2 Round Brisk None         Visual Fields       Left Right    Full Full         Extraocular Movement       Right Left    Full, Ortho Full, Ortho         Neuro/Psych     Oriented x3: Yes   Mood/Affect: Normal         Dilation     Both eyes: 2.5% Phenylephrine @ 9:23 AM           Slit Lamp and Fundus Exam     External Exam       Right Left   External  Periorbital edema -- improved         Slit Lamp Exam       Right Left   Lids/Lashes Dermatochalasis - upper lid Dermatochalasis - upper lid, lid erythema improved   Conjunctiva/Sclera white and quiet White and quiet, STK ST quad   Cornea trace PEE, mild tear film debris, well healed cataract wound 3+PEE, mild tear film debris, well healed cataract wound, inferior paracentral corneal haze   Anterior Chamber deep and clear deep and clear   Iris round and dilated round and dilated   Lens PC IOL in good position PC IOL in good position   Anterior Vitreous syneresis, asteroid hyalosis, PVD post vitrectomy         Fundus Exam       Right Left   Disc Pink and Sharp mild Pallor, Sharp rim, Compact   C/D Ratio 0.4 0.3   Macula flat, good foveal reflex, mild RPE mottling, No heme or edema Flat, Blunted foveal reflex, RPE mottling, ERM with striae greatest nasal mac, diffuse edema/cystic changes centrally and nasally - persistent, No heme   Vessels attenuated, Tortuous attenuated, Tortuous   Periphery Attached, operculated hole @ 1030 with mild surrounding pigment -- good laser surrounding, no SRF, no new RT/RD  Attached, Trace pockets of SRF superior to disc with fibrosis, Retina attached over buckle, good buckle height, good laser over buckle and around tears, PRE-OP: small tear with fibrosis @ 0200, bullous, temporal RD from 1230 to 0430           Refraction     Wearing Rx       Sphere Cylinder Axis Add   Right +1.00 +1.00 130 +2.75   Left +1.25 +0.75 017 +2.75           IMAGING AND PROCEDURES  Imaging and Procedures for 02/04/2023  OCT, Retina - OU - Both Eyes       Right Eye Quality was good. Central Foveal Thickness: 271. Progression has been stable. Findings include normal foveal contour, no IRF, no SRF.   Left Eye Quality was good. Central Foveal Thickness: 718. Progression has worsened. Findings include abnormal foveal contour, epiretinal membrane, intraretinal fluid, macular pucker, subretinal fluid (ERM with blunted foveal contour and mild pucker, persistent cystic changes/diffuse edema and central SRF-- slightly increased).   Notes *Images captured and stored on drive  Diagnosis / Impression:  OD: NFP; no IRF/SRF OS:ERM with blunted foveal contour and mild pucker, persistent cystic changes/diffuse edema and central SRF-- slightly increased  Clinical management:  See below  Abbreviations: NFP - Normal foveal profile. CME - cystoid macular edema. PED - pigment epithelial detachment. IRF - intraretinal fluid. SRF - subretinal fluid. EZ - ellipsoid zone.  ERM - epiretinal membrane. ORA - outer retinal atrophy. ORT - outer retinal tubulation. SRHM - subretinal hyper-reflective material. IRHM - intraretinal hyper-reflective material      Intravitreal Injection, Pharmacologic Agent - OS - Left Eye       Time Out 02/04/2023. 10:34 AM. Confirmed correct patient, procedure, site, and patient consented.   Anesthesia Topical anesthesia was used. Anesthetic medications included Lidocaine 2%, Proparacaine 0.5%.   Procedure Preparation included 5% betadine to ocular  surface, eyelid speculum. A (32g) needle was used.   Injection: 2 mg aflibercept 2 MG/0.05ML   Route: Intravitreal, Site: Left Eye   NDC: [id number], Lot: [id number], Expiration date: 03/15/2024, Waste: 0 mL   Post-op Post injection exam found visual acuity of at least counting fingers. The patient tolerated the procedure well. There were no complications. The patient received written and verbal post procedure care education.             ASSESSMENT/PLAN:    ICD-10-CM   1. Left retinal detachment  H33.22 OCT, Retina - OU - Both Eyes    2. Cystoid macular edema of left eye  H35.352 Intravitreal Injection, Pharmacologic Agent - OS - Left Eye    3. Epiretinal membrane (ERM) of left eye  H35.372     4. Retinal tear of right eye  H33.311     5. Essential hypertension  I10     6. Hypertensive retinopathy of both eyes  H35.033     7. 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 Rhegmatogenous retinal detachment with retinal hole, left eye - bullous, temporal, mac off detachment, onset of foveal involvement Tuesday, 03.14.23 by history - detached from 1230 to 430 oclock, fovea off, small tear at 0200 - s/p SBP + PPV/PFO/EL/FAX/14% C3F8 OS, 03.23.2023 - retina attached and in good position -- good buckle height and laser around breaks  - BCVA OS 20/200 - stable - OCT shows OS:ERM with blunted foveal contour and mild pucker, persistent cystic changes/diffuse edema and central SRF-- slightly increased - FA 12.18.23 shows OD Focal staining ST periphery corresponding to laser retinopexy, OS Mild Hyperfluorescence of disc, mild perifoveal staining SN to fovea--?petaloid -- suggestive of CME - s/p STK #1 (11.20.23) for CME, #2 (02.28.24), #3 (07.03.24) - s/p IVA OS #1 (05.01.24), #2 (05.29.24), #3 (06.26.24)  - s/p IVE OS #1 (07.24.24) - IOP OS 13 today -- h/o steroid response - continue Prolensa Qdaily OS for CME (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)  - continue holding PF(h/o steroid response)  - continue Brimonidine BID OS, Cosopt BID OS, and discontinue Latanoprost **discussed decreased efficacy / resistance to Avastin and potential benefit of switching medication**  - recommend IVE #2 OS today, 08.21.24, for persistent CME - pt wishes to proceed with injection - RBA of procedure discussed, questions answered - IVA informed consent obtained and signed, 05.01.24 (OS) - STK informed consent obtained and signed, 11.20.23 (OS) - IVE informed consent obtained and signed, 07.24.24 (OS) - see procedure note - Eylea authorized for 2024 - f/u  4 week - DFE, OCT, possible injection  3. Epiretinal membrane, left eye  - ERM with persistent cystic edema - OCT shows persistent ERM w/ cystic changes slightly increased - monitor for now, but discussed possibility of repeat PPV w/ membrane peel to remove ERM if vision fails to improve  4. Retinal hole OD  - operculated retinal  hole located at 1030 with mild surrounding pigment, no SRF - s/p laser retinopexy OD (03.16.23) -- good laser surrounding - continue to monitor  5,6. Hypertensive retinopathy OU - discussed importance of tight BP control - continue to monitor  7. Pseudophakia OU  - s/p CE/IOL OS (Dr. Zenaida Niece, 10.04.23), CE with IOL OD (11.01.23, Dr. Zenaida Niece)  - IOL in good position, doing well  - continue to monitor  8. Ocular hypertension OS  - likely steroid response  - tmax 33 on 01.29.24  - s/p SLT OS w/ Dr. Wynelle Link on 12.05.23  - IOP today: 13  - cont Latanoprost QHS OS (started 04.17.24),  Cosopt TID OS  Ophthalmic Meds Ordered this visit:  No orders of the defined types were placed in this encounter.    Return in about 4 weeks (around 03/04/2023) for f/u CME OS , DFE, OCT, Possible, IVE, OS.  There are no Patient Instructions on file for this visit.  Explained the diagnoses, plan, and follow up with the patient and they expressed understanding.   Patient expressed understanding of the importance of proper follow up care.   This document serves as a record of services personally performed by Karie Chimera, MD, PhD. It was created on their behalf by De Blanch, an ophthalmic technician. The creation of this record is the provider's dictation and/or activities during the visit.    Electronically signed by: De Blanch, OA, 02/04/23  10:44 AM  This document serves as a record of services personally performed by Karie Chimera, MD, PhD. It was created on their behalf by Laurey Morale, COT an ophthalmic technician. The creation of this record is the provider's dictation and/or activities during the visit.    Electronically signed by:  Charlette Caffey, COT  02/04/23 10:44 AM  Karie Chimera, M.D., Ph.D. Diseases & Surgery of the Retina and Vitreous [facility]    Abbreviations: M myopia (nearsighted); A astigmatism; H hyperopia (farsighted); P presbyopia; Mrx spectacle prescription;  CTL contact lenses; OD right eye; OS left eye; OU both eyes  XT exotropia; ET esotropia; PEK punctate epithelial keratitis; PEE punctate epithelial erosions; DES dry eye syndrome; MGD meibomian gland dysfunction; ATs artificial tears; PFAT's preservative free artificial tears; NSC nuclear sclerotic cataract; PSC posterior subcapsular cataract; ERM epi-retinal membrane; PVD posterior vitreous detachment; RD retinal detachment; DM diabetes mellitus; DR diabetic retinopathy; NPDR non-proliferative diabetic retinopathy; PDR proliferative diabetic retinopathy; CSME clinically significant macular edema; DME diabetic macular edema; dbh dot blot hemorrhages; CWS cotton wool spot; POAG primary open angle glaucoma; C/D cup-to-disc ratio; HVF humphrey visual field; GVF goldmann visual field; OCT optical coherence tomography; IOP intraocular pressure; BRVO Branch retinal vein occlusion; CRVO central retinal vein occlusion; CRAO central  retinal artery occlusion; BRAO branch retinal artery occlusion; RT retinal tear; SB scleral buckle; PPV pars plana vitrectomy; VH Vitreous hemorrhage; PRP panretinal laser photocoagulation; IVK intravitreal kenalog; VMT vitreomacular traction; MH Macular hole;  NVD neovascularization of the disc; NVE neovascularization elsewhere; AREDS age related eye disease study; ARMD age related macular degeneration; POAG primary open angle glaucoma; EBMD epithelial/anterior basement membrane dystrophy; ACIOL anterior chamber intraocular lens; IOL intraocular lens; PCIOL posterior chamber intraocular lens; Phaco/IOL phacoemulsification with intraocular lens placement; PRK photorefractive keratectomy; LASIK laser assisted in situ keratomileusis; HTN hypertension; DM diabetes mellitus; COPD chronic obstructive pulmonary disease

## 2023-02-04 ENCOUNTER — Ambulatory Visit (INDEPENDENT_AMBULATORY_CARE_PROVIDER_SITE_OTHER): Payer: Medicare HMO | Admitting: Ophthalmology

## 2023-02-04 ENCOUNTER — Encounter (INDEPENDENT_AMBULATORY_CARE_PROVIDER_SITE_OTHER): Payer: Self-pay | Admitting: Ophthalmology

## 2023-02-04 DIAGNOSIS — H35372 Puckering of macula, left eye: Secondary | ICD-10-CM

## 2023-02-04 DIAGNOSIS — I10 Essential (primary) hypertension: Secondary | ICD-10-CM | POA: Diagnosis not present

## 2023-02-04 DIAGNOSIS — H33311 Horseshoe tear of retina without detachment, right eye: Secondary | ICD-10-CM

## 2023-02-04 DIAGNOSIS — H3322 Serous retinal detachment, left eye: Secondary | ICD-10-CM | POA: Diagnosis not present

## 2023-02-04 DIAGNOSIS — H35033 Hypertensive retinopathy, bilateral: Secondary | ICD-10-CM | POA: Diagnosis not present

## 2023-02-04 DIAGNOSIS — Z961 Presence of intraocular lens: Secondary | ICD-10-CM | POA: Diagnosis not present

## 2023-02-04 DIAGNOSIS — H40052 Ocular hypertension, left eye: Secondary | ICD-10-CM

## 2023-02-04 DIAGNOSIS — H35352 Cystoid macular degeneration, left eye: Secondary | ICD-10-CM | POA: Diagnosis not present

## 2023-02-04 MED ORDER — BRIMONIDINE TARTRATE 0.2 % OP SOLN: OPHTHALMIC | 4 refills | Status: DC

## 2023-02-04 MED ORDER — AFLIBERCEPT 2MG/0.05ML IZ SOLN FOR KALEIDOSCOPE
2.0000 mg | INTRAVITREAL | Status: AC | PRN
  Administered 2023-02-04: 2 mg via INTRAVITREAL

## 2023-02-05 ENCOUNTER — Other Ambulatory Visit (INDEPENDENT_AMBULATORY_CARE_PROVIDER_SITE_OTHER): Payer: Self-pay | Admitting: Ophthalmology

## 2023-02-06 ENCOUNTER — Other Ambulatory Visit (INDEPENDENT_AMBULATORY_CARE_PROVIDER_SITE_OTHER): Payer: Self-pay

## 2023-02-06 MED ORDER — BROMFENAC SODIUM 0.07 % OP SOLN: 1.0000 [drp] | Freq: Every day | OPHTHALMIC | 3 refills | Status: AC

## 2023-02-13 ENCOUNTER — Other Ambulatory Visit (INDEPENDENT_AMBULATORY_CARE_PROVIDER_SITE_OTHER): Payer: Self-pay | Admitting: Ophthalmology

## 2023-02-17 ENCOUNTER — Other Ambulatory Visit (INDEPENDENT_AMBULATORY_CARE_PROVIDER_SITE_OTHER): Payer: Self-pay

## 2023-02-23 DIAGNOSIS — M810 Age-related osteoporosis without current pathological fracture: Secondary | ICD-10-CM | POA: Diagnosis not present

## 2023-02-23 DIAGNOSIS — I10 Essential (primary) hypertension: Secondary | ICD-10-CM | POA: Diagnosis not present

## 2023-02-23 DIAGNOSIS — Z6841 Body Mass Index (BMI) 40.0 and over, adult: Secondary | ICD-10-CM | POA: Diagnosis not present

## 2023-02-23 DIAGNOSIS — K219 Gastro-esophageal reflux disease without esophagitis: Secondary | ICD-10-CM | POA: Diagnosis not present

## 2023-02-23 DIAGNOSIS — R609 Edema, unspecified: Secondary | ICD-10-CM | POA: Diagnosis not present

## 2023-02-23 DIAGNOSIS — J45909 Unspecified asthma, uncomplicated: Secondary | ICD-10-CM | POA: Diagnosis not present

## 2023-02-23 DIAGNOSIS — E7800 Pure hypercholesterolemia, unspecified: Secondary | ICD-10-CM | POA: Diagnosis not present

## 2023-02-23 DIAGNOSIS — R7303 Prediabetes: Secondary | ICD-10-CM | POA: Diagnosis not present

## 2023-02-23 DIAGNOSIS — M1990 Unspecified osteoarthritis, unspecified site: Secondary | ICD-10-CM | POA: Diagnosis not present

## 2023-02-23 DIAGNOSIS — Z862 Personal history of diseases of the blood and blood-forming organs and certain disorders involving the immune mechanism: Secondary | ICD-10-CM | POA: Diagnosis not present

## 2023-02-23 DIAGNOSIS — J309 Allergic rhinitis, unspecified: Secondary | ICD-10-CM | POA: Diagnosis not present

## 2023-02-23 DIAGNOSIS — Z23 Encounter for immunization: Secondary | ICD-10-CM | POA: Diagnosis not present

## 2023-02-26 DIAGNOSIS — J45909 Unspecified asthma, uncomplicated: Secondary | ICD-10-CM | POA: Diagnosis not present

## 2023-02-26 DIAGNOSIS — I10 Essential (primary) hypertension: Secondary | ICD-10-CM | POA: Diagnosis not present

## 2023-02-26 DIAGNOSIS — F5081 Binge eating disorder: Secondary | ICD-10-CM | POA: Diagnosis not present

## 2023-02-26 DIAGNOSIS — E785 Hyperlipidemia, unspecified: Secondary | ICD-10-CM | POA: Diagnosis not present

## 2023-02-26 DIAGNOSIS — Z9189 Other specified personal risk factors, not elsewhere classified: Secondary | ICD-10-CM | POA: Diagnosis not present

## 2023-02-26 DIAGNOSIS — R600 Localized edema: Secondary | ICD-10-CM | POA: Diagnosis not present

## 2023-02-26 DIAGNOSIS — Z6841 Body Mass Index (BMI) 40.0 and over, adult: Secondary | ICD-10-CM | POA: Diagnosis not present

## 2023-02-26 DIAGNOSIS — I872 Venous insufficiency (chronic) (peripheral): Secondary | ICD-10-CM | POA: Diagnosis not present

## 2023-02-26 DIAGNOSIS — K219 Gastro-esophageal reflux disease without esophagitis: Secondary | ICD-10-CM | POA: Diagnosis not present

## 2023-02-26 DIAGNOSIS — R7303 Prediabetes: Secondary | ICD-10-CM | POA: Diagnosis not present

## 2023-03-03 NOTE — Progress Notes (Signed)
[facility] - Clinic Note  03/04/2023    CHIEF COMPLAINT Patient presents for Retina Follow Up   HISTORY OF PRESENT ILLNESS: Alicia Miranda is a 67 y.o. female who presents to the clinic today for:   HPI     Retina Follow Up   Patient presents with  Other.  In left eye.  Severity is moderate.  Duration of 4 weeks.  Since onset it is stable.  I, the attending physician,  performed the HPI with the patient and updated documentation appropriately.        Comments   Pt here for 4 wk ret f/u CME OS. Pt states VA might be a bit blurrier? Doesn't seem much better. Pt reports doing the brim and cosopt BID OS, Prolensa every day OS, and using ATS multiple times a day.       Last edited by Rennis Chris, MD on 03/04/2023  4:19 PM.     Patient states   Referring physician: Tally Joe, MD [phone] [address] [place],  [place] [address]  HISTORICAL INFORMATION:   Selected notes from the MEDICAL RECORD NUMBER Referred by Dr. Illene Labrador for possible RD OS LEE: 03/16/02023 Ocular Hx-  PMH- HTN    CURRENT MEDICATIONS: Current Outpatient Medications (Ophthalmic Drugs)  Medication Sig   brimonidine (ALPHAGAN) 0.2 % ophthalmic solution INSTILL 1 DROP INTO LEFT EYE THREE TIMES DAILY   Bromfenac Sodium (PROLENSA) 0.07 % SOLN Place 1 drop into the left eye daily. PT NEEDS 2 BOTTLES DISPENSED AT A TIME-CALL IF COUPON IS NEEDED   dorzolamide-timolol (COSOPT) 2-0.5 % ophthalmic solution Place 1 drop into the left eye in the morning, at noon, and at bedtime.   erythromycin ophthalmic ointment Place 1 Application into the left eye at bedtime.   latanoprost (XALATAN) 0.005 % ophthalmic solution Place 1 drop into the left eye at bedtime.   LOTEPREDNOL ETABONATE OP Place 1 drop into the right eye 4 (four) times daily.   prednisoLONE acetate (PRED FORTE) 1 % ophthalmic suspension INSTILL 1 DROP INTO LEFT EYE 4 TIMES DAILY   prednisoLONE acetate (PRED FORTE) 1 %  ophthalmic suspension Place 1 drop into the left eye 4 (four) times daily.   dorzolamide (TRUSOPT) 2 % ophthalmic solution Place 1 drop into the left eye 3 (three) times daily. (Patient not taking: Reported on 03/04/2023)   No current facility-administered medications for this visit. (Ophthalmic Drugs)   Current Outpatient Medications (Other)  Medication Sig   APPLE CIDER VINEGAR PO Take 2 tablets by mouth daily. gummies   Cholecalciferol (VITAMIN D3) 10 MCG (400 UNIT) tablet Take 400 Units by mouth daily.   CINNAMON PO Take 1,000 mg by mouth every other day.   COLLAGEN PO Take 3 capsules by mouth daily.   Denosumab (PROLIA [place]) Inject 60 mg into the skin every 6 (six) months.   FEROSUL 325 (65 Fe) MG tablet Take 325 mg by mouth daily.   fexofenadine (ALLEGRA) 180 MG tablet Take 180 mg by mouth daily.   fluticasone (FLONASE) 50 MCG/ACT nasal spray Place 1 spray into both nostrils daily.   furosemide (LASIX) 40 MG tablet Take 40 mg by mouth 2 (two) times daily.   Glucosamine HCl (GLUCOSAMINE PO) Take 2 tablets by mouth daily.   losartan (COZAAR) 50 MG tablet Take 100 mg by mouth daily.   Magnesium 400 MG CAPS Take 400 mg by mouth daily.   Melatonin 5 MG CAPS Take 10 mg by mouth at  bedtime.   metformin (FORTAMET) 500 MG (OSM) 24 hr tablet Take 500 mg by mouth daily with breakfast.   metformin (FORTAMET) 500 MG (OSM) 24 hr tablet    pantoprazole (PROTONIX) 40 MG tablet Take 40 mg by mouth 2 (two) times daily.   rosuvastatin (CRESTOR) 20 MG tablet Take 20 mg by mouth daily.   TURMERIC PO Take 2 tablets by mouth daily.   No current facility-administered medications for this visit. (Other)   REVIEW OF SYSTEMS: ROS   Positive for: Musculoskeletal, Cardiovascular, Eyes, Respiratory Negative for: Constitutional, Gastrointestinal, Neurological, Skin, Genitourinary, HENT, Endocrine, Psychiatric, Allergic/Imm, Heme/Lymph Last edited by Thompson Grayer, COT on 03/04/2023  1:33 PM.      ALLERGIES Allergies  Allergen Reactions   Codeine Itching   PAST MEDICAL HISTORY Past Medical History:  Diagnosis Date   Allergy    Arthritis    legs   Asthma    GERD (gastroesophageal reflux disease)    Hyperlipidemia    Hypertension    Past Surgical History:  Procedure Laterality Date   ABDOMINAL HYSTERECTOMY     CHOLECYSTECTOMY     COLONOSCOPY     GAS INSERTION Left 09/05/2021   Procedure: INSERTION OF GAS - C3F8;  Surgeon: Rennis Chris, MD;  Location: [facility] OR;  Service: Ophthalmology;  Laterality: Left;   GAS/FLUID EXCHANGE Left 09/05/2021   Procedure: GAS/FLUID EXCHANGE;  Surgeon: Rennis Chris, MD;  Location: [facility] OR;  Service: Ophthalmology;  Laterality: Left;   PARS PLANA VITRECTOMY Left 09/05/2021   Procedure: PARS PLANA VITRECTOMY WITH 25 GAUGE;  Surgeon: Rennis Chris, MD;  Location: [facility] OR;  Service: Ophthalmology;  Laterality: Left;   PERFLUORONE INJECTION Left 09/05/2021   Procedure: PERFLUORON INJECTION;  Surgeon: Rennis Chris, MD;  Location: [facility] OR;  Service: Ophthalmology;  Laterality: Left;   PHOTOCOAGULATION WITH LASER Left 09/05/2021   Procedure: PHOTOCOAGULATION WITH LASER;  Surgeon: Rennis Chris, MD;  Location: [facility] OR;  Service: Ophthalmology;  Laterality: Left;   SCLERAL BUCKLE Left 09/05/2021   Procedure: SCLERAL BUCKLE;  Surgeon: Rennis Chris, MD;  Location: [facility] OR;  Service: Ophthalmology;  Laterality: Left;   UPPER GASTROINTESTINAL ENDOSCOPY     FAMILY HISTORY Family History  Problem Relation Age of Onset   Retinal detachment Sister    Diabetes Sister    Glaucoma Brother    Diabetes Brother    Colon cancer Brother        dx at 102   Esophageal cancer Neg Hx    Rectal cancer Neg Hx    Stomach cancer Neg Hx    SOCIAL HISTORY Social History   Tobacco Use   Smoking status: Former   Smokeless tobacco: Never   Tobacco comments:    quit smoking in 2002 or 2003  Vaping Use   Vaping status: Never Used  Substance Use Topics   Alcohol use: Yes     Comment: very rarely   Drug use: No       OPHTHALMIC EXAM:  Base Eye Exam     Visual Acuity (Snellen - Linear)       Right Left   Dist [place] 20/20 -2 20/200 -2         Tonometry (Tonopen, 1:39 PM)       Right Left   Pressure 15 13         Pupils       Pupils Dark Light Shape React APD   Right PERRL 3 2 Round Brisk None   Left PERRL 3 2  Round Brisk None         Visual Fields (Counting fingers)       Left Right    Full Full         Extraocular Movement       Right Left    Full, Ortho Full, Ortho         Neuro/Psych     Oriented x3: Yes   Mood/Affect: Normal         Dilation     Both eyes: 1.0% Mydriacyl, 2.5% Phenylephrine @ 1:40 PM           Slit Lamp and Fundus Exam     External Exam       Right Left   External  Periorbital edema -- improved         Slit Lamp Exam       Right Left   Lids/Lashes Dermatochalasis - upper lid Dermatochalasis - upper lid, lid erythema improved   Conjunctiva/Sclera white and quiet White and quiet, STK ST quad   Cornea trace PEE, mild tear film debris, well healed cataract wound 3+PEE, mild tear film debris, well healed cataract wound, inferior paracentral corneal haze   Anterior Chamber deep and clear deep and clear   Iris round and dilated round and dilated   Lens PC IOL in good position PC IOL in good position   Anterior Vitreous syneresis, asteroid hyalosis, PVD post vitrectomy         Fundus Exam       Right Left   Disc Pink and Sharp mild Pallor, Sharp rim, Compact   C/D Ratio 0.4 0.3   Macula flat, good foveal reflex, mild RPE mottling, No heme or edema Flat, Blunted foveal reflex, RPE mottling, ERM with striae greatest nasal mac, diffuse edema/cystic changes centrally and nasally - persistent, No heme   Vessels attenuated, Tortuous attenuated, Tortuous   Periphery Attached, operculated hole @ 1030 with mild surrounding pigment -- good laser surrounding, no SRF, no new RT/RD Attached,  Trace pockets of SRF superior to disc with fibrosis, focal fibrosis at 0600 posterior to buckle, Retina attached over buckle, good buckle height, good laser over buckle and around tears, PRE-OP: small tear with fibrosis @ 0200, bullous, temporal RD from 1230 to 0430           IMAGING AND PROCEDURES  Imaging and Procedures for 03/04/2023  OCT, Retina - OU - Both Eyes       Right Eye Quality was good. Central Foveal Thickness: 275. Progression has been stable. Findings include normal foveal contour, no IRF, no SRF.   Left Eye Quality was good. Central Foveal Thickness: 724. Progression has worsened. Findings include abnormal foveal contour, epiretinal membrane, intraretinal fluid, macular pucker, subretinal fluid (ERM with blunted foveal contour and mild pucker, persistent cystic changes/diffuse edema, central SRF slightly increased).   Notes *Images captured and stored on drive  Diagnosis / Impression:  OD: NFP; no IRF/SRF OS: ERM with blunted foveal contour and mild pucker, persistent cystic changes/diffuse edema, central SRF slightly increased  Clinical management:  See below  Abbreviations: NFP - Normal foveal profile. CME - cystoid macular edema. PED - pigment epithelial detachment. IRF - intraretinal fluid. SRF - subretinal fluid. EZ - ellipsoid zone. ERM - epiretinal membrane. ORA - outer retinal atrophy. ORT - outer retinal tubulation. SRHM - subretinal hyper-reflective material. IRHM - intraretinal hyper-reflective material      Intravitreal Injection, Pharmacologic Agent - OS - Left Eye  Time Out 03/04/2023. 2:32 PM. Confirmed correct patient, procedure, site, and patient consented.   Anesthesia Topical anesthesia was used. Anesthetic medications included Lidocaine 2%, Proparacaine 0.5%.   Procedure Preparation included 5% betadine to ocular surface, eyelid speculum. A (32g) needle was used.   Injection: 2 mg aflibercept 2 MG/0.05ML   Route: Intravitreal,  Site: Left Eye   NDC: [id number], Lot: [id number], Expiration date: 05/15/2024, Waste: 0 mL   Post-op Post injection exam found visual acuity of at least counting fingers. The patient tolerated the procedure well. There were no complications. The patient received written and verbal post procedure care education.            ASSESSMENT/PLAN:    ICD-10-CM   1. Left retinal detachment  H33.22     2. Cystoid macular edema of left eye  H35.352 Intravitreal Injection, Pharmacologic Agent - OS - Left Eye    aflibercept (EYLEA) SOLN 2 mg    3. Epiretinal membrane (ERM) of left eye  H35.372 OCT, Retina - OU - Both Eyes    4. Retinal tear of right eye  H33.311     5. Essential hypertension  I10     6. Hypertensive retinopathy of both eyes  H35.033     7. 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 Rhegmatogenous retinal detachment with retinal hole, left eye - bullous, temporal, mac off detachment, onset of foveal involvement Tuesday, 03.14.23 by history - detached from 1230 to 430 oclock, fovea off, small tear at 0200 - s/p SBP + PPV/PFO/EL/FAX/14% C3F8 OS, 03.23.2023 - retina attached and in good position -- good buckle height and laser around breaks  - BCVA OS 20/200 - stable - OCT shows OS: ERM with blunted foveal contour and mild pucker, persistent cystic changes/diffuse edema, central SRF slightly increased - FA 12.18.23 shows OD Focal staining ST periphery corresponding to laser retinopexy, OS Mild Hyperfluorescence of disc, mild perifoveal staining SN to fovea--?petaloid -- suggestive of CME - s/p STK #1 (11.20.23) for CME, #2 (02.28.24), #3 (07.03.24) - s/p IVA OS #1 (05.01.24), #2 (05.29.24), #3 (06.26.24) -- IVA resistance  - s/p IVE OS #1 (07.24.24), #2 (08.21.24) - IOP OS 13 today -- h/o steroid response - continue Prolensa Qdaily OS for CME (decreased from QID due to 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)  - continue holding PF (h/o  steroid response)  - continue Brimonidine BID OS, Cosopt BID OS - discontinue Latanoprost due to possible contribution to CME - recommend IVE #3 OS today, 09.18.24, for persistent CME - pt wishes to proceed with injection - RBA of procedure discussed, questions answered - IVE informed consent obtained and signed, 07.24.24 (OS) - see procedure note - Eylea authorized for 2024 - f/u 4 week - DFE, OCT, possible injection  3. Epiretinal membrane, left eye  - ERM with persistent cystic edema - OCT shows persistent ERM w/ cystic changes slightly increased - monitor for now, but discussed possibility of repeat PPV w/ membrane peel to remove ERM if vision fails to improve  4. Retinal hole OD  - operculated retinal hole located at 1030 with mild surrounding pigment, no SRF - s/p laser retinopexy OD (03.16.23) -- good laser surrounding - continue to monitor  5,6. Hypertensive retinopathy OU - discussed importance of tight BP control - continue to monitor  7. Pseudophakia OU  - s/p CE/IOL OS (Dr. Zenaida Niece, 10.04.23), CE with IOL OD (11.01.23, Dr. Zenaida Niece)  -  IOL in good position, doing well  - continue to monitor  8. Ocular hypertension OS  - likely steroid response  - tmax 33 on 01.29.24  - s/p SLT OS w/ Dr. Wynelle Link on 12.05.23  - IOP today: 13  - continue Brimonidine BID OS, Cosopt BID OS - discontinue Latanoprost due to possible contribution to CME  Ophthalmic Meds Ordered this visit:  Meds ordered this encounter  Medications   aflibercept (EYLEA) SOLN 2 mg     Return in about 4 weeks (around 04/01/2023) for f/u CME OS, DFE, OCT.  There are no Patient Instructions on file for this visit.  Explained the diagnoses, plan, and follow up with the patient and they expressed understanding.  Patient expressed understanding of the importance of proper follow up care.   This document serves as a record of services personally performed by Karie Chimera, MD, PhD. It was created on their behalf by  Glee Arvin. Manson Passey, OA an ophthalmic technician. The creation of this record is the provider's dictation and/or activities during the visit.    Electronically signed by: Glee Arvin. Manson Passey, OA 03/04/23 4:21 PM  Karie Chimera, M.D., Ph.D. Diseases & Surgery of the Retina and Vitreous [facility]  I have reviewed the above documentation for accuracy and completeness, and I agree with the above. Karie Chimera, M.D., Ph.D. 03/04/23 4:22 PM  Abbreviations: M myopia (nearsighted); A astigmatism; H hyperopia (farsighted); P presbyopia; Mrx spectacle prescription;  CTL contact lenses; OD right eye; OS left eye; OU both eyes  XT exotropia; ET esotropia; PEK punctate epithelial keratitis; PEE punctate epithelial erosions; DES dry eye syndrome; MGD meibomian gland dysfunction; ATs artificial tears; PFAT's preservative free artificial tears; NSC nuclear sclerotic cataract; PSC posterior subcapsular cataract; ERM epi-retinal membrane; PVD posterior vitreous detachment; RD retinal detachment; DM diabetes mellitus; DR diabetic retinopathy; NPDR non-proliferative diabetic retinopathy; PDR proliferative diabetic retinopathy; CSME clinically significant macular edema; DME diabetic macular edema; dbh dot blot hemorrhages; CWS cotton wool spot; POAG primary open angle glaucoma; C/D cup-to-disc ratio; HVF humphrey visual field; GVF goldmann visual field; OCT optical coherence tomography; IOP intraocular pressure; BRVO Branch retinal vein occlusion; CRVO central retinal vein occlusion; CRAO central retinal artery occlusion; BRAO branch retinal artery occlusion; RT retinal tear; SB scleral buckle; PPV pars plana vitrectomy; VH Vitreous hemorrhage; PRP panretinal laser photocoagulation; IVK intravitreal kenalog; VMT vitreomacular traction; MH Macular hole;  NVD neovascularization of the disc; NVE neovascularization elsewhere; AREDS age related eye disease study; ARMD age related macular degeneration; POAG  primary open angle glaucoma; EBMD epithelial/anterior basement membrane dystrophy; ACIOL anterior chamber intraocular lens; IOL intraocular lens; PCIOL posterior chamber intraocular lens; Phaco/IOL phacoemulsification with intraocular lens placement; PRK photorefractive keratectomy; LASIK laser assisted in situ keratomileusis; HTN hypertension; DM diabetes mellitus; COPD chronic obstructive pulmonary disease

## 2023-03-04 ENCOUNTER — Encounter (INDEPENDENT_AMBULATORY_CARE_PROVIDER_SITE_OTHER): Payer: Self-pay | Admitting: Ophthalmology

## 2023-03-04 ENCOUNTER — Ambulatory Visit (INDEPENDENT_AMBULATORY_CARE_PROVIDER_SITE_OTHER): Payer: Medicare HMO | Admitting: Ophthalmology

## 2023-03-04 DIAGNOSIS — H33311 Horseshoe tear of retina without detachment, right eye: Secondary | ICD-10-CM | POA: Diagnosis not present

## 2023-03-04 DIAGNOSIS — H35352 Cystoid macular degeneration, left eye: Secondary | ICD-10-CM | POA: Diagnosis not present

## 2023-03-04 DIAGNOSIS — H35033 Hypertensive retinopathy, bilateral: Secondary | ICD-10-CM | POA: Diagnosis not present

## 2023-03-04 DIAGNOSIS — H35372 Puckering of macula, left eye: Secondary | ICD-10-CM | POA: Diagnosis not present

## 2023-03-04 DIAGNOSIS — Z961 Presence of intraocular lens: Secondary | ICD-10-CM

## 2023-03-04 DIAGNOSIS — H40052 Ocular hypertension, left eye: Secondary | ICD-10-CM

## 2023-03-04 DIAGNOSIS — H3322 Serous retinal detachment, left eye: Secondary | ICD-10-CM

## 2023-03-04 DIAGNOSIS — I10 Essential (primary) hypertension: Secondary | ICD-10-CM

## 2023-03-04 MED ORDER — AFLIBERCEPT 2MG/0.05ML IZ SOLN FOR KALEIDOSCOPE
2.0000 mg | INTRAVITREAL | Status: AC | PRN
  Administered 2023-03-04: 2 mg via INTRAVITREAL

## 2023-03-16 DIAGNOSIS — M810 Age-related osteoporosis without current pathological fracture: Secondary | ICD-10-CM | POA: Diagnosis not present

## 2023-03-26 NOTE — Progress Notes (Signed)
[facility] - Clinic Note  04/01/2023    CHIEF COMPLAINT Patient presents for Retina Follow Up   HISTORY OF PRESENT ILLNESS: Alicia Miranda is a 67 y.o. female who presents to the clinic today for:   HPI     Retina Follow Up   Patient presents with  Other.  In left eye.  This started 4 weeks ago.  I, the attending physician,  performed the HPI with the patient and updated documentation appropriately.        Comments   Patient here for 4 weeks retina follow up for CME OS. Patient states vision may be better. Some days better. Other days the same. No eye pain.       Last edited by Rennis Chris, MD on 04/01/2023 11:26 PM.       Referring physician: Tally Joe, MD [phone] [place],  [place] [address]  HISTORICAL INFORMATION:   Selected notes from the MEDICAL RECORD NUMBER Referred by Dr. Illene Labrador for possible RD OS LEE: 03/16/02023 Ocular Hx-  PMH- HTN    CURRENT MEDICATIONS: Current Outpatient Medications (Ophthalmic Drugs)  Medication Sig   brimonidine (ALPHAGAN) 0.2 % ophthalmic solution INSTILL 1 DROP INTO LEFT EYE THREE TIMES DAILY   Bromfenac Sodium (PROLENSA) 0.07 % SOLN Place 1 drop into the left eye daily. PT NEEDS 2 BOTTLES DISPENSED AT A TIME-CALL IF COUPON IS NEEDED   Bromfenac Sodium 0.09 % SOLN Place 1 drop into the left eye 4 (four) times daily.   dorzolamide-timolol (COSOPT) 2-0.5 % ophthalmic solution Place 1 drop into the left eye in the morning, at noon, and at bedtime.   erythromycin ophthalmic ointment Place 1 Application into the left eye at bedtime.   latanoprost (XALATAN) 0.005 % ophthalmic solution Place 1 drop into the left eye at bedtime.   LOTEPREDNOL ETABONATE OP Place 1 drop into the right eye 4 (four) times daily.   prednisoLONE acetate (PRED FORTE) 1 % ophthalmic suspension INSTILL 1 DROP INTO LEFT EYE 4 TIMES DAILY   prednisoLONE acetate (PRED FORTE) 1 % ophthalmic suspension Place 1 drop into  the left eye 4 (four) times daily.   dorzolamide (TRUSOPT) 2 % ophthalmic solution Place 1 drop into the left eye 3 (three) times daily. (Patient not taking: Reported on 03/04/2023)   No current facility-administered medications for this visit. (Ophthalmic Drugs)   Current Outpatient Medications (Other)  Medication Sig   APPLE CIDER VINEGAR PO Take 2 tablets by mouth daily. gummies   Cholecalciferol (VITAMIN D3) 10 MCG (400 UNIT) tablet Take 400 Units by mouth daily.   CINNAMON PO Take 1,000 mg by mouth every other day.   COLLAGEN PO Take 3 capsules by mouth daily.   Denosumab (PROLIA Royal) Inject 60 mg into the skin every 6 (six) months.   FEROSUL 325 (65 Fe) MG tablet Take 325 mg by mouth daily.   fexofenadine (ALLEGRA) 180 MG tablet Take 180 mg by mouth daily.   fluticasone (FLONASE) 50 MCG/ACT nasal spray Place 1 spray into both nostrils daily.   furosemide (LASIX) 40 MG tablet Take 40 mg by mouth 2 (two) times daily.   Glucosamine HCl (GLUCOSAMINE PO) Take 2 tablets by mouth daily.   losartan (COZAAR) 50 MG tablet Take 100 mg by mouth daily.   Magnesium 400 MG CAPS Take 400 mg by mouth daily.   Melatonin 5 MG CAPS Take 10 mg by mouth at bedtime.   metformin (FORTAMET) 500 MG (OSM)  24 hr tablet Take 500 mg by mouth daily with breakfast.   metformin (FORTAMET) 500 MG (OSM) 24 hr tablet    pantoprazole (PROTONIX) 40 MG tablet Take 40 mg by mouth 2 (two) times daily.   rosuvastatin (CRESTOR) 20 MG tablet Take 20 mg by mouth daily.   Semaglutide-Weight Management (WEGOVY) 1 MG/0.5ML SOAJ Inject 1 mg into the skin once a week.   TURMERIC PO Take 2 tablets by mouth daily.   No current facility-administered medications for this visit. (Other)   REVIEW OF SYSTEMS: ROS   Positive for: Musculoskeletal, Cardiovascular, Eyes, Respiratory Negative for: Constitutional, Gastrointestinal, Neurological, Skin, Genitourinary, HENT, Endocrine, Psychiatric, Allergic/Imm, Heme/Lymph Last edited by  Laddie Aquas, COA on 04/01/2023  1:21 PM.      ALLERGIES Allergies  Allergen Reactions   Codeine Itching   PAST MEDICAL HISTORY Past Medical History:  Diagnosis Date   Allergy    Arthritis    legs   Asthma    GERD (gastroesophageal reflux disease)    Hyperlipidemia    Hypertension    Past Surgical History:  Procedure Laterality Date   ABDOMINAL HYSTERECTOMY     CHOLECYSTECTOMY     COLONOSCOPY     GAS INSERTION Left 09/05/2021   Procedure: INSERTION OF GAS - C3F8;  Surgeon: Rennis Chris, MD;  Location: [facility] OR;  Service: Ophthalmology;  Laterality: Left;   GAS/FLUID EXCHANGE Left 09/05/2021   Procedure: GAS/FLUID EXCHANGE;  Surgeon: Rennis Chris, MD;  Location: [facility] OR;  Service: Ophthalmology;  Laterality: Left;   PARS PLANA VITRECTOMY Left 09/05/2021   Procedure: PARS PLANA VITRECTOMY WITH 25 GAUGE;  Surgeon: Rennis Chris, MD;  Location: [facility] OR;  Service: Ophthalmology;  Laterality: Left;   PERFLUORONE INJECTION Left 09/05/2021   Procedure: PERFLUORON INJECTION;  Surgeon: Rennis Chris, MD;  Location: [facility] OR;  Service: Ophthalmology;  Laterality: Left;   PHOTOCOAGULATION WITH LASER Left 09/05/2021   Procedure: PHOTOCOAGULATION WITH LASER;  Surgeon: Rennis Chris, MD;  Location: [facility] OR;  Service: Ophthalmology;  Laterality: Left;   SCLERAL BUCKLE Left 09/05/2021   Procedure: SCLERAL BUCKLE;  Surgeon: Rennis Chris, MD;  Location: [facility] OR;  Service: Ophthalmology;  Laterality: Left;   UPPER GASTROINTESTINAL ENDOSCOPY     FAMILY HISTORY Family History  Problem Relation Age of Onset   Retinal detachment Sister    Diabetes Sister    Glaucoma Brother    Diabetes Brother    Colon cancer Brother        dx at 19   Esophageal cancer Neg Hx    Rectal cancer Neg Hx    Stomach cancer Neg Hx    SOCIAL HISTORY Social History   Tobacco Use   Smoking status: Former   Smokeless tobacco: Never   Tobacco comments:    quit smoking in 2002 or 2003  Vaping Use   Vaping status: Never  Used  Substance Use Topics   Alcohol use: Yes    Comment: very rarely   Drug use: No       OPHTHALMIC EXAM:  Base Eye Exam     Visual Acuity (Snellen - Linear)       Right Left   Dist [place] 20/20 -2 20/200   Dist ph [place]  NI         Tonometry (Tonopen, 1:18 PM)       Right Left   Pressure 13 12         Pupils       Dark Light Shape React APD  Right 3 2 Round Brisk None   Left 3 2 Round Brisk None         Visual Fields (Counting fingers)       Left Right    Full Full         Extraocular Movement       Right Left    Full, Ortho Full, Ortho         Neuro/Psych     Oriented x3: Yes   Mood/Affect: Normal         Dilation     Both eyes: 1.0% Mydriacyl, 2.5% Phenylephrine @ 1:18 PM           Slit Lamp and Fundus Exam     External Exam       Right Left   External  Periorbital edema -- improved         Slit Lamp Exam       Right Left   Lids/Lashes Dermatochalasis - upper lid Dermatochalasis - upper lid, lid erythema improved   Conjunctiva/Sclera white and quiet White and quiet, STK ST quad   Cornea trace PEE, mild tear film debris, well healed cataract wound trace PEE, mild tear film debris, well healed cataract wound, inferior paracentral corneal haze   Anterior Chamber deep and clear deep and clear   Iris round and dilated round and dilated   Lens PC IOL in good position PC IOL in good position   Anterior Vitreous syneresis, asteroid hyalosis, PVD post vitrectomy         Fundus Exam       Right Left   Disc Pink and Sharp mild Pallor, Sharp rim, Compact   C/D Ratio 0.4 0.3   Macula flat, good foveal reflex, mild RPE mottling, No heme or edema Flat, Blunted foveal reflex, RPE mottling, ERM with striae greatest nasal mac, diffuse edema/cystic changes centrally and nasally - persistent, No heme   Vessels attenuated, Tortuous attenuated, Tortuous   Periphery Attached, operculated hole @ 1030 with mild surrounding pigment -- good  laser surrounding, no SRF, no new RT/RD Attached, Trace pockets of SRF superior to disc with fibrosis, focal fibrosis at 0600 posterior to buckle, Retina attached over buckle, good buckle height, good laser over buckle and around tears, PRE-OP: small tear with fibrosis @ 0200, bullous, temporal RD from 1230 to 0430           Refraction     Wearing Rx       Sphere Cylinder Axis Add   Right +1.00 +1.00 130 +2.75   Left +1.25 +0.75 017 +2.75           IMAGING AND PROCEDURES  Imaging and Procedures for 04/01/2023  OCT, Retina - OU - Both Eyes       Right Eye Quality was good. Central Foveal Thickness: 277. Progression has been stable. Findings include normal foveal contour, no IRF, no SRF.   Left Eye Quality was good. Central Foveal Thickness: 725. Progression has been stable. Findings include abnormal foveal contour, epiretinal membrane, intraretinal fluid, macular pucker, subretinal fluid (ERM with blunted foveal contour and mild pucker, persistent cystic changes/diffuse edema and central SRF ).   Notes *Images captured and stored on drive  Diagnosis / Impression:  OD: NFP; no IRF/SRF OS: ERM with blunted foveal contour and mild pucker; persistent cystic changes/diffuse edema, central SRF   Clinical management:  See below  Abbreviations: NFP - Normal foveal profile. CME - cystoid macular edema. PED - pigment epithelial detachment. IRF -  intraretinal fluid. SRF - subretinal fluid. EZ - ellipsoid zone. ERM - epiretinal membrane. ORA - outer retinal atrophy. ORT - outer retinal tubulation. SRHM - subretinal hyper-reflective material. IRHM - intraretinal hyper-reflective material      Intravitreal Injection, Pharmacologic Agent - OS - Left Eye       Time Out 04/01/2023. 2:10 PM. Confirmed correct patient, procedure, site, and patient consented.   Anesthesia Topical anesthesia was used. Anesthetic medications included Lidocaine 2%, Proparacaine 0.5%.    Procedure Preparation included 5% betadine to ocular surface, eyelid speculum. A (32g) needle was used.   Injection: 2 mg aflibercept 2 MG/0.05ML   Route: Intravitreal, Site: Left Eye   NDC: L6038910, Lot: [id number], Expiration date: 05/15/2024, Waste: 0 mL   Post-op Post injection exam found visual acuity of at least counting fingers. The patient tolerated the procedure well. There were no complications. The patient received written and verbal post procedure care education.             ASSESSMENT/PLAN:    ICD-10-CM   1. Left retinal detachment  H33.22     2. Cystoid macular edema of left eye  H35.352 OCT, Retina - OU - Both Eyes    Intravitreal Injection, Pharmacologic Agent - OS - Left Eye    aflibercept (EYLEA) SOLN 2 mg    3. Epiretinal membrane (ERM) of left eye  H35.372 OCT, Retina - OU - Both Eyes    4. Retinal tear of right eye  H33.311     5. Essential hypertension  I10     6. Hypertensive retinopathy of both eyes  H35.033     7. 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 Rhegmatogenous retinal detachment with retinal hole, left eye - bullous, temporal, mac off detachment, onset of foveal involvement Tuesday, 03.14.23 by history - detached from 1230 to 430 oclock, fovea off, small tear at 0200 - s/p SBP + PPV/PFO/EL/FAX/14% C3F8 OS, 03.23.2023 - retina attached and in good position -- good buckle height and laser around breaks  - BCVA OS 20/200 - stable - OCT shows OS: ERM with blunted foveal contour and mild pucker, persistent cystic changes/diffuse edema, central SRF -- stable - FA 12.18.23 shows OD Focal staining ST periphery corresponding to laser retinopexy, OS Mild Hyperfluorescence of disc, mild perifoveal staining SN to fovea--?petaloid -- suggestive of CME - s/p STK #1 (11.20.23) for CME, #2 (02.28.24), #3 (07.03.24) - s/p IVA OS #1 (05.01.24), #2 (05.29.24), #3 (06.26.24) -- IVA  resistance  - s/p IVE OS #1 (07.24.24), #2 (08.21.24), #3 (09.18.24) - IOP OS 12 today -- h/o steroid response - continue Prolensa Qdaily OS for CME (decreased from QID due to 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 Epiretinal membrane, left eye  - ERM with persistent cystic edema - OCT shows persistent ERM w/ cystic changes slightly increased - monitor for now, but discussed possibility of repeat PPV w/ membrane peel to remove ERM if vision fails to improve  4. Retinal hole OD  - operculated retinal hole located at 1030 with mild surrounding pigment, no SRF - s/p laser retinopexy OD (03.16.23) -- good laser surrounding - continue to monitor  5,6. Hypertensive retinopathy OU - discussed importance of tight BP control - continue to monitor  7. Pseudophakia OU  - s/p CE/IOL OS (Dr. Zenaida Niece, 10.04.23), CE with IOL OD (11.01.23, Dr. Zenaida Niece)  - IOL in good position, doing well  - continue to monitor  8. Ocular hypertension OS  - likely steroid response  - tmax 33 on 01.29.24  - s/p SLT OS w/ Dr. Wynelle Link on 12.05.23  - IOP today: 12  - continue Brimonidine BID OS, Cosopt BID OS - discontinue Latanoprost due to possible contribution to CME  Ophthalmic Meds Ordered this visit:  Meds ordered this encounter  Medications   Bromfenac Sodium 0.09 % SOLN    Sig: Place 1 drop into the left eye 4 (four) times daily.    Dispense:  1.7 mL    Refill:  10   aflibercept (EYLEA) SOLN 2 mg     Return in about 4 weeks (around 04/29/2023) for f/u RD OS, DFE,  OCT.  There are no Patient Instructions on file for this visit.  Explained the diagnoses, plan, and follow up with the patient and they expressed understanding.  Patient expressed understanding of the importance of proper follow up care.   This document serves as a record of services personally performed by Karie Chimera, MD, PhD. It was created on their behalf by De Blanch, an ophthalmic technician. The creation of this record is the provider's dictation and/or activities during the visit.    Electronically signed by: De Blanch, OA, 04/01/23  11:28 PM  This document serves as a record of services personally performed by Karie Chimera, MD, PhD. It was created on their behalf by Glee Arvin. Manson Passey, OA an ophthalmic technician. The creation of this record is the provider's dictation and/or activities during the visit.    Electronically signed by: Glee Arvin. Manson Passey, OA 04/01/23 11:28 PM  Karie Chimera, M.D., Ph.D. Diseases & Surgery of the Retina and Vitreous [facility]  I have reviewed the above documentation for accuracy and completeness, and I agree with the above. Karie Chimera, M.D., Ph.D. 04/01/23 11:32 PM  Abbreviations: M myopia (nearsighted); A astigmatism; H hyperopia (farsighted); P presbyopia; Mrx spectacle prescription;  CTL contact lenses; OD right eye; OS left eye; OU both eyes  XT exotropia; ET esotropia; PEK punctate epithelial keratitis; PEE punctate epithelial erosions; DES dry eye syndrome; MGD meibomian gland dysfunction; ATs artificial tears; PFAT's preservative free artificial tears; NSC nuclear sclerotic cataract; PSC posterior subcapsular cataract; ERM epi-retinal membrane; PVD posterior vitreous detachment; RD retinal detachment; DM diabetes mellitus; DR diabetic retinopathy; NPDR non-proliferative diabetic retinopathy; PDR proliferative diabetic retinopathy; CSME clinically significant macular edema; DME diabetic macular edema; dbh  dot blot hemorrhages; CWS cotton wool spot; POAG primary open angle glaucoma; C/D cup-to-disc ratio; HVF humphrey visual field; GVF goldmann visual field; OCT optical coherence tomography; IOP intraocular pressure; BRVO Branch retinal vein occlusion; CRVO central retinal vein occlusion; CRAO central retinal artery occlusion; BRAO branch retinal artery occlusion; RT retinal tear; SB scleral buckle; PPV pars plana vitrectomy; VH Vitreous hemorrhage; PRP panretinal laser photocoagulation; IVK intravitreal kenalog; VMT vitreomacular traction; MH Macular hole;  NVD neovascularization of the disc; NVE neovascularization elsewhere; AREDS age related eye disease study; ARMD age related macular degeneration; POAG primary open angle  glaucoma; EBMD epithelial/anterior basement membrane dystrophy; ACIOL anterior chamber intraocular lens; IOL intraocular lens; PCIOL posterior chamber intraocular lens; Phaco/IOL phacoemulsification with intraocular lens placement; PRK photorefractive keratectomy; LASIK laser assisted in situ keratomileusis; HTN hypertension; DM diabetes mellitus; COPD chronic obstructive pulmonary disease

## 2023-03-31 ENCOUNTER — Other Ambulatory Visit (HOSPITAL_COMMUNITY): Payer: Self-pay

## 2023-03-31 MED ORDER — WEGOVY 1 MG/0.5ML ~~LOC~~ SOAJ
1.0000 mg | SUBCUTANEOUS | 0 refills | Status: DC
  Filled 2023-03-31 – 2023-05-20 (×3): qty 2, 28d supply, fill #0

## 2023-04-01 ENCOUNTER — Ambulatory Visit (INDEPENDENT_AMBULATORY_CARE_PROVIDER_SITE_OTHER): Payer: Medicare HMO | Admitting: Ophthalmology

## 2023-04-01 ENCOUNTER — Encounter (INDEPENDENT_AMBULATORY_CARE_PROVIDER_SITE_OTHER): Payer: Self-pay | Admitting: Ophthalmology

## 2023-04-01 DIAGNOSIS — H35033 Hypertensive retinopathy, bilateral: Secondary | ICD-10-CM

## 2023-04-01 DIAGNOSIS — H3322 Serous retinal detachment, left eye: Secondary | ICD-10-CM

## 2023-04-01 DIAGNOSIS — Z961 Presence of intraocular lens: Secondary | ICD-10-CM | POA: Diagnosis not present

## 2023-04-01 DIAGNOSIS — I10 Essential (primary) hypertension: Secondary | ICD-10-CM

## 2023-04-01 DIAGNOSIS — H35352 Cystoid macular degeneration, left eye: Secondary | ICD-10-CM | POA: Diagnosis not present

## 2023-04-01 DIAGNOSIS — H40052 Ocular hypertension, left eye: Secondary | ICD-10-CM

## 2023-04-01 DIAGNOSIS — H33311 Horseshoe tear of retina without detachment, right eye: Secondary | ICD-10-CM

## 2023-04-01 DIAGNOSIS — H35372 Puckering of macula, left eye: Secondary | ICD-10-CM | POA: Diagnosis not present

## 2023-04-01 MED ORDER — BROMFENAC SODIUM (ONCE-DAILY) 0.09 % OP SOLN: 1.0000 [drp] | Freq: Four times a day (QID) | OPHTHALMIC | 10 refills | Status: AC

## 2023-04-01 MED ORDER — AFLIBERCEPT 2MG/0.05ML IZ SOLN FOR KALEIDOSCOPE
2.0000 mg | INTRAVITREAL | Status: AC | PRN
  Administered 2023-04-01: 2 mg via INTRAVITREAL

## 2023-04-02 ENCOUNTER — Other Ambulatory Visit: Payer: Self-pay

## 2023-04-03 ENCOUNTER — Other Ambulatory Visit (HOSPITAL_COMMUNITY): Payer: Self-pay

## 2023-04-08 NOTE — Telephone Encounter (Signed)
Filled via phone request MS

## 2023-04-15 ENCOUNTER — Other Ambulatory Visit (INDEPENDENT_AMBULATORY_CARE_PROVIDER_SITE_OTHER): Payer: Self-pay | Admitting: Ophthalmology

## 2023-04-20 ENCOUNTER — Other Ambulatory Visit (INDEPENDENT_AMBULATORY_CARE_PROVIDER_SITE_OTHER): Payer: Self-pay

## 2023-04-20 MED ORDER — DORZOLAMIDE HCL-TIMOLOL MAL 2-0.5 % OP SOLN: 1.0000 [drp] | Freq: Two times a day (BID) | OPHTHALMIC | 3 refills | Status: DC

## 2023-04-23 ENCOUNTER — Encounter: Payer: Self-pay | Admitting: Internal Medicine

## 2023-04-23 ENCOUNTER — Other Ambulatory Visit (HOSPITAL_COMMUNITY): Payer: Self-pay

## 2023-04-23 MED ORDER — WEGOVY 1 MG/0.5ML ~~LOC~~ SOAJ
1.0000 mg | SUBCUTANEOUS | 0 refills | Status: AC
  Filled 2023-04-23: qty 2, 28d supply, fill #0

## 2023-04-23 NOTE — Progress Notes (Signed)
[facility] - Clinic Note  04/29/2023    CHIEF COMPLAINT Patient presents for Retina Follow Up   HISTORY OF PRESENT ILLNESS: Alicia Miranda is a 67 y.o. female who presents to the clinic today for:   HPI     Retina Follow Up   Patient presents with  Other.  In left eye.  This started months ago.  Duration of 4 weeks.  I, the attending physician,  performed the HPI with the patient and updated documentation appropriately.        Comments   Patient state the vision is the same. She is using Brimonidine OS BID, Cosopt OS BID, and Prolensa OS at bedtime.       Last edited by Rennis Chris, MD on 04/29/2023 11:29 PM.    Pt states vision is the same   Referring physician: Tally Joe, MD [phone] [address] [place],  [place] [address]  HISTORICAL INFORMATION:   Selected notes from the MEDICAL RECORD NUMBER Referred by Dr. Illene Labrador for possible RD OS LEE: 03/16/02023 Ocular Hx-  PMH- HTN    CURRENT MEDICATIONS: Current Outpatient Medications (Ophthalmic Drugs)  Medication Sig   brimonidine (ALPHAGAN) 0.2 % ophthalmic solution INSTILL 1 DROP INTO LEFT EYE THREE TIMES DAILY   Bromfenac Sodium (PROLENSA) 0.07 % SOLN Place 1 drop into the left eye daily. PT NEEDS 2 BOTTLES DISPENSED AT A TIME-CALL IF COUPON IS NEEDED   Bromfenac Sodium 0.09 % SOLN Place 1 drop into the left eye 4 (four) times daily.   dorzolamide-timolol (COSOPT) 2-0.5 % ophthalmic solution Place 1 drop into the left eye 2 (two) times daily.   LOTEPREDNOL ETABONATE OP Place 1 drop into the right eye 4 (four) times daily.   dorzolamide (TRUSOPT) 2 % ophthalmic solution Place 1 drop into the left eye 3 (three) times daily. (Patient not taking: Reported on 04/29/2023)   erythromycin ophthalmic ointment Place 1 Application into the left eye at bedtime. (Patient not taking: Reported on 04/29/2023)   latanoprost (XALATAN) 0.005 % ophthalmic solution Place 1 drop into the left eye at  bedtime. (Patient not taking: Reported on 04/29/2023)   prednisoLONE acetate (PRED FORTE) 1 % ophthalmic suspension INSTILL 1 DROP INTO LEFT EYE 4 TIMES DAILY (Patient not taking: Reported on 04/29/2023)   prednisoLONE acetate (PRED FORTE) 1 % ophthalmic suspension Place 1 drop into the left eye 4 (four) times daily. (Patient not taking: Reported on 04/29/2023)   No current facility-administered medications for this visit. (Ophthalmic Drugs)   Current Outpatient Medications (Other)  Medication Sig   APPLE CIDER VINEGAR PO Take 2 tablets by mouth daily. gummies   Cholecalciferol (VITAMIN D3) 10 MCG (400 UNIT) tablet Take 400 Units by mouth daily.   CINNAMON PO Take 1,000 mg by mouth every other day.   COLLAGEN PO Take 3 capsules by mouth daily.   Denosumab (PROLIA [place]) Inject 60 mg into the skin every 6 (six) months.   FEROSUL 325 (65 Fe) MG tablet Take 325 mg by mouth daily.   fexofenadine (ALLEGRA) 180 MG tablet Take 180 mg by mouth daily.   fluticasone (FLONASE) 50 MCG/ACT nasal spray Place 1 spray into both nostrils daily.   furosemide (LASIX) 40 MG tablet Take 40 mg by mouth 2 (two) times daily.   Glucosamine HCl (GLUCOSAMINE PO) Take 2 tablets by mouth daily.   losartan (COZAAR) 50 MG tablet Take 100 mg by mouth daily.   Magnesium 400 MG CAPS Take 400 mg  by mouth daily.   Melatonin 5 MG CAPS Take 10 mg by mouth at bedtime.   metformin (FORTAMET) 500 MG (OSM) 24 hr tablet Take 500 mg by mouth daily with breakfast.   metformin (FORTAMET) 500 MG (OSM) 24 hr tablet    pantoprazole (PROTONIX) 40 MG tablet Take 40 mg by mouth 2 (two) times daily.   rosuvastatin (CRESTOR) 20 MG tablet Take 20 mg by mouth daily.   Semaglutide-Weight Management (WEGOVY) 1 MG/0.5ML SOAJ Inject 1 mg into the skin once a week.   Semaglutide-Weight Management (WEGOVY) 1 MG/0.5ML SOAJ Inject 1 mg into the skin once a week.   TURMERIC PO Take 2 tablets by mouth daily.   No current facility-administered medications  for this visit. (Other)   REVIEW OF SYSTEMS: ROS   Positive for: Musculoskeletal, Cardiovascular, Eyes, Respiratory Negative for: Constitutional, Gastrointestinal, Neurological, Skin, Genitourinary, HENT, Endocrine, Psychiatric, Allergic/Imm, Heme/Lymph Last edited by Charlette Caffey, COT on 04/29/2023  1:29 PM.     ALLERGIES Allergies  Allergen Reactions   Codeine Itching   PAST MEDICAL HISTORY Past Medical History:  Diagnosis Date   Allergy    Arthritis    legs   Asthma    GERD (gastroesophageal reflux disease)    Hyperlipidemia    Hypertension    Past Surgical History:  Procedure Laterality Date   ABDOMINAL HYSTERECTOMY     CHOLECYSTECTOMY     COLONOSCOPY     GAS INSERTION Left 09/05/2021   Procedure: INSERTION OF GAS - C3F8;  Surgeon: Rennis Chris, MD;  Location: [facility] OR;  Service: Ophthalmology;  Laterality: Left;   GAS/FLUID EXCHANGE Left 09/05/2021   Procedure: GAS/FLUID EXCHANGE;  Surgeon: Rennis Chris, MD;  Location: [facility] OR;  Service: Ophthalmology;  Laterality: Left;   PARS PLANA VITRECTOMY Left 09/05/2021   Procedure: PARS PLANA VITRECTOMY WITH 25 GAUGE;  Surgeon: Rennis Chris, MD;  Location: [facility] OR;  Service: Ophthalmology;  Laterality: Left;   PERFLUORONE INJECTION Left 09/05/2021   Procedure: PERFLUORON INJECTION;  Surgeon: Rennis Chris, MD;  Location: [facility] OR;  Service: Ophthalmology;  Laterality: Left;   PHOTOCOAGULATION WITH LASER Left 09/05/2021   Procedure: PHOTOCOAGULATION WITH LASER;  Surgeon: Rennis Chris, MD;  Location[facility] OR;  Service: Ophthalmology;  Laterality: Left;   SCLERAL BUCKLE Left 09/05/2021   Procedure: SCLERAL BUCKLE;  Surgeon: Rennis Chris, MD;  Location: [facility] OR;  Service: Ophthalmology;  Laterality: Left;   UPPER GASTROINTESTINAL ENDOSCOPY     FAMILY HISTORY Family History  Problem Relation Age of Onset   Retinal detachment Sister    Diabetes Sister    Glaucoma Brother    Diabetes Brother    Colon cancer Brother        dx at 48    Esophageal cancer Neg Hx    Rectal cancer Neg Hx    Stomach cancer Neg Hx    SOCIAL HISTORY Social History   Tobacco Use   Smoking status: Former   Smokeless tobacco: Never   Tobacco comments:    quit smoking in 2002 or 2003  Vaping Use   Vaping status: Never Used  Substance Use Topics   Alcohol use: Yes    Comment: very rarely   Drug use: No       OPHTHALMIC EXAM:  Base Eye Exam     Visual Acuity (Snellen - Linear)       Right Left   Dist [place] 20/20 20/250         Tonometry (Tonopen, 1:34 PM)  Right Left   Pressure 14 15         Pupils       Dark Light Shape React APD   Right 3 2 Round Brisk None   Left 3 2 Round Brisk None         Visual Fields       Left Right    Full Full         Extraocular Movement       Right Left    Full, Ortho Full, Ortho         Neuro/Psych     Oriented x3: Yes   Mood/Affect: Normal         Dilation     Both eyes: 1.0% Mydriacyl, 2.5% Phenylephrine @ 1:31 PM           Slit Lamp and Fundus Exam     External Exam       Right Left   External  Periorbital edema -- improved         Slit Lamp Exam       Right Left   Lids/Lashes Dermatochalasis - upper lid Dermatochalasis - upper lid, lid erythema improved   Conjunctiva/Sclera white and quiet White and quiet, STK ST quad   Cornea trace PEE, mild tear film debris, well healed cataract wound trace PEE, mild tear film debris, well healed cataract wound, inferior paracentral corneal haze   Anterior Chamber deep and clear deep and clear   Iris round and dilated round and dilated   Lens PC IOL in good position PC IOL in good position   Anterior Vitreous syneresis, asteroid hyalosis, PVD post vitrectomy         Fundus Exam       Right Left   Disc Pink and Sharp mild Pallor, Sharp rim, Compact   C/D Ratio 0.4 0.3   Macula flat, good foveal reflex, mild RPE mottling, No heme or edema Flat, Blunted foveal reflex, RPE mottling, ERM with striae  greatest nasal mac, diffuse edema/cystic changes centrally and nasally - persistent, No heme   Vessels attenuated, Tortuous attenuated, Tortuous   Periphery Attached, operculated hole @ 1030 with mild surrounding pigment -- good laser surrounding, no SRF, no new RT/RD Attached, Trace pockets of SRF superior to disc with fibrosis, focal fibrosis at 0600 posterior to buckle, Retina attached over buckle, good buckle height, good laser over buckle and around tears, PRE-OP: small tear with fibrosis @ 0200, bullous, temporal RD from 1230 to 0430           Refraction     Wearing Rx       Sphere Cylinder Axis Add   Right +1.00 +1.00 130 +2.75   Left +1.25 +0.75 017 +2.75           IMAGING AND PROCEDURES  Imaging and Procedures for 04/29/2023  OCT, Retina - OU - Both Eyes       Right Eye Quality was good. Central Foveal Thickness: 273. Progression has been stable. Findings include normal foveal contour, no IRF, no SRF.   Left Eye Quality was good. Central Foveal Thickness: 748. Progression has worsened. Findings include abnormal foveal contour, epiretinal membrane, intraretinal fluid, macular pucker, subretinal fluid (ERM with blunted foveal contour and mild pucker, persistent cystic changes/diffuse edema and central SRF - slightly increased ).   Notes *Images captured and stored on drive  Diagnosis / Impression:  OD: NFP; no IRF/SRF OS: ERM with blunted foveal contour and mild pucker; persistent cystic  changes/diffuse edema, central SRF -- slightly increased  Clinical management:  See below  Abbreviations: NFP - Normal foveal profile. CME - cystoid macular edema. PED - pigment epithelial detachment. IRF - intraretinal fluid. SRF - subretinal fluid. EZ - ellipsoid zone. ERM - epiretinal membrane. ORA - outer retinal atrophy. ORT - outer retinal tubulation. SRHM - subretinal hyper-reflective material. IRHM - intraretinal hyper-reflective material      Intravitreal Injection,  Pharmacologic Agent - OS - Left Eye       Time Out 04/29/2023. 1:57 PM. Confirmed correct patient, procedure, site, and patient consented.   Anesthesia Topical anesthesia was used. Anesthetic medications included Lidocaine 2%, Proparacaine 0.5%.   Procedure Preparation included 5% betadine to ocular surface, eyelid speculum. A (32g) needle was used.   Injection: 4 mg triamcinolone acetonide 40 MG/ML   Route: Intravitreal, Site: Left Eye   NDC: 8657-8469-62, Lot: [id number], Expiration date: 02/14/2024   Post-op Post injection exam found visual acuity of at least counting fingers. The patient tolerated the procedure well. There were no complications. The patient received written and verbal post procedure care education.   Notes An AC tap was performed following injection due to elevated IOP using a 30 gauge needle on a syringe with the plunger removed. The needle was placed at the limbus at 5 oclock and approximately 0.08cc of aqueous was removed from the anterior chamber. Betadine was applied to the tap area before and after the paracentesis was performed. There were no complications. The patient tolerated the procedure well. The IOP was rechecked and was found to be ~9 mmHg by digital palpation.            ASSESSMENT/PLAN:    ICD-10-CM   1. Left retinal detachment  H33.22 OCT, Retina - OU - Both Eyes    2. Cystoid macular edema of left eye  H35.352 Intravitreal Injection, Pharmacologic Agent - OS - Left Eye    triamcinolone acetonide (KENALOG-40) injection 4 mg    3. Epiretinal membrane (ERM) of left eye  H35.372     4. Retinal tear of right eye  H33.311     5. Essential hypertension  I10     6. Hypertensive retinopathy of both eyes  H35.033     7. 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 Rhegmatogenous retinal detachment with retinal hole, left eye - bullous, temporal, mac off detachment, onset of foveal involvement  Tuesday, 03.14.23 by history - detached from 1230 to 430 oclock, fovea off, small tear at 0200 - s/p SBP + PPV/PFO/EL/FAX/14% C3F8 OS, 03.23.2023 - retina attached and in good position -- good buckle height and laser around breaks  - BCVA OS 20/200 - stable - OCT shows OS: ERM with blunted foveal contour and mild pucker, persistent cystic changes/diffuse edema, central SRF -- slightly worse - FA 12.18.23 shows OD Focal staining ST periphery corresponding to laser retinopexy, OS Mild Hyperfluorescence of disc, mild perifoveal staining SN to fovea--?petaloid -- suggestive of CME - s/p STK #1 (11.20.23) for CME, #2 (02.28.24), #3 (07.03.24) - s/p IVA OS #1 (05.01.24), #2 (05.29.24), #3 (06.26.24) -- IVA resistance  - s/p IVE OS #1 (07.24.24), #2 (08.21.24), #3 (09.18.24), #4 (10.16.24) - IOP OS 15 today -- h/o steroid response - continue Prolensa Qdaily OS for CME (decreased from QID due to 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 Epiretinal membrane, left eye  - ERM with persistent cystic edema - OCT shows persistent ERM w/ cystic changes slightly increased - monitor for now, but discussed possibility of repeat PPV w/ membrane peel to remove ERM if vision fails to improve  4. Retinal hole OD  - operculated retinal hole located at 1030 with mild surrounding pigment, no SRF - s/p laser retinopexy OD (03.16.23) -- good laser surrounding - continue to monitor  5,6. Hypertensive retinopathy OU - discussed  importance of tight BP control - continue to monitor  7. Pseudophakia OU  - s/p CE/IOL OS (Dr. Zenaida Niece, 10.04.23), CE with IOL OD (11.01.23, Dr. Zenaida Niece)  - IOL in good position, doing well  - continue to monitor  8. Ocular hypertension OS  - likely steroid response  - tmax 33 on 01.29.24  - s/p SLT OS w/ Dr. Wynelle Link on 12.05.23  - IOP today: 14  - continue Brimonidine BID OS, Cosopt BID OS - discontinue Latanoprost due to possible contribution to CME  Ophthalmic Meds Ordered this visit:  Meds ordered this encounter  Medications   triamcinolone acetonide (KENALOG-40) injection 4 mg     Return in about 2 weeks (around 05/13/2023) for f/u CME OS, DFE, OCT.  There are no Patient Instructions on file for this visit.  Explained the diagnoses, plan, and follow up with the patient and they expressed understanding.  Patient expressed understanding of the importance of proper follow up care.   This document serves as a record of services personally performed by Karie Chimera, MD, PhD. It was created on their behalf by De Blanch, an ophthalmic technician. The creation of this record is the provider's dictation and/or activities during the visit.    Electronically signed by: De Blanch, OA, 04/29/23  11:31 PM  This document serves as a record of services personally performed by Karie Chimera, MD, PhD. It was created on their behalf by Glee Arvin. Manson Passey, OA an ophthalmic technician. The creation of this record is the provider's dictation and/or activities during the visit.    Electronically signed by: Glee Arvin. Manson Passey, OA 04/29/23 11:31 PM  Karie Chimera, M.D., Ph.D. Diseases & Surgery of the Retina and Vitreous [facility]  I have reviewed the above documentation for accuracy and completeness, and I agree with the above. Karie Chimera, M.D., Ph.D. 04/29/23 11:33 PM  Abbreviations: M myopia (nearsighted); A astigmatism; H hyperopia (farsighted); P  presbyopia; Mrx spectacle prescription;  CTL contact lenses; OD right eye; OS left eye; OU both eyes  XT exotropia; ET esotropia; PEK punctate epithelial keratitis; PEE punctate epithelial erosions; DES dry eye syndrome; MGD meibomian gland dysfunction; ATs artificial tears; PFAT's preservative free artificial tears; NSC nuclear sclerotic cataract; PSC posterior subcapsular cataract; ERM epi-retinal membrane; PVD posterior vitreous detachment; RD retinal detachment; DM diabetes mellitus; DR diabetic retinopathy; NPDR non-proliferative diabetic retinopathy; PDR proliferative diabetic retinopathy; CSME clinically significant macular edema; DME diabetic macular edema; dbh dot blot hemorrhages; CWS cotton wool spot; POAG primary open angle glaucoma; C/D cup-to-disc ratio; HVF humphrey visual  field; GVF goldmann visual field; OCT optical coherence tomography; IOP intraocular pressure; BRVO Branch retinal vein occlusion; CRVO central retinal vein occlusion; CRAO central retinal artery occlusion; BRAO branch retinal artery occlusion; RT retinal tear; SB scleral buckle; PPV pars plana vitrectomy; VH Vitreous hemorrhage; PRP panretinal laser photocoagulation; IVK intravitreal kenalog; VMT vitreomacular traction; MH Macular hole;  NVD neovascularization of the disc; NVE neovascularization elsewhere; AREDS age related eye disease study; ARMD age related macular degeneration; POAG primary open angle glaucoma; EBMD epithelial/anterior basement membrane dystrophy; ACIOL anterior chamber intraocular lens; IOL intraocular lens; PCIOL posterior chamber intraocular lens; Phaco/IOL phacoemulsification with intraocular lens placement; PRK photorefractive keratectomy; LASIK laser assisted in situ keratomileusis; HTN hypertension; DM diabetes mellitus; COPD chronic obstructive pulmonary disease

## 2023-04-24 ENCOUNTER — Other Ambulatory Visit (HOSPITAL_COMMUNITY): Payer: Self-pay

## 2023-04-29 ENCOUNTER — Ambulatory Visit (INDEPENDENT_AMBULATORY_CARE_PROVIDER_SITE_OTHER): Payer: Medicare HMO | Admitting: Ophthalmology

## 2023-04-29 ENCOUNTER — Encounter (INDEPENDENT_AMBULATORY_CARE_PROVIDER_SITE_OTHER): Payer: Self-pay | Admitting: Ophthalmology

## 2023-04-29 DIAGNOSIS — H35352 Cystoid macular degeneration, left eye: Secondary | ICD-10-CM | POA: Diagnosis not present

## 2023-04-29 DIAGNOSIS — H35372 Puckering of macula, left eye: Secondary | ICD-10-CM

## 2023-04-29 DIAGNOSIS — H35033 Hypertensive retinopathy, bilateral: Secondary | ICD-10-CM

## 2023-04-29 DIAGNOSIS — H33311 Horseshoe tear of retina without detachment, right eye: Secondary | ICD-10-CM | POA: Diagnosis not present

## 2023-04-29 DIAGNOSIS — H3322 Serous retinal detachment, left eye: Secondary | ICD-10-CM

## 2023-04-29 DIAGNOSIS — H40052 Ocular hypertension, left eye: Secondary | ICD-10-CM

## 2023-04-29 DIAGNOSIS — Z961 Presence of intraocular lens: Secondary | ICD-10-CM | POA: Diagnosis not present

## 2023-04-29 DIAGNOSIS — I10 Essential (primary) hypertension: Secondary | ICD-10-CM

## 2023-04-29 MED ORDER — TRIAMCINOLONE ACETONIDE 40 MG/ML IJ SUSP FOR KALEIDOSCOPE
4.0000 mg | INTRAMUSCULAR | Status: AC | PRN
  Administered 2023-04-29: 4 mg via INTRAVITREAL

## 2023-05-07 NOTE — Progress Notes (Signed)
[facility] - Clinic Note  05/13/2023    CHIEF COMPLAINT Patient presents for Retina Follow Up   HISTORY OF PRESENT ILLNESS: Alicia Miranda is a 67 y.o. female who presents to the clinic today for:   HPI     Retina Follow Up   Patient presents with  Other.  In left eye.  This started months ago.  Duration of 2 weeks.  I, the attending physician,  performed the HPI with the patient and updated documentation appropriately.        Comments   Patient states the vision is about the same maybe a little better. After the last injection the left eye felt irritated and she was using a cold compress. She is using Prolensa Qdaily OS, Brimonidine BID OS, and Cosopt BID OS.       Last edited by Rennis Chris, MD on 05/13/2023 12:31 PM.      Referring physician: Tally Joe, MD [phone] [address] [place],  [place] [address]  HISTORICAL INFORMATION:   Selected notes from the MEDICAL RECORD NUMBER Referred by Dr. Illene Labrador for possible RD OS LEE: 03/16/02023 Ocular Hx-  PMH- HTN    CURRENT MEDICATIONS: Current Outpatient Medications (Ophthalmic Drugs)  Medication Sig   brimonidine (ALPHAGAN) 0.2 % ophthalmic solution INSTILL 1 DROP INTO LEFT EYE THREE TIMES DAILY   Bromfenac Sodium (PROLENSA) 0.07 % SOLN Place 1 drop into the left eye daily. PT NEEDS 2 BOTTLES DISPENSED AT A TIME-CALL IF COUPON IS NEEDED   Bromfenac Sodium 0.09 % SOLN Place 1 drop into the left eye 4 (four) times daily.   dorzolamide-timolol (COSOPT) 2-0.5 % ophthalmic solution Place 1 drop into the left eye 2 (two) times daily.   latanoprost (XALATAN) 0.005 % ophthalmic solution Place 1 drop into the left eye at bedtime.   loteprednol (LOTEMAX) 0.5 % ophthalmic suspension Place 1 drop into the left eye 2 (two) times daily.   LOTEPREDNOL ETABONATE OP Place 1 drop into the right eye 4 (four) times daily.   dorzolamide (TRUSOPT) 2 % ophthalmic solution Place 1 drop into the left eye 3  (three) times daily. (Patient not taking: Reported on 05/13/2023)   erythromycin ophthalmic ointment Place 1 Application into the left eye at bedtime. (Patient not taking: Reported on 05/13/2023)   prednisoLONE acetate (PRED FORTE) 1 % ophthalmic suspension INSTILL 1 DROP INTO LEFT EYE 4 TIMES DAILY (Patient not taking: Reported on 05/13/2023)   prednisoLONE acetate (PRED FORTE) 1 % ophthalmic suspension Place 1 drop into the left eye 4 (four) times daily. (Patient not taking: Reported on 05/13/2023)   No current facility-administered medications for this visit. (Ophthalmic Drugs)   Current Outpatient Medications (Other)  Medication Sig   APPLE CIDER VINEGAR PO Take 2 tablets by mouth daily. gummies   Cholecalciferol (VITAMIN D3) 10 MCG (400 UNIT) tablet Take 400 Units by mouth daily.   CINNAMON PO Take 1,000 mg by mouth every other day.   COLLAGEN PO Take 3 capsules by mouth daily.   Denosumab (PROLIA [place]) Inject 60 mg into the skin every 6 (six) months.   FEROSUL 325 (65 Fe) MG tablet Take 325 mg by mouth daily.   fexofenadine (ALLEGRA) 180 MG tablet Take 180 mg by mouth daily.   fluticasone (FLONASE) 50 MCG/ACT nasal spray Place 1 spray into both nostrils daily.   furosemide (LASIX) 40 MG tablet Take 40 mg by mouth 2 (two) times daily.   Glucosamine HCl (GLUCOSAMINE PO) Take  2 tablets by mouth daily.   losartan (COZAAR) 50 MG tablet Take 100 mg by mouth daily.   Magnesium 400 MG CAPS Take 400 mg by mouth daily.   Melatonin 5 MG CAPS Take 10 mg by mouth at bedtime.   metformin (FORTAMET) 500 MG (OSM) 24 hr tablet Take 500 mg by mouth daily with breakfast.   metformin (FORTAMET) 500 MG (OSM) 24 hr tablet    pantoprazole (PROTONIX) 40 MG tablet Take 40 mg by mouth 2 (two) times daily.   rosuvastatin (CRESTOR) 20 MG tablet Take 20 mg by mouth daily.   Semaglutide-Weight Management (WEGOVY) 1 MG/0.5ML SOAJ Inject 1 mg into the skin once a week.   Semaglutide-Weight Management (WEGOVY) 1  MG/0.5ML SOAJ Inject 1 mg into the skin once a week.   TURMERIC PO Take 2 tablets by mouth daily.   No current facility-administered medications for this visit. (Other)   REVIEW OF SYSTEMS: ROS   Positive for: Musculoskeletal, Cardiovascular, Eyes, Respiratory Negative for: Constitutional, Gastrointestinal, Neurological, Skin, Genitourinary, HENT, Endocrine, Psychiatric, Allergic/Imm, Heme/Lymph Last edited by Charlette Caffey, COT on 05/13/2023  9:10 AM.     ALLERGIES Allergies  Allergen Reactions   Codeine Itching   PAST MEDICAL HISTORY Past Medical History:  Diagnosis Date   Allergy    Arthritis    legs   Asthma    GERD (gastroesophageal reflux disease)    Hyperlipidemia    Hypertension    Past Surgical History:  Procedure Laterality Date   ABDOMINAL HYSTERECTOMY     CHOLECYSTECTOMY     COLONOSCOPY     GAS INSERTION Left 09/05/2021   Procedure: INSERTION OF GAS - C3F8;  Surgeon: Rennis Chris, MD;  Location: [facility] OR;  Service: Ophthalmology;  Laterality: Left;   GAS/FLUID EXCHANGE Left 09/05/2021   Procedure: GAS/FLUID EXCHANGE;  Surgeon: Rennis Chris, MD;  Location: [facility] OR;  Service: Ophthalmology;  Laterality: Left;   PARS PLANA VITRECTOMY Left 09/05/2021   Procedure: PARS PLANA VITRECTOMY WITH 25 GAUGE;  Surgeon: Rennis Chris, MD;  Location: [facility] OR;  Service: Ophthalmology;  Laterality: Left;   PERFLUORONE INJECTION Left 09/05/2021   Procedure: PERFLUORON INJECTION;  Surgeon: Rennis Chris, MD;  Location: [facility] OR;  Service: Ophthalmology;  Laterality: Left;   PHOTOCOAGULATION WITH LASER Left 09/05/2021   Procedure: PHOTOCOAGULATION WITH LASER;  Surgeon: Rennis Chris, MD;  Location: [facility] OR;  Service: Ophthalmology;  Laterality: Left;   SCLERAL BUCKLE Left 09/05/2021   Procedure: SCLERAL BUCKLE;  Surgeon: Rennis Chris, MD;  Location: [facility] OR;  Service: Ophthalmology;  Laterality: Left;   UPPER GASTROINTESTINAL ENDOSCOPY     FAMILY HISTORY Family History  Problem Relation  Age of Onset   Retinal detachment Sister    Diabetes Sister    Glaucoma Brother    Diabetes Brother    Colon cancer Brother        dx at 83   Esophageal cancer Neg Hx    Rectal cancer Neg Hx    Stomach cancer Neg Hx    SOCIAL HISTORY Social History   Tobacco Use   Smoking status: Former   Smokeless tobacco: Never   Tobacco comments:    quit smoking in 2002 or 2003  Vaping Use   Vaping status: Never Used  Substance Use Topics   Alcohol use: Yes    Comment: very rarely   Drug use: No       OPHTHALMIC EXAM:  Base Eye Exam     Visual Acuity (Snellen - Linear)  Right Left   Dist [place] 20/20 20/200   Dist ph [place]  NI         Tonometry (Tonopen, 9:14 AM)       Right Left   Pressure 16 17         Pupils       Dark Light Shape React APD   Right 3 2 Round Brisk None   Left 3 2 Round Brisk None         Visual Fields       Left Right    Full Full         Extraocular Movement       Right Left    Full, Ortho Full, Ortho         Neuro/Psych     Oriented x3: Yes   Mood/Affect: Normal         Dilation     Both eyes: 1.0% Mydriacyl, 2.5% Phenylephrine @ 9:11 AM           Slit Lamp and Fundus Exam     External Exam       Right Left   External  Periorbital edema         Slit Lamp Exam       Right Left   Lids/Lashes Dermatochalasis - upper lid Dermatochalasis - upper lid, periorbital erythema   Conjunctiva/Sclera white and quiet White and quiet, STK ST quad   Cornea trace PEE, mild tear film debris, well healed cataract wound trace PEE, mild tear film debris, well healed cataract wound, inferior paracentral corneal haze   Anterior Chamber deep and clear deep and clear   Iris round and dilated round and dilated   Lens PC IOL in good position PC IOL in good position   Anterior Vitreous syneresis, asteroid hyalosis, PVD post vitrectomy, trace residual kenalog settled inferiorly         Fundus Exam       Right Left   Disc  Pink and Sharp mild Pallor, Sharp rim, Compact   C/D Ratio 0.4 0.3   Macula flat, good foveal reflex, mild RPE mottling, No heme or edema Flat, Blunted foveal reflex, RPE mottling, ERM with striae greatest nasal mac, diffuse edema/cystic changes centrally and nasally -- slightly improved, No heme   Vessels attenuated, mild tortuosity attenuated, Tortuous   Periphery Attached, operculated hole @ 1030 with mild surrounding pigment -- good laser surrounding, no SRF, no new RT/RD Attached, Trace pockets of SRF superior to disc with fibrosis, focal fibrosis at 0600 posterior to buckle, Retina attached over buckle, good buckle height, good laser over buckle and around tears, PRE-OP: small tear with fibrosis @ 0200, bullous, temporal RD from 1230 to 0430           Refraction     Wearing Rx       Sphere Cylinder Axis Add   Right +1.00 +1.00 130 +2.75   Left +1.25 +0.75 017 +2.75           IMAGING AND PROCEDURES  Imaging and Procedures for 05/13/2023  OCT, Retina - OU - Both Eyes       Right Eye Quality was good. Central Foveal Thickness: 277. Progression has been stable. Findings include normal foveal contour, no IRF, no SRF.   Left Eye Quality was good. Central Foveal Thickness: 612. Progression has improved. Findings include abnormal foveal contour, epiretinal membrane, intraretinal fluid, macular pucker, subretinal fluid (ERM with blunted foveal contour and mild pucker, interval improvement cystic  changes/diffuse edema and central SRF ).   Notes *Images captured and stored on drive  Diagnosis / Impression:  OD: NFP; no IRF/SRF OS: ERM with blunted foveal contour and mild pucker, interval improvement cystic changes/diffuse edema and central SRF   Clinical management:  See below  Abbreviations: NFP - Normal foveal profile. CME - cystoid macular edema. PED - pigment epithelial detachment. IRF - intraretinal fluid. SRF - subretinal fluid. EZ - ellipsoid zone. ERM - epiretinal  membrane. ORA - outer retinal atrophy. ORT - outer retinal tubulation. SRHM - subretinal hyper-reflective material. IRHM - intraretinal hyper-reflective material            ASSESSMENT/PLAN:    ICD-10-CM   1. Left retinal detachment  H33.22 OCT, Retina - OU - Both Eyes    2. Cystoid macular edema of left eye  H35.352     3. Epiretinal membrane (ERM) of left eye  H35.372     4. Retinal tear of right eye  H33.311     5. Essential hypertension  I10     6. Hypertensive retinopathy of both eyes  H35.033     7. 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 Rhegmatogenous retinal detachment with retinal hole, left eye - bullous, temporal, mac off detachment, onset of foveal involvement Tuesday, 03.14.23 by history - detached from 1230 to 430 oclock, fovea off, small tear at 0200 - s/p SBP + PPV/PFO/EL/FAX/14% C3F8 OS, 03.23.2023 - retina attached and in good position -- good buckle height and laser around breaks  - FA 12.18.23 shows OD Focal staining ST periphery corresponding to laser retinopexy, OS Mild Hyperfluorescence of disc, mild perifoveal staining SN to fovea--?petaloid -- suggestive of CME - BCVA OS 20/200 from 20/250 - OCT shows OS: ERM with blunted foveal contour and mild pucker, interval improvement in cystic changes/diffuse edema, central SRF  - s/p STK #1 (11.20.23) for CME, #2 (02.28.24), #3 (07.03.24) - s/p IVA OS #1 (05.01.24), #2 (05.29.24), #3 (06.26.24) -- IVA resistance  - s/p IVE OS #1 (07.24.24), #2 (08.21.24), #3 (09.18.24), #4 (10.16.24)  - s/p IVK OS #1 (11.13.24) - IOP OS 17 today -- h/o steroid response - continue Prolensa Qdaily OS for CME (decreased from QID due to 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) -- will hold for now  - continue holding PF (h/o steroid response) -- will try to Lotemax BID OS  - continue Brimonidine BID OS, Cosopt BID OS - discontinue Latanoprost due to possible contribution to CME - IVK  informed consent obtained and signed, 11.13.24 - IVE informed consent obtained and signed, 07.24.24 (OS) - see procedure note - Eylea authorized for 2024 - f/u 2 weeks - DFE, OCT, possible injection (IVK)  3. Epiretinal membrane, left eye  - ERM with persistent cystic edema - OCT shows persistent ERM w/ cystic changes slightly increased - monitor for now, but discussed possibility of repeat PPV w/ membrane peel to remove ERM if vision fails to improve  4. Retinal hole OD  - operculated retinal hole located at 1030 with mild surrounding pigment, no SRF - s/p laser retinopexy OD (03.16.23) -- good laser surrounding - continue to monitor  5,6. Hypertensive retinopathy OU - discussed importance of tight BP control - continue to monitor  7. Pseudophakia OU  - s/p CE/IOL OS (Dr. Zenaida Niece, 10.04.23), CE with IOL OD (11.01.23, Dr. Zenaida Niece)  - IOL in good position, doing well  - continue  to monitor  8. Ocular hypertension OS  - likely steroid response  - tmax 33 on 01.29.24  - s/p SLT OS w/ Dr. Wynelle Link on 12.05.23  - IOP today: 17  - continue Brimonidine BID OS, Cosopt BID OS - discontinue Latanoprost due to possible contribution to CME  Ophthalmic Meds Ordered this visit:  Meds ordered this encounter  Medications   loteprednol (LOTEMAX) 0.5 % ophthalmic suspension    Sig: Place 1 drop into the left eye 2 (two) times daily.    Dispense:  15 mL    Refill:  1     Return in about 2 weeks (around 05/27/2023) for f/u RD OS, DFE, OCT.  There are no Patient Instructions on file for this visit.  Explained the diagnoses, plan, and follow up with the patient and they expressed understanding.  Patient expressed understanding of the importance of proper follow up care.   This document serves as a record of services personally performed by Karie Chimera, MD, PhD. It was created on their behalf by De Blanch, an ophthalmic technician. The creation of this record is the provider's dictation and/or  activities during the visit.    Electronically signed by: De Blanch, OA, 05/13/23  12:31 PM  This document serves as a record of services personally performed by Karie Chimera, MD, PhD. It was created on their behalf by Glee Arvin. Manson Passey, OA an ophthalmic technician. The creation of this record is the provider's dictation and/or activities during the visit.    Electronically signed by: Glee Arvin. Manson Passey, OA 05/13/23 12:31 PM  Karie Chimera, M.D., Ph.D. Diseases & Surgery of the Retina and Vitreous [facility]  I have reviewed the above documentation for accuracy and completeness, and I agree with the above. Karie Chimera, M.D., Ph.D. 05/13/23 12:33 PM  Abbreviations: M myopia (nearsighted); A astigmatism; H hyperopia (farsighted); P presbyopia; Mrx spectacle prescription;  CTL contact lenses; OD right eye; OS left eye; OU both eyes  XT exotropia; ET esotropia; PEK punctate epithelial keratitis; PEE punctate epithelial erosions; DES dry eye syndrome; MGD meibomian gland dysfunction; ATs artificial tears; PFAT's preservative free artificial tears; NSC nuclear sclerotic cataract; PSC posterior subcapsular cataract; ERM epi-retinal membrane; PVD posterior vitreous detachment; RD retinal detachment; DM diabetes mellitus; DR diabetic retinopathy; NPDR non-proliferative diabetic retinopathy; PDR proliferative diabetic retinopathy; CSME clinically significant macular edema; DME diabetic macular edema; dbh dot blot hemorrhages; CWS cotton wool spot; POAG primary open angle glaucoma; C/D cup-to-disc ratio; HVF humphrey visual field; GVF goldmann visual field; OCT optical coherence tomography; IOP intraocular pressure; BRVO Branch retinal vein occlusion; CRVO central retinal vein occlusion; CRAO central retinal artery occlusion; BRAO branch retinal artery occlusion; RT retinal tear; SB scleral buckle; PPV pars plana vitrectomy; VH Vitreous hemorrhage; PRP panretinal laser  photocoagulation; IVK intravitreal kenalog; VMT vitreomacular traction; MH Macular hole;  NVD neovascularization of the disc; NVE neovascularization elsewhere; AREDS age related eye disease study; ARMD age related macular degeneration; POAG primary open angle glaucoma; EBMD epithelial/anterior basement membrane dystrophy; ACIOL anterior chamber intraocular lens; IOL intraocular lens; PCIOL posterior chamber intraocular lens; Phaco/IOL phacoemulsification with intraocular lens placement; PRK photorefractive keratectomy; LASIK laser assisted in situ keratomileusis; HTN hypertension; DM diabetes mellitus; COPD chronic obstructive pulmonary disease

## 2023-05-11 DIAGNOSIS — L299 Pruritus, unspecified: Secondary | ICD-10-CM | POA: Diagnosis not present

## 2023-05-11 DIAGNOSIS — Z6841 Body Mass Index (BMI) 40.0 and over, adult: Secondary | ICD-10-CM | POA: Diagnosis not present

## 2023-05-11 DIAGNOSIS — R030 Elevated blood-pressure reading, without diagnosis of hypertension: Secondary | ICD-10-CM | POA: Diagnosis not present

## 2023-05-13 ENCOUNTER — Ambulatory Visit (INDEPENDENT_AMBULATORY_CARE_PROVIDER_SITE_OTHER): Payer: Medicare HMO | Admitting: Ophthalmology

## 2023-05-13 ENCOUNTER — Encounter (INDEPENDENT_AMBULATORY_CARE_PROVIDER_SITE_OTHER): Payer: Self-pay | Admitting: Ophthalmology

## 2023-05-13 DIAGNOSIS — H35033 Hypertensive retinopathy, bilateral: Secondary | ICD-10-CM | POA: Diagnosis not present

## 2023-05-13 DIAGNOSIS — H33311 Horseshoe tear of retina without detachment, right eye: Secondary | ICD-10-CM

## 2023-05-13 DIAGNOSIS — I10 Essential (primary) hypertension: Secondary | ICD-10-CM

## 2023-05-13 DIAGNOSIS — H3322 Serous retinal detachment, left eye: Secondary | ICD-10-CM | POA: Diagnosis not present

## 2023-05-13 DIAGNOSIS — H40052 Ocular hypertension, left eye: Secondary | ICD-10-CM | POA: Diagnosis not present

## 2023-05-13 DIAGNOSIS — H35352 Cystoid macular degeneration, left eye: Secondary | ICD-10-CM | POA: Diagnosis not present

## 2023-05-13 DIAGNOSIS — Z961 Presence of intraocular lens: Secondary | ICD-10-CM

## 2023-05-13 DIAGNOSIS — H35372 Puckering of macula, left eye: Secondary | ICD-10-CM

## 2023-05-13 MED ORDER — LOTEPREDNOL ETABONATE 0.5 % OP SUSP: 1.0000 [drp] | Freq: Two times a day (BID) | OPHTHALMIC | 1 refills | Status: DC

## 2023-05-20 ENCOUNTER — Other Ambulatory Visit (HOSPITAL_COMMUNITY): Payer: Self-pay

## 2023-05-21 NOTE — Progress Notes (Signed)
[facility] - Clinic Note  05/27/2023    CHIEF COMPLAINT Patient presents for Retina Follow Up   HISTORY OF PRESENT ILLNESS: Alicia Miranda is a 67 y.o. female who presents to the clinic today for:   HPI     Retina Follow Up   Patient presents with  Retinal Break/Detachment.  In left eye.  This started 2 weeks ago.  Duration of 2 weeks.  Since onset it is stable.  I, the attending physician,  performed the HPI with the patient and updated documentation appropriately.        Comments   2 week retina follow up CME/RD OS and IVK OS pt is reporting no vision changes noticed she denies any flashes or floaters she is using Lotemax BID OS Brimonidine BID OS, Cosopt BID OS pt is not currently check blood sugar at this time        Last edited by Rennis Chris, MD on 05/27/2023  4:19 PM.     Referring physician: Tally Joe, MD [id number] [address] [place],  [place] [address]  HISTORICAL INFORMATION:   Selected notes from the MEDICAL RECORD NUMBER Referred by Dr. Illene Labrador for possible RD OS LEE: 03/16/02023 Ocular Hx-  PMH- HTN    CURRENT MEDICATIONS: Current Outpatient Medications (Ophthalmic Drugs)  Medication Sig   brimonidine (ALPHAGAN) 0.2 % ophthalmic solution INSTILL 1 DROP INTO LEFT EYE THREE TIMES DAILY   Bromfenac Sodium (PROLENSA) 0.07 % SOLN Place 1 drop into the left eye daily. PT NEEDS 2 BOTTLES DISPENSED AT A TIME-CALL IF COUPON IS NEEDED   Bromfenac Sodium 0.09 % SOLN Place 1 drop into the left eye 4 (four) times daily.   dorzolamide (TRUSOPT) 2 % ophthalmic solution Place 1 drop into the left eye 3 (three) times daily. (Patient not taking: Reported on 05/13/2023)   dorzolamide-timolol (COSOPT) 2-0.5 % ophthalmic solution Place 1 drop into the left eye 2 (two) times daily.   erythromycin ophthalmic ointment Place 1 Application into the left eye at bedtime. (Patient not taking: Reported on 05/13/2023)   latanoprost (XALATAN) 0.005 %  ophthalmic solution Place 1 drop into the left eye at bedtime.   loteprednol (LOTEMAX) 0.5 % ophthalmic suspension Place 1 drop into the left eye 2 (two) times daily.   LOTEPREDNOL ETABONATE OP Place 1 drop into the right eye 4 (four) times daily.   prednisoLONE acetate (PRED FORTE) 1 % ophthalmic suspension INSTILL 1 DROP INTO LEFT EYE 4 TIMES DAILY (Patient not taking: Reported on 05/13/2023)   prednisoLONE acetate (PRED FORTE) 1 % ophthalmic suspension Place 1 drop into the left eye 4 (four) times daily. (Patient not taking: Reported on 05/13/2023)   No current facility-administered medications for this visit. (Ophthalmic Drugs)   Current Outpatient Medications (Other)  Medication Sig   APPLE CIDER VINEGAR PO Take 2 tablets by mouth daily. gummies   Cholecalciferol (VITAMIN D3) 10 MCG (400 UNIT) tablet Take 400 Units by mouth daily.   CINNAMON PO Take 1,000 mg by mouth every other day.   COLLAGEN PO Take 3 capsules by mouth daily.   Denosumab (PROLIA Colp) Inject 60 mg into the skin every 6 (six) months.   FEROSUL 325 (65 Fe) MG tablet Take 325 mg by mouth daily.   fexofenadine (ALLEGRA) 180 MG tablet Take 180 mg by mouth daily.   fluticasone (FLONASE) 50 MCG/ACT nasal spray Place 1 spray into both nostrils daily.   furosemide (LASIX) 40 MG tablet Take 40 mg  by mouth 2 (two) times daily.   Glucosamine HCl (GLUCOSAMINE PO) Take 2 tablets by mouth daily.   losartan (COZAAR) 50 MG tablet Take 100 mg by mouth daily.   Magnesium 400 MG CAPS Take 400 mg by mouth daily.   Melatonin 5 MG CAPS Take 10 mg by mouth at bedtime.   metformin (FORTAMET) 500 MG (OSM) 24 hr tablet Take 500 mg by mouth daily with breakfast.   metformin (FORTAMET) 500 MG (OSM) 24 hr tablet    pantoprazole (PROTONIX) 40 MG tablet Take 40 mg by mouth 2 (two) times daily.   rosuvastatin (CRESTOR) 20 MG tablet Take 20 mg by mouth daily.   Semaglutide-Weight Management (WEGOVY) 1 MG/0.5ML SOAJ Inject 1 mg into the skin once a  week.   Semaglutide-Weight Management (WEGOVY) 1 MG/0.5ML SOAJ Inject 1 mg into the skin once a week.   TURMERIC PO Take 2 tablets by mouth daily.   No current facility-administered medications for this visit. (Other)   REVIEW OF SYSTEMS: ROS   Positive for: Musculoskeletal, Cardiovascular, Eyes, Respiratory Negative for: Constitutional, Gastrointestinal, Neurological, Skin, Genitourinary, HENT, Endocrine, Psychiatric, Allergic/Imm, Heme/Lymph Last edited by Etheleen Mayhew, COT on 05/27/2023  2:58 PM.      ALLERGIES Allergies  Allergen Reactions   Codeine Itching   PAST MEDICAL HISTORY Past Medical History:  Diagnosis Date   Allergy    Arthritis    legs   Asthma    GERD (gastroesophageal reflux disease)    Hyperlipidemia    Hypertension    Past Surgical History:  Procedure Laterality Date   ABDOMINAL HYSTERECTOMY     CHOLECYSTECTOMY     COLONOSCOPY     GAS INSERTION Left 09/05/2021   Procedure: INSERTION OF GAS - C3F8;  Surgeon: Rennis Chris, MD;  Location: [facility] OR;  Service: Ophthalmology;  Laterality: Left;   GAS/FLUID EXCHANGE Left 09/05/2021   Procedure: GAS/FLUID EXCHANGE;  Surgeon: Rennis Chris, MD;  Location: [facility] OR;  Service: Ophthalmology;  Laterality: Left;   PARS PLANA VITRECTOMY Left 09/05/2021   Procedure: PARS PLANA VITRECTOMY WITH 25 GAUGE;  Surgeon: Rennis Chris, MD;  Location: [facility] OR;  Service: Ophthalmology;  Laterality: Left;   PERFLUORONE INJECTION Left 09/05/2021   Procedure: PERFLUORON INJECTION;  Surgeon: Rennis Chris, MD;  Location: [facility] OR;  Service: Ophthalmology;  Laterality: Left;   PHOTOCOAGULATION WITH LASER Left 09/05/2021   Procedure: PHOTOCOAGULATION WITH LASER;  Surgeon: Rennis Chris, MD;  Location: [facility] OR;  Service: Ophthalmology;  Laterality: Left;   SCLERAL BUCKLE Left 09/05/2021   Procedure: SCLERAL BUCKLE;  Surgeon: Rennis Chris, MD;  Location: [facility] OR;  Service: Ophthalmology;  Laterality: Left;   UPPER GASTROINTESTINAL ENDOSCOPY      FAMILY HISTORY Family History  Problem Relation Age of Onset   Retinal detachment Sister    Diabetes Sister    Glaucoma Brother    Diabetes Brother    Colon cancer Brother        dx at 19   Esophageal cancer Neg Hx    Rectal cancer Neg Hx    Stomach cancer Neg Hx    SOCIAL HISTORY Social History   Tobacco Use   Smoking status: Former   Smokeless tobacco: Never   Tobacco comments:    quit smoking in 2002 or 2003  Vaping Use   Vaping status: Never Used  Substance Use Topics   Alcohol use: Yes    Comment: very rarely   Drug use: No       OPHTHALMIC EXAM:  Base Eye Exam     Visual Acuity (Snellen - Linear)       Right Left   Dist [place] 20/20 -2 20/250 -1   Dist ph [place]  NI         Tonometry (Tonopen, 3:02 PM)       Right Left   Pressure 14 18         Pupils       Pupils Dark Light Shape React APD   Right PERRL 3 2 Round Brisk None   Left PERRL 3 2 Round Brisk None         Visual Fields       Left Right    Full Full         Extraocular Movement       Right Left    Full Full         Neuro/Psych     Oriented x3: Yes   Mood/Affect: Normal         Dilation     Both eyes: 2.5% Phenylephrine @ 3:02 PM           Slit Lamp and Fundus Exam     External Exam       Right Left   External  Periorbital edema         Slit Lamp Exam       Right Left   Lids/Lashes Dermatochalasis - upper lid Dermatochalasis - upper lid, periorbital erythema   Conjunctiva/Sclera white and quiet White and quiet, STK ST quad   Cornea trace PEE, mild tear film debris, well healed cataract wound trace PEE, mild tear film debris, well healed cataract wound, inferior paracentral corneal haze   Anterior Chamber deep and clear deep and clear   Iris round and dilated round and dilated   Lens PC IOL in good position PC IOL in good position   Anterior Vitreous syneresis, asteroid hyalosis, PVD post vitrectomy, clear -- no residual kenalog          Fundus Exam       Right Left   Disc Pink and Sharp mild Pallor, Sharp rim, Compact   C/D Ratio 0.4 0.3   Macula flat, good foveal reflex, mild RPE mottling, No heme or edema Flat, Blunted foveal reflex, RPE mottling, ERM with striae greatest nasal mac, diffuse edema/cystic changes centrally and nasally -- slightly improved, No heme   Vessels attenuated, mild tortuosity attenuated, Tortuous   Periphery Attached, operculated hole @ 1030 with mild surrounding pigment -- good laser surrounding, no SRF, no new RT/RD Attached, Trace pockets of SRF superior to disc with fibrosis, focal fibrosis at 0600 posterior to buckle, Retina attached over buckle, good buckle height, good laser over buckle and around tears, PRE-OP: small tear with fibrosis @ 0200, bullous, temporal RD from 1230 to 0430           Refraction     Wearing Rx       Sphere Cylinder Axis Add   Right +1.00 +1.00 130 +2.75   Left +1.25 +0.75 017 +2.75           IMAGING AND PROCEDURES  Imaging and Procedures for 05/27/2023  OCT, Retina - OU - Both Eyes       Right Eye Quality was good. Central Foveal Thickness: 274. Progression has been stable. Findings include normal foveal contour, no IRF, no SRF.   Left Eye Quality was good. Central Foveal Thickness: 612. Progression has improved. Findings include abnormal foveal contour,  epiretinal membrane, intraretinal fluid, macular pucker, subretinal fluid (ERM with blunted foveal contour and mild pucker, interval improvement in diffuse edema w/ +IRF/SRF ).   Notes *Images captured and stored on drive  Diagnosis / Impression:  OD: NFP; no IRF/SRF OS: ERM with blunted foveal contour and mild pucker, interval improvement in diffuse edema w/ +IRF/SRF   Clinical management:  See below  Abbreviations: NFP - Normal foveal profile. CME - cystoid macular edema. PED - pigment epithelial detachment. IRF - intraretinal fluid. SRF - subretinal fluid. EZ - ellipsoid zone. ERM -  epiretinal membrane. ORA - outer retinal atrophy. ORT - outer retinal tubulation. SRHM - subretinal hyper-reflective material. IRHM - intraretinal hyper-reflective material      Intravitreal Injection, Pharmacologic Agent - OS - Left Eye       Time Out 05/27/2023. 3:52 PM. Confirmed correct patient, procedure, site, and patient consented.   Anesthesia Topical anesthesia was used. Anesthetic medications included Lidocaine 2%, Proparacaine 0.5%.   Procedure Preparation included 5% betadine to ocular surface, eyelid speculum. A (32g) needle was used.   Injection: 4 mg triamcinolone acetonide 40 MG/ML   Route: Intravitreal, Site: Left Eye   NDC: [phone], Lot: [id number], Expiration date: 09/13/2024   Post-op Post injection exam found visual acuity of at least counting fingers. The patient tolerated the procedure well. There were no complications. The patient received written and verbal post procedure care education.   Notes An AC tap was performed following injection due to elevated IOP using a 30 gauge needle on a syringe with the plunger removed. The needle was placed at the limbus at 5 oclock and approximately 0.01cc of aqueous was removed from the anterior chamber. Betadine was applied to the tap area before and after the paracentesis was performed. There were no complications. The patient tolerated the procedure well. The IOP was rechecked and was found to be ~10 mmHg by digital palpation.            ASSESSMENT/PLAN:    ICD-10-CM   1. Left retinal detachment  H33.22     2. Cystoid macular edema of left eye  H35.352 OCT, Retina - OU - Both Eyes    Intravitreal Injection, Pharmacologic Agent - OS - Left Eye    triamcinolone acetonide (KENALOG-40) injection 4 mg    3. Epiretinal membrane (ERM) of left eye  H35.372     4. Retinal tear of right eye  H33.311     5. Essential hypertension  I10     6. Hypertensive retinopathy of both eyes  H35.033     7. 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 Rhegmatogenous retinal detachment with retinal hole, left eye - bullous, temporal, mac off detachment, onset of foveal involvement Tuesday, 03.14.23 by history - detached from 1230 to 430 oclock, fovea off, small tear at 0200 - s/p SBP + PPV/PFO/EL/FAX/14% C3F8 OS, 03.23.2023 - retina attached and in good position -- good buckle height and laser around breaks  - FA 12.18.23 shows OD Focal staining ST periphery corresponding to laser retinopexy, OS Mild Hyperfluorescence of disc, mild perifoveal staining SN to fovea--?petaloid -- suggestive of CME - BCVA OS 20/250 from 20/200 - OCT shows OS: ERM with blunted foveal contour and mild pucker, interval improvement in cystic changes/diffuse edema, central SRF  - s/p STK #1 (11.20.23) for CME, #2 (02.28.24), #3 (07.03.24) - s/p IVA OS #1 (05.01.24), #2 (05.29.24), #  3 (06.26.24) -- IVA resistance  - s/p IVE OS #1 (07.24.24), #2 (08.21.24), #3 (09.18.24), #4 (10.16.24)  - s/p IVK OS #1 (11.13.24) - IOP OS 18 today -- h/o steroid response - continue holding Prolensa (h/o medicamentosa)  - continue holding PF (h/o steroid response) -- cont Lotemax BID OS  - continue Brimonidine BID OS, Cosopt BID OS - discontinue Latanoprost due to possible contribution to CME - recommend IVK OS #2 today, 12.11.24.  - IVK informed consent obtained and signed, 11.13.24 - IVE informed consent obtained and signed, 07.24.24 (OS) - see procedure note -- AC tap performed - Eylea authorized for 2024 - f/u 4 weeks - DFE, OCT, possible injection (IVK)  3. Epiretinal membrane, left eye  - ERM with persistent cystic edema - OCT shows persistent ERM w/ cystic changes slightly increased - monitor for now, but discussed possibility of repeat PPV w/ membrane peel to remove ERM if vision fails to improve  4. Retinal hole OD  - operculated retinal hole located at 1030 with mild surrounding pigment, no  SRF - s/p laser retinopexy OD (03.16.23) -- good laser surrounding - continue to monitor  5,6. Hypertensive retinopathy OU - discussed importance of tight BP control - continue to monitor  7. Pseudophakia OU  - s/p CE/IOL OS (Dr. Zenaida Niece, 10.04.23), CE with IOL OD (11.01.23, Dr. Zenaida Niece)  - IOL in good position, doing well  - continue to monitor  8. Ocular hypertension OS  - likely steroid response  - tmax 33 on 01.29.24  - s/p SLT OS w/ Dr. Wynelle Link on 12.05.23  - IOP today: 18  - continue Brimonidine BID OS, Cosopt BID OS - discontinue Latanoprost due to possible contribution to CME  Ophthalmic Meds Ordered this visit:  Meds ordered this encounter  Medications   dorzolamide-timolol (COSOPT) 2-0.5 % ophthalmic solution    Sig: Place 1 drop into the left eye 2 (two) times daily.    Dispense:  10 mL    Refill:  3   triamcinolone acetonide (KENALOG-40) injection 4 mg     Return in about 4 weeks (around 06/24/2023) for CME OS, Dilated Exam, OCT, Possible Injxn.  There are no Patient Instructions on file for this visit.  Explained the diagnoses, plan, and follow up with the patient and they expressed understanding.  Patient expressed understanding of the importance of proper follow up care.   This document serves as a record of services personally performed by Karie Chimera, MD, PhD. It was created on their behalf by De Blanch, an ophthalmic technician. The creation of this record is the provider's dictation and/or activities during the visit.    Electronically signed by: De Blanch, OA, 05/27/23  4:28 PM  This document serves as a record of services personally performed by Karie Chimera, MD, PhD. It was created on their behalf by Glee Arvin. Manson Passey, OA an ophthalmic technician. The creation of this record is the provider's dictation and/or activities during the visit.    Electronically signed by: Glee Arvin. Manson Passey, OA 05/27/23 4:28 PM  Karie Chimera, M.D., Ph.D. Diseases &  Surgery of the Retina and Vitreous [facility]  I have reviewed the above documentation for accuracy and completeness, and I agree with the above. Karie Chimera, M.D., Ph.D. 05/27/23 4:28 PM   Abbreviations: M myopia (nearsighted); A astigmatism; H hyperopia (farsighted); P presbyopia; Mrx spectacle prescription;  CTL contact lenses; OD right eye; OS left eye; OU both eyes  XT  exotropia; ET esotropia; PEK punctate epithelial keratitis; PEE punctate epithelial erosions; DES dry eye syndrome; MGD meibomian gland dysfunction; ATs artificial tears; PFAT's preservative free artificial tears; NSC nuclear sclerotic cataract; PSC posterior subcapsular cataract; ERM epi-retinal membrane; PVD posterior vitreous detachment; RD retinal detachment; DM diabetes mellitus; DR diabetic retinopathy; NPDR non-proliferative diabetic retinopathy; PDR proliferative diabetic retinopathy; CSME clinically significant macular edema; DME diabetic macular edema; dbh dot blot hemorrhages; CWS cotton wool spot; POAG primary open angle glaucoma; C/D cup-to-disc ratio; HVF humphrey visual field; GVF goldmann visual field; OCT optical coherence tomography; IOP intraocular pressure; BRVO Branch retinal vein occlusion; CRVO central retinal vein occlusion; CRAO central retinal artery occlusion; BRAO branch retinal artery occlusion; RT retinal tear; SB scleral buckle; PPV pars plana vitrectomy; VH Vitreous hemorrhage; PRP panretinal laser photocoagulation; IVK intravitreal kenalog; VMT vitreomacular traction; MH Macular hole;  NVD neovascularization of the disc; NVE neovascularization elsewhere; AREDS age related eye disease study; ARMD age related macular degeneration; POAG primary open angle glaucoma; EBMD epithelial/anterior basement membrane dystrophy; ACIOL anterior chamber intraocular lens; IOL intraocular lens; PCIOL posterior chamber intraocular lens; Phaco/IOL phacoemulsification with intraocular lens placement;  PRK photorefractive keratectomy; LASIK laser assisted in situ keratomileusis; HTN hypertension; DM diabetes mellitus; COPD chronic obstructive pulmonary disease

## 2023-05-27 ENCOUNTER — Ambulatory Visit (INDEPENDENT_AMBULATORY_CARE_PROVIDER_SITE_OTHER): Payer: Medicare HMO | Admitting: Ophthalmology

## 2023-05-27 ENCOUNTER — Encounter (INDEPENDENT_AMBULATORY_CARE_PROVIDER_SITE_OTHER): Payer: Self-pay | Admitting: Ophthalmology

## 2023-05-27 DIAGNOSIS — Z961 Presence of intraocular lens: Secondary | ICD-10-CM | POA: Diagnosis not present

## 2023-05-27 DIAGNOSIS — H35033 Hypertensive retinopathy, bilateral: Secondary | ICD-10-CM

## 2023-05-27 DIAGNOSIS — I10 Essential (primary) hypertension: Secondary | ICD-10-CM | POA: Diagnosis not present

## 2023-05-27 DIAGNOSIS — H33311 Horseshoe tear of retina without detachment, right eye: Secondary | ICD-10-CM

## 2023-05-27 DIAGNOSIS — H35352 Cystoid macular degeneration, left eye: Secondary | ICD-10-CM | POA: Diagnosis not present

## 2023-05-27 DIAGNOSIS — H3322 Serous retinal detachment, left eye: Secondary | ICD-10-CM | POA: Diagnosis not present

## 2023-05-27 DIAGNOSIS — H35372 Puckering of macula, left eye: Secondary | ICD-10-CM

## 2023-05-27 DIAGNOSIS — H40052 Ocular hypertension, left eye: Secondary | ICD-10-CM | POA: Diagnosis not present

## 2023-05-27 MED ORDER — TRIAMCINOLONE ACETONIDE 40 MG/ML IJ SUSP FOR KALEIDOSCOPE
4.0000 mg | INTRAMUSCULAR | Status: AC | PRN
  Administered 2023-05-27: 4 mg via INTRAVITREAL

## 2023-05-27 MED ORDER — DORZOLAMIDE HCL-TIMOLOL MAL 2-0.5 % OP SOLN: 1.0000 [drp] | Freq: Two times a day (BID) | OPHTHALMIC | 3 refills | Status: DC

## 2023-05-28 ENCOUNTER — Other Ambulatory Visit (HOSPITAL_COMMUNITY): Payer: Self-pay

## 2023-05-28 ENCOUNTER — Other Ambulatory Visit (INDEPENDENT_AMBULATORY_CARE_PROVIDER_SITE_OTHER): Payer: Self-pay

## 2023-05-28 MED ORDER — DORZOLAMIDE HCL-TIMOLOL MAL 2-0.5 % OP SOLN
1.0000 [drp] | Freq: Two times a day (BID) | OPHTHALMIC | 3 refills | Status: DC
  Filled 2023-05-28: qty 10, 100d supply, fill #0
  Filled 2023-05-28: qty 10, 84d supply, fill #0
  Filled 2023-05-28: qty 10, 90d supply, fill #0
  Filled 2023-06-02: qty 10, 90d supply, fill #1

## 2023-06-01 ENCOUNTER — Other Ambulatory Visit (HOSPITAL_COMMUNITY): Payer: Self-pay

## 2023-06-01 MED ORDER — WEGOVY 1 MG/0.5ML ~~LOC~~ SOAJ
1.0000 mg | SUBCUTANEOUS | 0 refills | Status: DC
  Filled 2023-06-01 – 2023-06-08 (×3): qty 2, 28d supply, fill #0

## 2023-06-02 ENCOUNTER — Other Ambulatory Visit (HOSPITAL_COMMUNITY): Payer: Self-pay

## 2023-06-08 ENCOUNTER — Other Ambulatory Visit (HOSPITAL_COMMUNITY): Payer: Self-pay

## 2023-06-09 ENCOUNTER — Other Ambulatory Visit (HOSPITAL_COMMUNITY): Payer: Self-pay

## 2023-06-16 NOTE — Progress Notes (Signed)
 [facility] - Clinic Note  06/24/2023    CHIEF COMPLAINT Patient presents for Retina Follow Up   HISTORY OF PRESENT ILLNESS: Alicia Miranda is a 67 y.o. female who presents to the clinic today for:   HPI     Retina Follow Up   Patient presents with  Retinal Break/Detachment.  In left eye.  This started 4.  Duration of 4 weeks.  Since onset it is stable.  I, the attending physician,  performed the HPI with the patient and updated documentation appropriately.        Comments   4 week retina follow up CME and RD IVK OS/I'VE OS pt is reporting no vision changes noticed she denies any flashes or floaters pt is using Brimonidine  BID OS, Cosopt  BID OS and pred bid OS       Last edited by Valdemar Rogue, MD on 06/24/2023  4:26 PM.      Referring physician: Seabron Lenis, MD [phone] [address] [place],  [place] [address]  HISTORICAL INFORMATION:   Selected notes from the MEDICAL RECORD NUMBER Referred by Dr. Nanetta Sharps for possible RD OS LEE: 03/16/02023 Ocular Hx-  PMH- HTN    CURRENT MEDICATIONS: Current Outpatient Medications (Ophthalmic Drugs)  Medication Sig   brimonidine  (ALPHAGAN ) 0.2 % ophthalmic solution INSTILL 1 DROP INTO LEFT EYE THREE TIMES DAILY   Bromfenac  Sodium (PROLENSA ) 0.07 % SOLN Place 1 drop into the left eye daily. PT NEEDS 2 BOTTLES DISPENSED AT A TIME-CALL IF COUPON IS NEEDED   Bromfenac  Sodium 0.09 % SOLN Place 1 drop into the left eye 4 (four) times daily.   dorzolamide -timolol  (COSOPT ) 2-0.5 % ophthalmic solution Place 1 drop into the left eye 2 (two) times daily.   erythromycin  ophthalmic ointment Place 1 Application into the left eye at bedtime. (Patient not taking: Reported on 05/13/2023)   latanoprost  (XALATAN ) 0.005 % ophthalmic solution Place 1 drop into the left eye at bedtime.   loteprednol  (LOTEMAX ) 0.5 % ophthalmic suspension Place 1 drop into the left eye 2 (two) times daily.   LOTEPREDNOL  ETABONATE OP Place 1 drop  into the right eye 4 (four) times daily.   prednisoLONE  acetate (PRED FORTE ) 1 % ophthalmic suspension INSTILL 1 DROP INTO LEFT EYE 4 TIMES DAILY (Patient not taking: Reported on 05/13/2023)   prednisoLONE  acetate (PRED FORTE ) 1 % ophthalmic suspension Place 1 drop into the left eye 4 (four) times daily. (Patient not taking: Reported on 05/13/2023)   No current facility-administered medications for this visit. (Ophthalmic Drugs)   Current Outpatient Medications (Other)  Medication Sig   APPLE CIDER VINEGAR PO Take 2 tablets by mouth daily. gummies   Cholecalciferol (VITAMIN D3) 10 MCG (400 UNIT) tablet Take 400 Units by mouth daily.   CINNAMON PO Take 1,000 mg by mouth every other day.   COLLAGEN PO Take 3 capsules by mouth daily.   Denosumab  (PROLIA  Davey) Inject 60 mg into the skin every 6 (six) months.   FEROSUL 325 (65 Fe) MG tablet Take 325 mg by mouth daily.   fexofenadine (ALLEGRA) 180 MG tablet Take 180 mg by mouth daily.   fluticasone (FLONASE) 50 MCG/ACT nasal spray Place 1 spray into both nostrils daily.   furosemide (LASIX) 40 MG tablet Take 40 mg by mouth 2 (two) times daily.   Glucosamine HCl (GLUCOSAMINE PO) Take 2 tablets by mouth daily.   losartan (COZAAR) 50 MG tablet Take 100 mg by mouth daily.   Magnesium 400  MG CAPS Take 400 mg by mouth daily.   Melatonin 5 MG CAPS Take 10 mg by mouth at bedtime.   metformin (FORTAMET) 500 MG (OSM) 24 hr tablet Take 500 mg by mouth daily with breakfast.   metformin (FORTAMET) 500 MG (OSM) 24 hr tablet    pantoprazole (PROTONIX) 40 MG tablet Take 40 mg by mouth 2 (two) times daily.   rosuvastatin (CRESTOR) 20 MG tablet Take 20 mg by mouth daily.   Semaglutide -Weight Management (WEGOVY ) 1 MG/0.5ML SOAJ Inject 1 mg into the skin once a week.   Semaglutide -Weight Management (WEGOVY ) 1 MG/0.5ML SOAJ Inject 1 mg into the skin once a week.   TURMERIC PO Take 2 tablets by mouth daily.   No current facility-administered medications for this  visit. (Other)   REVIEW OF SYSTEMS: ROS   Positive for: Musculoskeletal, Cardiovascular, Eyes, Respiratory Negative for: Constitutional, Gastrointestinal, Neurological, Skin, Genitourinary, HENT, Endocrine, Psychiatric, Allergic/Imm, Heme/Lymph Last edited by Resa Delon ORN, COT on 06/24/2023  2:43 PM.       ALLERGIES Allergies  Allergen Reactions   Codeine Itching   PAST MEDICAL HISTORY Past Medical History:  Diagnosis Date   Allergy    Arthritis    legs   Asthma    GERD (gastroesophageal reflux disease)    Hyperlipidemia    Hypertension    Past Surgical History:  Procedure Laterality Date   ABDOMINAL HYSTERECTOMY     CHOLECYSTECTOMY     COLONOSCOPY     GAS INSERTION Left 09/05/2021   Procedure: INSERTION OF GAS - C3F8;  Surgeon: Valdemar Rogue, MD;  Location: [facility] OR;  Service: Ophthalmology;  Laterality: Left;   GAS/FLUID EXCHANGE Left 09/05/2021   Procedure: GAS/FLUID EXCHANGE;  Surgeon: Valdemar Rogue, MD;  Location: [facility] OR;  Service: Ophthalmology;  Laterality: Left;   PARS PLANA VITRECTOMY Left 09/05/2021   Procedure: PARS PLANA VITRECTOMY WITH 25 GAUGE;  Surgeon: Valdemar Rogue, MD;  Location: [facility] OR;  Service: Ophthalmology;  Laterality: Left;   PERFLUORONE INJECTION Left 09/05/2021   Procedure: PERFLUORON INJECTION;  Surgeon: Valdemar Rogue, MD;  Location: [facility] OR;  Service: Ophthalmology;  Laterality: Left;   PHOTOCOAGULATION WITH LASER Left 09/05/2021   Procedure: PHOTOCOAGULATION WITH LASER;  Surgeon: Valdemar Rogue, MD;  Location: [facility] OR;  Service: Ophthalmology;  Laterality: Left;   SCLERAL BUCKLE Left 09/05/2021   Procedure: SCLERAL BUCKLE;  Surgeon: Valdemar Rogue, MD;  Location: [facility] OR;  Service: Ophthalmology;  Laterality: Left;   UPPER GASTROINTESTINAL ENDOSCOPY     FAMILY HISTORY Family History  Problem Relation Age of Onset   Retinal detachment Sister    Diabetes Sister    Glaucoma Brother    Diabetes Brother    Colon cancer Brother        dx at 54    Esophageal cancer Neg Hx    Rectal cancer Neg Hx    Stomach cancer Neg Hx    SOCIAL HISTORY Social History   Tobacco Use   Smoking status: Former   Smokeless tobacco: Never   Tobacco comments:    quit smoking in 2002 or 2003  Vaping Use   Vaping status: Never Used  Substance Use Topics   Alcohol use: Yes    Comment: very rarely   Drug use: No       OPHTHALMIC EXAM:  Base Eye Exam     Visual Acuity (Snellen - Linear)       Right Left   Dist [place] 20/25 -2 20/300 -1   Dist ph   NI NI         Tonometry (Tonopen, 2:48 PM)       Right Left   Pressure 12 18         Pupils       Pupils Dark Light Shape React APD   Right PERRL 3 2 Round Brisk None   Left PERRL 3 2 Round Brisk None         Visual Fields       Left Right    Full Full         Extraocular Movement       Right Left    Full, Ortho Full, Ortho         Neuro/Psych     Oriented x3: Yes   Mood/Affect: Normal         Dilation     Both eyes: 2.5% Phenylephrine  @ 2:48 PM           Slit Lamp and Fundus Exam     External Exam       Right Left   External  Periorbital edema         Slit Lamp Exam       Right Left   Lids/Lashes Dermatochalasis - upper lid Dermatochalasis - upper lid, periorbital erythema   Conjunctiva/Sclera white and quiet White and quiet, STK ST quad   Cornea trace PEE, mild tear film debris, well healed cataract wound trace PEE, mild tear film debris, well healed cataract wound, inferior paracentral corneal haze   Anterior Chamber deep and clear deep and clear   Iris round and dilated round and dilated   Lens PC IOL in good position PC IOL in good position   Anterior Vitreous syneresis, asteroid hyalosis, PVD post vitrectomy, clear -- no residual kenalog          Fundus Exam       Right Left   Disc Pink and Sharp mild Pallor, Sharp rim, Compact   C/D Ratio 0.4 0.3   Macula flat, good foveal reflex, mild RPE mottling, No heme or edema Flat, Blunted  foveal reflex, RPE mottling, ERM with striae greatest nasal mac, diffuse edema/cystic changes centrally and nasally -- slightly improved, No heme   Vessels attenuated, mild tortuosity attenuated, Tortuous   Periphery Attached, operculated hole @ 1030 with mild surrounding pigment -- good laser surrounding, no SRF, no new RT/RD Attached, Trace pockets of SRF superior to disc with fibrosis, focal fibrosis at 0600 posterior to buckle, Retina attached over buckle, good buckle height, good laser over buckle and around tears, PRE-OP: small tear with fibrosis @ 0200, bullous, temporal RD from 1230 to 0430           IMAGING AND PROCEDURES  Imaging and Procedures for 06/24/2023  OCT, Retina - OU - Both Eyes       Right Eye Quality was good. Central Foveal Thickness: 276. Progression has been stable. Findings include normal foveal contour, no IRF, no SRF.   Left Eye Quality was poor. Central Foveal Thickness: 678. Progression has improved. Findings include abnormal foveal contour, epiretinal membrane, intraretinal fluid, macular pucker, subretinal fluid (ERM with blunted foveal contour and mild pucker, mild interval improvement in diffuse edema w/ +IRF/SRF ).   Notes *Images captured and stored on drive  Diagnosis / Impression:  OD: NFP; no IRF/SRF OS: ERM with blunted foveal contour and mild pucker, mild interval improvement in diffuse edema w/ +IRF/SRF   Clinical management:  See below  Abbreviations: NFP - Normal  foveal profile. CME - cystoid macular edema. PED - pigment epithelial detachment. IRF - intraretinal fluid. SRF - subretinal fluid. EZ - ellipsoid zone. ERM - epiretinal membrane. ORA - outer retinal atrophy. ORT - outer retinal tubulation. SRHM - subretinal hyper-reflective material. IRHM - intraretinal hyper-reflective material      Intravitreal Injection, Pharmacologic Agent - OS - Left Eye       Time Out 06/24/2023. 3:28 PM. Confirmed correct patient, procedure, site, and  patient consented.   Anesthesia Topical anesthesia was used. Anesthetic medications included Lidocaine  2%, Proparacaine  0.5%.   Procedure Preparation included 5% betadine to ocular surface, eyelid speculum. A (32g) needle was used.   Injection: 4 mg triamcinolone  acetonide 40 MG/ML   Route: Intravitreal, Site: Left Eye   NDC: 9296-9758-98, Lot: [id number], Expiration date: 10/13/2024   Post-op Post injection exam found visual acuity of at least counting fingers. The patient tolerated the procedure well. There were no complications. The patient received written and verbal post procedure care education.   Notes An AC tap was performed following injection due to elevated IOP using a 30 gauge needle on a syringe with the plunger removed. The needle was placed at the limbus at 5 oclock and approximately 0.01cc of aqueous was removed from the anterior chamber. Betadine was applied to the tap area before and after the paracentesis was performed. There were no complications. The patient tolerated the procedure well. The IOP was rechecked and was found to be ~10 mmHg by digital palpation.            ASSESSMENT/PLAN:    ICD-10-CM   1. Left retinal detachment  H33.22     2. Cystoid macular edema of left eye  H35.352 OCT, Retina - OU - Both Eyes    Intravitreal Injection, Pharmacologic Agent - OS - Left Eye    triamcinolone  acetonide (KENALOG -40) injection 4 mg    3. Epiretinal membrane (ERM) of left eye  H35.372 OCT, Retina - OU - Both Eyes    4. Retinal tear of right eye  H33.311     5. Essential hypertension  I10     6. Hypertensive retinopathy of both eyes  H35.033     7. 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 Rhegmatogenous retinal detachment with retinal hole, left eye - bullous, temporal, mac off detachment, onset of foveal involvement Tuesday, 03.14.23 by history - detached from 1230 to 430 oclock, fovea off, small tear at  0200 - s/p SBP + PPV/PFO/EL/FAX/14% C3F8 OS, 03.23.2023 - retina attached and in good position -- good buckle height and laser around breaks  - FA 12.18.23 shows OD Focal staining ST periphery corresponding to laser retinopexy, OS Mild Hyperfluorescence of disc, mild perifoveal staining SN to fovea--?petaloid -- suggestive of CME - BCVA OS 20/250 from 20/200 - OCT shows OS: ERM with blunted foveal contour and mild pucker, interval improvement in cystic changes/diffuse edema, central SRF  - s/p STK #1 (11.20.23) for CME, #2 (02.28.24), #3 (07.03.24) - s/p IVA OS #1 (05.01.24), #2 (05.29.24), #3 (06.26.24) -- IVA resistance  - s/p IVE OS #1 (07.24.24), #2 (08.21.24), #3 (09.18.24), #4 (10.16.24)  - s/p IVK OS #1 (11.13.24), #2 (12.11.24) - IOP OS 18 today -- h/o steroid response - continue holding Prolensa  (h/o medicamentosa)  - continue holding PF (h/o steroid response) -- cont Lotemax  BID OS  - continue Brimonidine  BID OS, Cosopt  BID OS -  discontinue Latanoprost  due to possible contribution to CME - recommend IVK OS #3 today, 01.08.25.  - IVK informed consent obtained and signed, 11.13.24 - IVE informed consent obtained and signed, 07.24.24 (OS) - see procedure note -- AC tap performed - Eylea  authorized for 2024 -- [employer] funding unavailable at this time - f/u 4 weeks - DFE, OCT, possible injection (IVK)  3. Epiretinal membrane, left eye  - ERM with persistent cystic edema - OCT shows persistent ERM w/ cystic changes slightly increased - monitor for now, but discussed possibility of repeat PPV w/ membrane peel to remove ERM if vision fails to improve  4. Retinal hole OD  - operculated retinal hole located at 1030 with mild surrounding pigment, no SRF - s/p laser retinopexy OD (03.16.23) -- good laser surrounding - continue to monitor  5,6. Hypertensive retinopathy OU - discussed importance of tight BP control - continue to monitor  7. Pseudophakia OU  - s/p CE/IOL OS (Dr.  Fleeta, 10.04.23), CE with IOL OD (11.01.23, Dr. Fleeta)  - IOL in good position, doing well  - continue to monitor  8. Ocular hypertension OS  - likely steroid response  - tmax 33 on 01.29.24  - s/p SLT OS w/ Dr. Austin on 12.05.23  - IOP today: 18  - continue Brimonidine  BID OS, Cosopt  BID OS - discontinue Latanoprost  due to possible contribution to CME  Ophthalmic Meds Ordered this visit:  Meds ordered this encounter  Medications   triamcinolone  acetonide (KENALOG -40) injection 4 mg     Return in about 4 weeks (around 07/22/2023) for f/u RD, CME OS - DFE, OCT, Possible Injxn.  There are no Patient Instructions on file for this visit.  Explained the diagnoses, plan, and follow up with the patient and they expressed understanding.  Patient expressed understanding of the importance of proper follow up care.   This document serves as a record of services personally performed by Redell JUDITHANN Hans, MD, PhD. It was created on their behalf by Almetta Pesa, an ophthalmic technician. The creation of this record is the provider's dictation and/or activities during the visit.    Electronically signed by: Almetta Pesa, OA, 06/24/23  4:28 PM   Redell JUDITHANN Hans, M.D., Ph.D. Diseases & Surgery of the Retina and Vitreous [facility]  I have reviewed the above documentation for accuracy and completeness, and I agree with the above. Redell JUDITHANN Hans, M.D., Ph.D. 06/24/23 4:29 PM   Abbreviations: M myopia (nearsighted); A astigmatism; H hyperopia (farsighted); P presbyopia; Mrx spectacle prescription;  CTL contact lenses; OD right eye; OS left eye; OU both eyes  XT exotropia; ET esotropia; PEK punctate epithelial keratitis; PEE punctate epithelial erosions; DES dry eye syndrome; MGD meibomian gland dysfunction; ATs artificial tears; PFAT's preservative free artificial tears; NSC nuclear sclerotic cataract; PSC posterior subcapsular cataract; ERM epi-retinal membrane; PVD posterior  vitreous detachment; RD retinal detachment; DM diabetes mellitus; DR diabetic retinopathy; NPDR non-proliferative diabetic retinopathy; PDR proliferative diabetic retinopathy; CSME clinically significant macular edema; DME diabetic macular edema; dbh dot blot hemorrhages; CWS cotton wool spot; POAG primary open angle glaucoma; C/D cup-to-disc ratio; HVF humphrey visual field; GVF goldmann visual field; OCT optical coherence tomography; IOP intraocular pressure; BRVO Branch retinal vein occlusion; CRVO central retinal vein occlusion; CRAO central retinal artery occlusion; BRAO branch retinal artery occlusion; RT retinal tear; SB scleral buckle; PPV pars plana vitrectomy; VH Vitreous hemorrhage; PRP panretinal laser photocoagulation; IVK intravitreal kenalog ; VMT vitreomacular traction; MH Macular hole;  NVD neovascularization of the disc; NVE neovascularization elsewhere; AREDS age related eye disease study; ARMD age related macular degeneration; POAG primary open angle glaucoma; EBMD epithelial/anterior basement membrane dystrophy; ACIOL anterior chamber intraocular lens; IOL intraocular lens; PCIOL posterior chamber intraocular lens; Phaco/IOL phacoemulsification with intraocular lens placement; PRK photorefractive keratectomy; LASIK laser assisted in situ keratomileusis; HTN hypertension; DM diabetes mellitus; COPD chronic obstructive pulmonary disease

## 2023-06-24 ENCOUNTER — Ambulatory Visit (INDEPENDENT_AMBULATORY_CARE_PROVIDER_SITE_OTHER): Payer: Medicare HMO | Admitting: Ophthalmology

## 2023-06-24 ENCOUNTER — Encounter (INDEPENDENT_AMBULATORY_CARE_PROVIDER_SITE_OTHER): Payer: Self-pay | Admitting: Ophthalmology

## 2023-06-24 DIAGNOSIS — H40052 Ocular hypertension, left eye: Secondary | ICD-10-CM | POA: Diagnosis not present

## 2023-06-24 DIAGNOSIS — H35352 Cystoid macular degeneration, left eye: Secondary | ICD-10-CM | POA: Diagnosis not present

## 2023-06-24 DIAGNOSIS — Z961 Presence of intraocular lens: Secondary | ICD-10-CM

## 2023-06-24 DIAGNOSIS — H35372 Puckering of macula, left eye: Secondary | ICD-10-CM | POA: Diagnosis not present

## 2023-06-24 DIAGNOSIS — H33311 Horseshoe tear of retina without detachment, right eye: Secondary | ICD-10-CM | POA: Diagnosis not present

## 2023-06-24 DIAGNOSIS — H35033 Hypertensive retinopathy, bilateral: Secondary | ICD-10-CM | POA: Diagnosis not present

## 2023-06-24 DIAGNOSIS — I10 Essential (primary) hypertension: Secondary | ICD-10-CM | POA: Diagnosis not present

## 2023-06-24 DIAGNOSIS — H3322 Serous retinal detachment, left eye: Secondary | ICD-10-CM

## 2023-06-24 MED ORDER — TRIAMCINOLONE ACETONIDE 40 MG/ML IJ SUSP FOR KALEIDOSCOPE
4.0000 mg | INTRAMUSCULAR | Status: AC | PRN
  Administered 2023-06-24: 4 mg via INTRAVITREAL

## 2023-07-16 NOTE — Progress Notes (Signed)
 [facility] - Clinic Note  07/22/2023    CHIEF COMPLAINT Patient presents for Retina Follow Up   HISTORY OF PRESENT ILLNESS: Alicia Miranda is a 68 y.o. female who presents to the clinic today for:   HPI     Retina Follow Up   Patient presents with  Retinal Break/Detachment.  In left eye.  This started 4 weeks ago.  Duration of 4 weeks.  Since onset it is stable.  I, the attending physician,  performed the HPI with the patient and updated documentation appropriately.        Comments   4 week retina follow up RD OS and IVK OS pt is reporting no vision changes noticed she denies any flashes or floaters pt is using Lotemax  BID OS Brimonidine  BID OS, Cosopt  BID OS       Last edited by Valdemar Rogue, MD on 07/22/2023  2:21 PM.    Pt states left eye is the same, Cosopt  irritates it occasionally  Referring physician: Seabron Lenis, MD [phone] [address] [place],  [place] [address]  HISTORICAL INFORMATION:   Selected notes from the MEDICAL RECORD NUMBER Referred by Dr. Nanetta Sharps for possible RD OS LEE: 03/16/02023 Ocular Hx-  PMH- HTN    CURRENT MEDICATIONS: Current Outpatient Medications (Ophthalmic Drugs)  Medication Sig   brimonidine  (ALPHAGAN ) 0.2 % ophthalmic solution INSTILL 1 DROP INTO LEFT EYE THREE TIMES DAILY   Bromfenac  Sodium (PROLENSA ) 0.07 % SOLN Place 1 drop into the left eye daily. PT NEEDS 2 BOTTLES DISPENSED AT A TIME-CALL IF COUPON IS NEEDED   Bromfenac  Sodium 0.09 % SOLN Place 1 drop into the left eye 4 (four) times daily.   dorzolamide -timolol  (COSOPT ) 2-0.5 % ophthalmic solution Place 1 drop into the left eye 3 (three) times daily.   erythromycin  ophthalmic ointment Place 1 Application into the left eye at bedtime. (Patient not taking: Reported on 05/13/2023)   latanoprost  (XALATAN ) 0.005 % ophthalmic solution Place 1 drop into the left eye at bedtime.   loteprednol  (LOTEMAX ) 0.5 % ophthalmic suspension Place 1 drop into the left  eye 2 (two) times daily.   LOTEPREDNOL  ETABONATE OP Place 1 drop into the right eye 4 (four) times daily.   prednisoLONE  acetate (PRED FORTE ) 1 % ophthalmic suspension INSTILL 1 DROP INTO LEFT EYE 4 TIMES DAILY (Patient not taking: Reported on 05/13/2023)   prednisoLONE  acetate (PRED FORTE ) 1 % ophthalmic suspension Place 1 drop into the left eye 4 (four) times daily. (Patient not taking: Reported on 05/13/2023)   No current facility-administered medications for this visit. (Ophthalmic Drugs)   Current Outpatient Medications (Other)  Medication Sig   APPLE CIDER VINEGAR PO Take 2 tablets by mouth daily. gummies   Cholecalciferol (VITAMIN D3) 10 MCG (400 UNIT) tablet Take 400 Units by mouth daily.   CINNAMON PO Take 1,000 mg by mouth every other day.   COLLAGEN PO Take 3 capsules by mouth daily.   Denosumab  (PROLIA  [place]) Inject 60 mg into the skin every 6 (six) months.   FEROSUL 325 (65 Fe) MG tablet Take 325 mg by mouth daily.   fexofenadine (ALLEGRA) 180 MG tablet Take 180 mg by mouth daily.   fluticasone (FLONASE) 50 MCG/ACT nasal spray Place 1 spray into both nostrils daily.   furosemide (LASIX) 40 MG tablet Take 40 mg by mouth 2 (two) times daily.   Glucosamine HCl (GLUCOSAMINE PO) Take 2 tablets by mouth daily.   losartan (COZAAR) 50 MG tablet  Take 100 mg by mouth daily.   Magnesium 400 MG CAPS Take 400 mg by mouth daily.   Melatonin 5 MG CAPS Take 10 mg by mouth at bedtime.   metformin (FORTAMET) 500 MG (OSM) 24 hr tablet Take 500 mg by mouth daily with breakfast.   metformin (FORTAMET) 500 MG (OSM) 24 hr tablet    pantoprazole (PROTONIX) 40 MG tablet Take 40 mg by mouth 2 (two) times daily.   rosuvastatin (CRESTOR) 20 MG tablet Take 20 mg by mouth daily.   Semaglutide -Weight Management (WEGOVY ) 1 MG/0.5ML SOAJ Inject 1 mg into the skin once a week.   Semaglutide -Weight Management (WEGOVY ) 1 MG/0.5ML SOAJ inject 1 mg under the skin once weekly   TURMERIC PO Take 2 tablets by mouth  daily.   No current facility-administered medications for this visit. (Other)   REVIEW OF SYSTEMS: ROS   Positive for: Musculoskeletal, Cardiovascular, Eyes, Respiratory Negative for: Constitutional, Gastrointestinal, Neurological, Skin, Genitourinary, HENT, Endocrine, Psychiatric, Allergic/Imm, Heme/Lymph Last edited by Resa Delon ORN, COT on 07/22/2023  2:11 PM.     ALLERGIES Allergies  Allergen Reactions   Codeine Itching   PAST MEDICAL HISTORY Past Medical History:  Diagnosis Date   Allergy    Arthritis    legs   Asthma    GERD (gastroesophageal reflux disease)    Hyperlipidemia    Hypertension    Past Surgical History:  Procedure Laterality Date   ABDOMINAL HYSTERECTOMY     CHOLECYSTECTOMY     COLONOSCOPY     GAS INSERTION Left 09/05/2021   Procedure: INSERTION OF GAS - C3F8;  Surgeon: Valdemar Rogue, MD;  Location: [facility] OR;  Service: Ophthalmology;  Laterality: Left;   GAS/FLUID EXCHANGE Left 09/05/2021   Procedure: GAS/FLUID EXCHANGE;  Surgeon: Valdemar Rogue, MD;  Location: [facility] OR;  Service: Ophthalmology;  Laterality: Left;   PARS PLANA VITRECTOMY Left 09/05/2021   Procedure: PARS PLANA VITRECTOMY WITH 25 GAUGE;  Surgeon: Valdemar Rogue, MD;  Location: [facility] OR;  Service: Ophthalmology;  Laterality: Left;   PERFLUORONE INJECTION Left 09/05/2021   Procedure: PERFLUORON INJECTION;  Surgeon: Valdemar Rogue, MD;  Location: [facility] OR;  Service: Ophthalmology;  Laterality: Left;   PHOTOCOAGULATION WITH LASER Left 09/05/2021   Procedure: PHOTOCOAGULATION WITH LASER;  Surgeon: Valdemar Rogue, MD;  Location: [facility] OR;  Service: Ophthalmology;  Laterality: Left;   SCLERAL BUCKLE Left 09/05/2021   Procedure: SCLERAL BUCKLE;  Surgeon: Valdemar Rogue, MD;  Location: [facility] OR;  Service: Ophthalmology;  Laterality: Left;   UPPER GASTROINTESTINAL ENDOSCOPY     FAMILY HISTORY Family History  Problem Relation Age of Onset   Retinal detachment Sister    Diabetes Sister    Glaucoma Brother     Diabetes Brother    Colon cancer Brother        dx at 17   Esophageal cancer Neg Hx    Rectal cancer Neg Hx    Stomach cancer Neg Hx    SOCIAL HISTORY Social History   Tobacco Use   Smoking status: Former   Smokeless tobacco: Never   Tobacco comments:    quit smoking in 2002 or 2003  Vaping Use   Vaping status: Never Used  Substance Use Topics   Alcohol use: Yes    Comment: very rarely   Drug use: No       OPHTHALMIC EXAM:  Base Eye Exam     Visual Acuity (Snellen - Linear)       Right Left   Dist [place] 20/25 -1  20/400   Dist ph [place] NI NI         Tonometry (Tonopen, 2:15 PM)       Right Left   Pressure 15 35  1 gtts dorz/brim        Pupils       Pupils Dark Light Shape React APD   Right PERRL 3 2 Round Brisk None   Left PERRL 3 2 Round Brisk None         Visual Fields       Left Right    Full Full         Extraocular Movement       Right Left    Full, Ortho Full, Ortho         Neuro/Psych     Oriented x3: Yes   Mood/Affect: Normal         Dilation     Both eyes: 2.5% Phenylephrine  @ 2:16 PM           Slit Lamp and Fundus Exam     External Exam       Right Left   External  Periorbital edema         Slit Lamp Exam       Right Left   Lids/Lashes Dermatochalasis - upper lid Dermatochalasis - upper lid, periorbital erythema   Conjunctiva/Sclera white and quiet White and quiet, STK ST quad   Cornea trace PEE, mild tear film debris, well healed cataract wound trace PEE, mild tear film debris, well healed cataract wound, inferior paracentral corneal haze   Anterior Chamber deep and clear deep and clear   Iris round and dilated round and dilated   Lens PC IOL in good position PC IOL in good position   Anterior Vitreous syneresis, asteroid hyalosis, PVD post vitrectomy, clear -- no residual kenalog          Fundus Exam       Right Left   Disc Pink and Sharp mild Pallor, Sharp rim, Compact   C/D Ratio 0.4 0.3    Macula flat, good foveal reflex, mild RPE mottling, No heme or edema Flat, Blunted foveal reflex, RPE mottling, ERM with striae greatest nasal mac, diffuse edema/cystic changes centrally and nasally -- slightly increased, No heme   Vessels attenuated, Tortuous attenuated, Tortuous   Periphery Attached, operculated hole @ 1030 with mild surrounding pigment -- good laser surrounding, no SRF, no new RT/RD Attached, Trace pockets of SRF superior to disc with fibrosis, focal fibrosis at 0600 posterior to buckle, Retina attached over buckle, good buckle height, good laser over buckle and around tears, PRE-OP: small tear with fibrosis @ 0200, bullous, temporal RD from 1230 to 0430           IMAGING AND PROCEDURES  Imaging and Procedures for 07/22/2023  OCT, Retina - OU - Both Eyes       Right Eye Quality was good. Central Foveal Thickness: 272. Progression has been stable. Findings include normal foveal contour, no IRF, no SRF.   Left Eye Quality was good. Central Foveal Thickness: 735. Progression has worsened. Findings include abnormal foveal contour, epiretinal membrane, intraretinal fluid, macular pucker, subretinal fluid (ERM with blunted foveal contour and mild pucker, persistent diffuse edema w/ +IRF/SRF -- slightly increased).   Notes *Images captured and stored on drive  Diagnosis / Impression:  OD: NFP; no IRF/SRF OS: ERM with blunted foveal contour and mild pucker, persistent diffuse edema w/ +IRF/SRF -- slightly increased  Clinical management:  See  below  Abbreviations: NFP - Normal foveal profile. CME - cystoid macular edema. PED - pigment epithelial detachment. IRF - intraretinal fluid. SRF - subretinal fluid. EZ - ellipsoid zone. ERM - epiretinal membrane. ORA - outer retinal atrophy. ORT - outer retinal tubulation. SRHM - subretinal hyper-reflective material. IRHM - intraretinal hyper-reflective material      Intravitreal Injection, Pharmacologic Agent - OS - Left Eye        Time Out 07/22/2023. 3:01 PM. Confirmed correct patient, procedure, site, and patient consented.   Anesthesia Topical anesthesia was used. Anesthetic medications included Lidocaine  2%, Proparacaine  0.5%.   Procedure Preparation included 5% betadine to ocular surface, eyelid speculum. A (32g) needle was used.   Injection: 6 mg faricimab -svoa 6 MG/0.05ML (Patient supplied)   Route: Intravitreal, Site: Left Eye   NDC: 49757-903-93, Lot: [id number], Expiration date: 02/12/2025, Waste: 0 mL   Post-op Post injection exam found visual acuity of at least counting fingers. The patient tolerated the procedure well. There were no complications. The patient received written and verbal post procedure care education.   Notes **SAMPLE MEDICATION ADMINISTERED**   An AC tap was performed following injection due to elevated IOP using a 30 gauge needle on a syringe with the plunger removed. The needle was placed at the limbus at 5 oclock and approximately 0.11cc of aqueous was removed from the anterior chamber. Betadine was applied to the tap area before and after the paracentesis was performed. There were no complications. The patient tolerated the procedure well. The IOP was rechecked and was found to be ~10 mmHg by digital palpation.            ASSESSMENT/PLAN:    ICD-10-CM   1. Left retinal detachment  H33.22     2. Cystoid macular edema of left eye  H35.352 OCT, Retina - OU - Both Eyes    Intravitreal Injection, Pharmacologic Agent - OS - Left Eye    faricimab -svoa (VABYSMO ) 6mg /0.70mL intravitreal injection    3. Epiretinal membrane (ERM) of left eye  H35.372     4. Retinal tear of right eye  H33.311     5. Essential hypertension  I10     6. Hypertensive retinopathy of both eyes  H35.033     7. 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 Rhegmatogenous retinal detachment with retinal hole, left eye - bullous, temporal, mac off  detachment, onset of foveal involvement Tuesday, 03.14.23 by history - detached from 1230 to 430 oclock, fovea off, small tear at 0200 - s/p SBP + PPV/PFO/EL/FAX/14% C3F8 OS, 03.23.2023 - retina attached and in good position -- good buckle height and laser around breaks  - FA 12.18.23 shows OD Focal staining ST periphery corresponding to laser retinopexy, OS Mild Hyperfluorescence of disc, mild perifoveal staining SN to fovea--?petaloid -- suggestive of CME - BCVA OS 20/250 from 20/200 - OCT shows OS: ERM with blunted foveal contour and mild pucker, persistent cystic changes/diffuse edema -- slightly increased, central SRF  - s/p STK #1 (11.20.23) for CME, #2 (02.28.24), #3 (07.03.24) - s/p IVA OS #1 (05.01.24), #2 (05.29.24), #3 (06.26.24) -- IVA resistance  - s/p IVE OS #1 (07.24.24), #2 (08.21.24), #3 (09.18.24), #4 (10.16.24)  - s/p IVK OS #1 (11.13.24), #2 (12.11.24), #3 (01.08.25) - IOP OS 35 today -- h/o steroid response - continue holding Prolensa  (h/o medicamentosa)  - continue holding PF (h/o steroid response) -- cont Lotemax   BID OS  - continue Brimonidine  BID OS, Cosopt  BID OS -- increase both to TID OS - discontinue Latanoprost  due to possible contribution to CME - recommend switching to IVV OS #1 [sample] today, 02.05.25  - IVK informed consent obtained and signed, 11.13.24 - IVE informed consent obtained and signed, 07.24.24 (OS) - IVV informed consent obtained and signed, 02.05.25 - see procedure note -- AC tap performed - Eylea  authorized for 2025 -- [employer] funding unavailable at this time - f/u 4 weeks - DFE, OCT, possible injection (IVK)  3. Epiretinal membrane, left eye  - ERM with persistent cystic edema - OCT shows persistent ERM w/ cystic changes slightly increased - monitor for now, but discussed possibility of repeat PPV w/ membrane peel to remove ERM if vision fails to improve  4. Retinal hole OD  - operculated retinal hole located at 1030 with mild  surrounding pigment, no SRF - s/p laser retinopexy OD (03.16.23) -- good laser surrounding - continue to monitor  5,6. Hypertensive retinopathy OU - discussed importance of tight BP control - continue to monitor  7. Pseudophakia OU  - s/p CE/IOL OS (Dr. Fleeta, 10.04.23), CE with IOL OD (11.01.23, Dr. Fleeta)  - IOL in good position, doing well  - continue to monitor  8. Ocular hypertension OS  - likely steroid response  - tmax 33 on 01.29.24  - s/p SLT OS w/ Dr. Austin on 12.05.23  - IOP today: 35 -- new tmax  - continue Brimonidine  BID OS, Cosopt  BID OS -- increase both to TID OS - discontinue Latanoprost  due to possible contribution to CME  Ophthalmic Meds Ordered this visit:  Meds ordered this encounter  Medications   brimonidine  (ALPHAGAN ) 0.2 % ophthalmic solution    Sig: INSTILL 1 DROP INTO LEFT EYE THREE TIMES DAILY    Dispense:  10 mL    Refill:  5   dorzolamide -timolol  (COSOPT ) 2-0.5 % ophthalmic solution    Sig: Place 1 drop into the left eye 3 (three) times daily.    Dispense:  10 mL    Refill:  5   faricimab -svoa (VABYSMO ) 6mg /0.36mL intravitreal injection     Return in about 4 weeks (around 08/19/2023) for f/u RD OS, DFE, OCT.  There are no Patient Instructions on file for this visit.  Explained the diagnoses, plan, and follow up with the patient and they expressed understanding.  Patient expressed understanding of the importance of proper follow up care.   This document serves as a record of services personally performed by Redell JUDITHANN Hans, MD, PhD. It was created on their behalf by Alan PARAS. Delores, OA an ophthalmic technician. The creation of this record is the provider's dictation and/or activities during the visit.    Electronically signed by: Alan PARAS. Delores, OA 07/22/23 4:34 PM  Redell JUDITHANN Hans, M.D., Ph.D. Diseases & Surgery of the Retina and Vitreous [facility]  I have reviewed the above documentation for accuracy and completeness,  and I agree with the above. Redell JUDITHANN Hans, M.D., Ph.D. 07/22/23 4:36 PM   Abbreviations: M myopia (nearsighted); A astigmatism; H hyperopia (farsighted); P presbyopia; Mrx spectacle prescription;  CTL contact lenses; OD right eye; OS left eye; OU both eyes  XT exotropia; ET esotropia; PEK punctate epithelial keratitis; PEE punctate epithelial erosions; DES dry eye syndrome; MGD meibomian gland dysfunction; ATs artificial tears; PFAT's preservative free artificial tears; NSC nuclear sclerotic cataract; PSC posterior subcapsular cataract; ERM epi-retinal membrane; PVD posterior vitreous detachment;  RD retinal detachment; DM diabetes mellitus; DR diabetic retinopathy; NPDR non-proliferative diabetic retinopathy; PDR proliferative diabetic retinopathy; CSME clinically significant macular edema; DME diabetic macular edema; dbh dot blot hemorrhages; CWS cotton wool spot; POAG primary open angle glaucoma; C/D cup-to-disc ratio; HVF humphrey visual field; GVF goldmann visual field; OCT optical coherence tomography; IOP intraocular pressure; BRVO Branch retinal vein occlusion; CRVO central retinal vein occlusion; CRAO central retinal artery occlusion; BRAO branch retinal artery occlusion; RT retinal tear; SB scleral buckle; PPV pars plana vitrectomy; VH Vitreous hemorrhage; PRP panretinal laser photocoagulation; IVK intravitreal kenalog ; VMT vitreomacular traction; MH Macular hole;  NVD neovascularization of the disc; NVE neovascularization elsewhere; AREDS age related eye disease study; ARMD age related macular degeneration; POAG primary open angle glaucoma; EBMD epithelial/anterior basement membrane dystrophy; ACIOL anterior chamber intraocular lens; IOL intraocular lens; PCIOL posterior chamber intraocular lens; Phaco/IOL phacoemulsification with intraocular lens placement; PRK photorefractive keratectomy; LASIK laser assisted in situ keratomileusis; HTN hypertension; DM diabetes mellitus; COPD chronic obstructive  pulmonary disease

## 2023-07-20 ENCOUNTER — Other Ambulatory Visit (HOSPITAL_COMMUNITY): Payer: Self-pay

## 2023-07-20 MED ORDER — WEGOVY 1 MG/0.5ML ~~LOC~~ SOAJ
SUBCUTANEOUS | 0 refills | Status: DC
  Filled 2023-07-20: qty 2, 28d supply, fill #0

## 2023-07-21 ENCOUNTER — Other Ambulatory Visit (HOSPITAL_COMMUNITY): Payer: Self-pay

## 2023-07-22 ENCOUNTER — Encounter (INDEPENDENT_AMBULATORY_CARE_PROVIDER_SITE_OTHER): Payer: Self-pay | Admitting: Ophthalmology

## 2023-07-22 ENCOUNTER — Ambulatory Visit (INDEPENDENT_AMBULATORY_CARE_PROVIDER_SITE_OTHER): Payer: Medicare HMO | Admitting: Ophthalmology

## 2023-07-22 DIAGNOSIS — H33311 Horseshoe tear of retina without detachment, right eye: Secondary | ICD-10-CM

## 2023-07-22 DIAGNOSIS — H3322 Serous retinal detachment, left eye: Secondary | ICD-10-CM

## 2023-07-22 DIAGNOSIS — H35372 Puckering of macula, left eye: Secondary | ICD-10-CM | POA: Diagnosis not present

## 2023-07-22 DIAGNOSIS — H35352 Cystoid macular degeneration, left eye: Secondary | ICD-10-CM

## 2023-07-22 DIAGNOSIS — I10 Essential (primary) hypertension: Secondary | ICD-10-CM | POA: Diagnosis not present

## 2023-07-22 DIAGNOSIS — H35033 Hypertensive retinopathy, bilateral: Secondary | ICD-10-CM

## 2023-07-22 DIAGNOSIS — Z961 Presence of intraocular lens: Secondary | ICD-10-CM

## 2023-07-22 DIAGNOSIS — H40052 Ocular hypertension, left eye: Secondary | ICD-10-CM

## 2023-07-22 MED ORDER — DORZOLAMIDE HCL-TIMOLOL MAL 2-0.5 % OP SOLN: 1.0000 [drp] | Freq: Three times a day (TID) | OPHTHALMIC | 5 refills | Status: DC

## 2023-07-22 MED ORDER — FARICIMAB-SVOA 6 MG/0.05ML IZ SOSY
6.0000 mg | PREFILLED_SYRINGE | INTRAVITREAL | Status: AC | PRN
  Administered 2023-07-22: 6 mg via INTRAVITREAL

## 2023-07-22 MED ORDER — BRIMONIDINE TARTRATE 0.2 % OP SOLN: OPHTHALMIC | 5 refills | Status: DC

## 2023-08-01 DIAGNOSIS — I10 Essential (primary) hypertension: Secondary | ICD-10-CM | POA: Diagnosis not present

## 2023-08-01 DIAGNOSIS — E1136 Type 2 diabetes mellitus with diabetic cataract: Secondary | ICD-10-CM | POA: Diagnosis not present

## 2023-08-01 DIAGNOSIS — E1151 Type 2 diabetes mellitus with diabetic peripheral angiopathy without gangrene: Secondary | ICD-10-CM | POA: Diagnosis not present

## 2023-08-01 DIAGNOSIS — E785 Hyperlipidemia, unspecified: Secondary | ICD-10-CM | POA: Diagnosis not present

## 2023-08-01 DIAGNOSIS — D84821 Immunodeficiency due to drugs: Secondary | ICD-10-CM | POA: Diagnosis not present

## 2023-08-01 DIAGNOSIS — Z8249 Family history of ischemic heart disease and other diseases of the circulatory system: Secondary | ICD-10-CM | POA: Diagnosis not present

## 2023-08-01 DIAGNOSIS — Z87891 Personal history of nicotine dependence: Secondary | ICD-10-CM | POA: Diagnosis not present

## 2023-08-01 DIAGNOSIS — H353290 Exudative age-related macular degeneration, unspecified eye, stage unspecified: Secondary | ICD-10-CM | POA: Diagnosis not present

## 2023-08-01 DIAGNOSIS — Z833 Family history of diabetes mellitus: Secondary | ICD-10-CM | POA: Diagnosis not present

## 2023-08-01 DIAGNOSIS — K219 Gastro-esophageal reflux disease without esophagitis: Secondary | ICD-10-CM | POA: Diagnosis not present

## 2023-08-01 DIAGNOSIS — Z809 Family history of malignant neoplasm, unspecified: Secondary | ICD-10-CM | POA: Diagnosis not present

## 2023-08-10 ENCOUNTER — Other Ambulatory Visit (HOSPITAL_COMMUNITY): Payer: Self-pay

## 2023-08-10 MED ORDER — WEGOVY 1 MG/0.5ML ~~LOC~~ SOAJ
1.0000 mg | SUBCUTANEOUS | 0 refills | Status: AC
Start: 2023-08-10 — End: ?
  Filled 2023-08-10: qty 2, 28d supply, fill #0

## 2023-08-11 ENCOUNTER — Other Ambulatory Visit (HOSPITAL_COMMUNITY): Payer: Self-pay

## 2023-08-17 ENCOUNTER — Other Ambulatory Visit (HOSPITAL_COMMUNITY): Payer: Self-pay

## 2023-08-17 MED ORDER — WEGOVY 1.7 MG/0.75ML ~~LOC~~ SOAJ
SUBCUTANEOUS | 1 refills | Status: AC
Start: 2023-08-17 — End: ?
  Filled 2023-08-17 – 2023-08-23 (×2): qty 3, 28d supply, fill #0
  Filled 2023-10-06 – 2023-10-30 (×2): qty 3, 28d supply, fill #1

## 2023-08-17 NOTE — Progress Notes (Signed)
 [facility] - Clinic Note  08/24/2023    CHIEF COMPLAINT Patient presents for Retina Follow Up   HISTORY OF PRESENT ILLNESS: Alicia Miranda is a 68 y.o. female who presents to the clinic today for:   HPI     Retina Follow Up   Patient presents with  Retinal Break/Detachment.  In left eye.  This started 4 weeks ago.  Duration of 4 weeks.  Since onset it is stable.  I, the attending physician,  performed the HPI with the patient and updated documentation appropriately.        Comments   Retina follow up RD/CME OS IVV OS sample /IVK pt is reporting no vision changes noticed she denies any flashes or floaters she is using Lotemax BID OS Brimonidine BID OS, Cosopt BID OS -       Last edited by Rennis Chris, MD on 08/24/2023  3:55 PM.    Pt states left eye is the same, Cosopt irritates it occasionally  Referring physician: Tally Joe, MD [phone] [address] [place],  [place] [address]  HISTORICAL INFORMATION:   Selected notes from the MEDICAL RECORD NUMBER Referred by Dr. Illene Labrador for possible RD OS LEE: 03/16/02023 Ocular Hx-  PMH- HTN    CURRENT MEDICATIONS: Current Outpatient Medications (Ophthalmic Drugs)  Medication Sig   brimonidine (ALPHAGAN) 0.2 % ophthalmic solution INSTILL 1 DROP INTO LEFT EYE THREE TIMES DAILY   Bromfenac Sodium (PROLENSA) 0.07 % SOLN Place 1 drop into the left eye daily. PT NEEDS 2 BOTTLES DISPENSED AT A TIME-CALL IF COUPON IS NEEDED   Bromfenac Sodium 0.09 % SOLN Place 1 drop into the left eye 4 (four) times daily.   dorzolamide-timolol (COSOPT) 2-0.5 % ophthalmic solution Place 1 drop into the left eye 3 (three) times daily.   erythromycin ophthalmic ointment Place 1 Application into the left eye at bedtime. (Patient not taking: Reported on 05/13/2023)   latanoprost (XALATAN) 0.005 % ophthalmic solution Place 1 drop into the left eye at bedtime.   loteprednol (LOTEMAX) 0.5 % ophthalmic suspension Place 1 drop into  the left eye 2 (two) times daily.   LOTEPREDNOL ETABONATE OP Place 1 drop into the right eye 4 (four) times daily.   prednisoLONE acetate (PRED FORTE) 1 % ophthalmic suspension INSTILL 1 DROP INTO LEFT EYE 4 TIMES DAILY (Patient not taking: Reported on 05/13/2023)   prednisoLONE acetate (PRED FORTE) 1 % ophthalmic suspension Place 1 drop into the left eye 4 (four) times daily. (Patient not taking: Reported on 05/13/2023)   No current facility-administered medications for this visit. (Ophthalmic Drugs)   Current Outpatient Medications (Other)  Medication Sig   APPLE CIDER VINEGAR PO Take 2 tablets by mouth daily. gummies   Cholecalciferol (VITAMIN D3) 10 MCG (400 UNIT) tablet Take 400 Units by mouth daily.   CINNAMON PO Take 1,000 mg by mouth every other day.   COLLAGEN PO Take 3 capsules by mouth daily.   Denosumab (PROLIA ) Inject 60 mg into the skin every 6 (six) months.   FEROSUL 325 (65 Fe) MG tablet Take 325 mg by mouth daily.   fexofenadine (ALLEGRA) 180 MG tablet Take 180 mg by mouth daily.   fluticasone (FLONASE) 50 MCG/ACT nasal spray Place 1 spray into both nostrils daily.   furosemide (LASIX) 40 MG tablet Take 40 mg by mouth 2 (two) times daily.   Glucosamine HCl (GLUCOSAMINE PO) Take 2 tablets by mouth daily.   losartan (COZAAR) 50 MG tablet  Take 100 mg by mouth daily.   Magnesium 400 MG CAPS Take 400 mg by mouth daily.   Melatonin 5 MG CAPS Take 10 mg by mouth at bedtime.   metformin (FORTAMET) 500 MG (OSM) 24 hr tablet Take 500 mg by mouth daily with breakfast.   metformin (FORTAMET) 500 MG (OSM) 24 hr tablet    pantoprazole (PROTONIX) 40 MG tablet Take 40 mg by mouth 2 (two) times daily.   rosuvastatin (CRESTOR) 20 MG tablet Take 20 mg by mouth daily.   Semaglutide-Weight Management (WEGOVY) 1 MG/0.5ML SOAJ Inject 1 mg into the skin once a week.   Semaglutide-Weight Management (WEGOVY) 1 MG/0.5ML SOAJ Inject 1 mg into the skin once a week.   Semaglutide-Weight Management  (WEGOVY) 1.7 MG/0.75ML SOAJ Inject 1.7 mg under the skin once weekly   TURMERIC PO Take 2 tablets by mouth daily.   No current facility-administered medications for this visit. (Other)   REVIEW OF SYSTEMS: ROS   Positive for: Musculoskeletal, Cardiovascular, Eyes, Respiratory Negative for: Constitutional, Gastrointestinal, Neurological, Skin, Genitourinary, HENT, Endocrine, Psychiatric, Allergic/Imm, Heme/Lymph Last edited by Etheleen Mayhew, COT on 08/24/2023  2:22 PM.      ALLERGIES Allergies  Allergen Reactions   Codeine Itching   PAST MEDICAL HISTORY Past Medical History:  Diagnosis Date   Allergy    Arthritis    legs   Asthma    GERD (gastroesophageal reflux disease)    Hyperlipidemia    Hypertension    Past Surgical History:  Procedure Laterality Date   ABDOMINAL HYSTERECTOMY     CHOLECYSTECTOMY     COLONOSCOPY     GAS INSERTION Left 09/05/2021   Procedure: INSERTION OF GAS - C3F8;  Surgeon: Rennis Chris, MD;  Location: [facility] OR;  Service: Ophthalmology;  Laterality: Left;   GAS/FLUID EXCHANGE Left 09/05/2021   Procedure: GAS/FLUID EXCHANGE;  Surgeon: Rennis Chris, MD;  Location: [facility] OR;  Service: Ophthalmology;  Laterality: Left;   PARS PLANA VITRECTOMY Left 09/05/2021   Procedure: PARS PLANA VITRECTOMY WITH 25 GAUGE;  Surgeon: Rennis Chris, MD;  Location: [facility] OR;  Service: Ophthalmology;  Laterality: Left;   PERFLUORONE INJECTION Left 09/05/2021   Procedure: PERFLUORON INJECTION;  Surgeon: Rennis Chris, MD;  Location: [facility] OR;  Service: Ophthalmology;  Laterality: Left;   PHOTOCOAGULATION WITH LASER Left 09/05/2021   Procedure: PHOTOCOAGULATION WITH LASER;  Surgeon: Rennis Chris, MD;  Location: [facility] OR;  Service: Ophthalmology;  Laterality: Left;   SCLERAL BUCKLE Left 09/05/2021   Procedure: SCLERAL BUCKLE;  Surgeon: Rennis Chris, MD;  Location: [facility] OR;  Service: Ophthalmology;  Laterality: Left;   UPPER GASTROINTESTINAL ENDOSCOPY     FAMILY HISTORY Family History   Problem Relation Age of Onset   Retinal detachment Sister    Diabetes Sister    Glaucoma Brother    Diabetes Brother    Colon cancer Brother        dx at 78   Esophageal cancer Neg Hx    Rectal cancer Neg Hx    Stomach cancer Neg Hx    SOCIAL HISTORY Social History   Tobacco Use   Smoking status: Former   Smokeless tobacco: Never   Tobacco comments:    quit smoking in 2002 or 2003  Vaping Use   Vaping status: Never Used  Substance Use Topics   Alcohol use: Yes    Comment: very rarely   Drug use: No       OPHTHALMIC EXAM:  Base Eye Exam     Visual  Acuity (Snellen - Linear)       Right Left   Dist [place] 20/25 20/350   Dist ph [place] NI NI         Tonometry (Tonopen, 2:28 PM)       Right Left   Pressure 12 20         Pupils       Pupils Dark Light Shape React APD   Right PERRL 3 2 Round Brisk None   Left PERRL 3 2 Round Brisk None         Extraocular Movement       Right Left    Full, Ortho Full, Ortho         Neuro/Psych     Oriented x3: Yes   Mood/Affect: Normal         Dilation     Both eyes: 2.5% Phenylephrine @ 2:28 PM           Slit Lamp and Fundus Exam     External Exam       Right Left   External  Periorbital edema         Slit Lamp Exam       Right Left   Lids/Lashes Dermatochalasis - upper lid Dermatochalasis - upper lid, periorbital erythema   Conjunctiva/Sclera white and quiet White and quiet, STK ST quad   Cornea trace PEE, mild tear film debris, well healed cataract wound trace PEE, mild tear film debris, well healed cataract wound, inferior paracentral corneal haze   Anterior Chamber deep and clear deep and clear   Iris round and dilated round and dilated   Lens PC IOL in good position PC IOL in good position   Anterior Vitreous syneresis, asteroid hyalosis, PVD post vitrectomy, clear -- no residual kenalog         Fundus Exam       Right Left   Disc Pink and Sharp mild Pallor, Sharp rim, Compact    C/D Ratio 0.4 0.3   Macula flat, good foveal reflex, mild RPE mottling, No heme or edema Flat, Blunted foveal reflex, RPE mottling, ERM with striae greatest nasal mac, diffuse edema/cystic changes centrally and nasally -- slightly increased, No heme   Vessels attenuated, Tortuous attenuated, Tortuous   Periphery Attached, operculated hole @ 1030 with mild surrounding pigment -- good laser surrounding, no SRF, no new RT/RD Attached, Trace pockets of SRF superior to disc with fibrosis, focal fibrosis at 0600 posterior to buckle, Retina attached over buckle, good buckle height, good laser over buckle and around tears, PRE-OP: small tear with fibrosis @ 0200, bullous, temporal RD from 1230 to 0430           IMAGING AND PROCEDURES  Imaging and Procedures for 08/24/2023  OCT, Retina - OU - Both Eyes       Right Eye Quality was good. Central Foveal Thickness: 277. Progression has been stable. Findings include normal foveal contour, no IRF, no SRF.   Left Eye Quality was good. Central Foveal Thickness: 819. Progression has worsened. Findings include abnormal foveal contour, epiretinal membrane, intraretinal fluid, macular pucker, subretinal fluid (ERM with blunted foveal contour and mild pucker, persistent diffuse edema w/ +IRF/SRF -- slightly increased).   Notes *Images captured and stored on drive  Diagnosis / Impression:  OD: NFP; no IRF/SRF OS: ERM with blunted foveal contour and mild pucker, persistent diffuse edema w/ +IRF/SRF -- slightly increased  Clinical management:  See below  Abbreviations: NFP - Normal foveal profile. CME -  cystoid macular edema. PED - pigment epithelial detachment. IRF - intraretinal fluid. SRF - subretinal fluid. EZ - ellipsoid zone. ERM - epiretinal membrane. ORA - outer retinal atrophy. ORT - outer retinal tubulation. SRHM - subretinal hyper-reflective material. IRHM - intraretinal hyper-reflective material      Intravitreal Injection, Pharmacologic Agent  - OS - Left Eye       Time Out 08/24/2023. 3:19 PM. Confirmed correct patient, procedure, site, and patient consented.   Anesthesia Topical anesthesia was used. Anesthetic medications included Lidocaine 2%, Proparacaine 0.5%.   Procedure Preparation included 5% betadine to ocular surface, eyelid speculum. A (32g) needle was used.   Injection: 6 mg faricimab-svoa 6 MG/0.05ML (Patient supplied)   Route: Intravitreal, Site: Left Eye   NDC: 57846-962-95, Lot: [id number], Expiration date: 02/12/2025, Waste: 0 mL   Post-op Post injection exam found visual acuity of at least counting fingers. The patient tolerated the procedure well. There were no complications. The patient received written and verbal post procedure care education.   Notes **SAMPLE MEDICATION ADMINISTERED**           ASSESSMENT/PLAN:    ICD-10-CM   1. Left retinal detachment  H33.22     2. Cystoid macular edema of left eye  H35.352 OCT, Retina - OU - Both Eyes    Intravitreal Injection, Pharmacologic Agent - OS - Left Eye    faricimab-svoa (VABYSMO) 6mg /0.40mL intravitreal injection    3. Epiretinal membrane (ERM) of left eye  H35.372     4. Retinal tear of right eye  H33.311     5. Essential hypertension  I10     6. Hypertensive retinopathy of both eyes  H35.033     7. 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 Rhegmatogenous retinal detachment with retinal hole, left eye - bullous, temporal, mac off detachment, onset of foveal involvement Tuesday, 03.14.23 by history - detached from 1230 to 430 oclock, fovea off, small tear at 0200 - s/p SBP + PPV/PFO/EL/FAX/14% C3F8 OS, 03.23.2023 - retina attached and in good position -- good buckle height and laser around breaks  - FA 12.18.23 shows OD Focal staining ST periphery corresponding to laser retinopexy, OS Mild Hyperfluorescence of disc, mild perifoveal staining SN to fovea--?petaloid -- suggestive of CME -  BCVA OS 20/250 from 20/200 - OCT shows OS: ERM with blunted foveal contour and mild pucker, persistent cystic changes/diffuse edema -- slightly increased, central SRF  - s/p STK #1 (11.20.23) for CME, #2 (02.28.24), #3 (07.03.24) - s/p IVA OS #1 (05.01.24), #2 (05.29.24), #3 (06.26.24) -- IVA resistance  - s/p IVE OS #1 (07.24.24), #2 (08.21.24), #3 (09.18.24), #4 (10.16.24)  - s/p IVK OS #1 (11.13.24), #2 (12.11.24), #3 (01.08.25)  - s/p IVV OS #1 (02.05.25 -- sample) - IOP OS 20 today -- h/o steroid response - continue holding Prolensa (h/o medicamentosa)  - continue holding PF (h/o steroid response) -- cont Lotemax BID OS  - continue Brimonidine BID OS, Cosopt BID OS -- both to TID OS - discontinue Latanoprost due to possible contribution to CME - recommend IVV OS #2 [sample] today, 03.10.25  - IVK informed consent obtained and signed, 11.13.24 - IVE informed consent obtained and signed, 07.24.24 (OS) - IVV informed consent obtained and signed, 02.05.25 - see procedure note -- AC tap performed - Eylea authorized for 2025 -- [employer] funding unavailable at this time - f/u 4 weeks - DFE,  OCT, possible injection   3. Epiretinal membrane, left eye  - ERM with persistent cystic edema - OCT shows persistent ERM w/ cystic changes slightly increased - monitor for now, but discussed possibility of repeat PPV w/ membrane peel to remove ERM if vision fails to improve  4. Retinal hole OD  - operculated retinal hole located at 1030 with mild surrounding pigment, no SRF - s/p laser retinopexy OD (03.16.23) -- good laser surrounding - continue to monitor  5,6. Hypertensive retinopathy OU - discussed importance of tight BP control - continue to monitor  7. Pseudophakia OU  - s/p CE/IOL OS (Dr. Zenaida Niece, 10.04.23), CE with IOL OD (11.01.23, Dr. Zenaida Niece)  - IOL in good position, doing well  - continue to monitor  8. Ocular hypertension OS  - likely steroid response  - tmax 33 on 01.29.24  - s/p  SLT OS w/ Dr. Wynelle Link on 12.05.23  - IOP today: 20  - continue Brimonidine TID OS, Cosopt TID OS - discontinue Latanoprost due to possible contribution to CME  Ophthalmic Meds Ordered this visit:  Meds ordered this encounter  Medications   faricimab-svoa (VABYSMO) 6mg /0.10mL intravitreal injection     Return in about 4 weeks (around 09/21/2023) for f/u RD OS, DFE, OCT, Possible Injxn.  There are no Patient Instructions on file for this visit.  Explained the diagnoses, plan, and follow up with the patient and they expressed understanding.  Patient expressed understanding of the importance of proper follow up care.   This document serves as a record of services personally performed by Karie Chimera, MD, PhD. It was created on their behalf by Glee Arvin. Manson Passey, OA an ophthalmic technician. The creation of this record is the provider's dictation and/or activities during the visit.    Electronically signed by: Glee Arvin. Manson Passey, OA 08/24/23 3:58 PM  Karie Chimera, M.D., Ph.D. Diseases & Surgery of the Retina and Vitreous [facility]  I have reviewed the above documentation for accuracy and completeness, and I agree with the above. Karie Chimera, M.D., Ph.D. 08/24/23 3:59 PM   Abbreviations: M myopia (nearsighted); A astigmatism; H hyperopia (farsighted); P presbyopia; Mrx spectacle prescription;  CTL contact lenses; OD right eye; OS left eye; OU both eyes  XT exotropia; ET esotropia; PEK punctate epithelial keratitis; PEE punctate epithelial erosions; DES dry eye syndrome; MGD meibomian gland dysfunction; ATs artificial tears; PFAT's preservative free artificial tears; NSC nuclear sclerotic cataract; PSC posterior subcapsular cataract; ERM epi-retinal membrane; PVD posterior vitreous detachment; RD retinal detachment; DM diabetes mellitus; DR diabetic retinopathy; NPDR non-proliferative diabetic retinopathy; PDR proliferative diabetic retinopathy; CSME clinically significant  macular edema; DME diabetic macular edema; dbh dot blot hemorrhages; CWS cotton wool spot; POAG primary open angle glaucoma; C/D cup-to-disc ratio; HVF humphrey visual field; GVF goldmann visual field; OCT optical coherence tomography; IOP intraocular pressure; BRVO Branch retinal vein occlusion; CRVO central retinal vein occlusion; CRAO central retinal artery occlusion; BRAO branch retinal artery occlusion; RT retinal tear; SB scleral buckle; PPV pars plana vitrectomy; VH Vitreous hemorrhage; PRP panretinal laser photocoagulation; IVK intravitreal kenalog; VMT vitreomacular traction; MH Macular hole;  NVD neovascularization of the disc; NVE neovascularization elsewhere; AREDS age related eye disease study; ARMD age related macular degeneration; POAG primary open angle glaucoma; EBMD epithelial/anterior basement membrane dystrophy; ACIOL anterior chamber intraocular lens; IOL intraocular lens; PCIOL posterior chamber intraocular lens; Phaco/IOL phacoemulsification with intraocular lens placement; PRK photorefractive keratectomy; LASIK laser assisted in situ keratomileusis; HTN hypertension; DM diabetes mellitus;  COPD chronic obstructive pulmonary disease

## 2023-08-18 ENCOUNTER — Other Ambulatory Visit (HOSPITAL_COMMUNITY): Payer: Self-pay

## 2023-08-24 ENCOUNTER — Other Ambulatory Visit (HOSPITAL_COMMUNITY): Payer: Self-pay

## 2023-08-24 ENCOUNTER — Encounter (INDEPENDENT_AMBULATORY_CARE_PROVIDER_SITE_OTHER): Payer: Self-pay | Admitting: Ophthalmology

## 2023-08-24 ENCOUNTER — Ambulatory Visit (INDEPENDENT_AMBULATORY_CARE_PROVIDER_SITE_OTHER): Payer: Medicare HMO | Admitting: Ophthalmology

## 2023-08-24 DIAGNOSIS — H35372 Puckering of macula, left eye: Secondary | ICD-10-CM | POA: Diagnosis not present

## 2023-08-24 DIAGNOSIS — H35352 Cystoid macular degeneration, left eye: Secondary | ICD-10-CM

## 2023-08-24 DIAGNOSIS — Z961 Presence of intraocular lens: Secondary | ICD-10-CM

## 2023-08-24 DIAGNOSIS — H3322 Serous retinal detachment, left eye: Secondary | ICD-10-CM

## 2023-08-24 DIAGNOSIS — H33311 Horseshoe tear of retina without detachment, right eye: Secondary | ICD-10-CM | POA: Diagnosis not present

## 2023-08-24 DIAGNOSIS — H35033 Hypertensive retinopathy, bilateral: Secondary | ICD-10-CM

## 2023-08-24 DIAGNOSIS — I10 Essential (primary) hypertension: Secondary | ICD-10-CM

## 2023-08-24 DIAGNOSIS — H40052 Ocular hypertension, left eye: Secondary | ICD-10-CM

## 2023-08-24 MED ORDER — FARICIMAB-SVOA 6 MG/0.05ML IZ SOLN
6.0000 mg | INTRAVITREAL | Status: AC | PRN
  Administered 2023-08-24: 6 mg via INTRAVITREAL

## 2023-08-25 ENCOUNTER — Other Ambulatory Visit (HOSPITAL_COMMUNITY): Payer: Self-pay

## 2023-09-08 ENCOUNTER — Other Ambulatory Visit (INDEPENDENT_AMBULATORY_CARE_PROVIDER_SITE_OTHER): Payer: Self-pay

## 2023-09-08 ENCOUNTER — Other Ambulatory Visit (HOSPITAL_COMMUNITY): Payer: Self-pay

## 2023-09-08 MED ORDER — DORZOLAMIDE HCL-TIMOLOL MAL 2-0.5 % OP SOLN
1.0000 [drp] | Freq: Three times a day (TID) | OPHTHALMIC | 5 refills | Status: DC
  Filled 2023-09-08 (×2): qty 10, 67d supply, fill #0
  Filled 2023-10-07: qty 10, 67d supply, fill #1

## 2023-09-10 ENCOUNTER — Other Ambulatory Visit (HOSPITAL_COMMUNITY): Payer: Self-pay | Admitting: Family Medicine

## 2023-09-10 DIAGNOSIS — Z1231 Encounter for screening mammogram for malignant neoplasm of breast: Secondary | ICD-10-CM

## 2023-09-16 ENCOUNTER — Other Ambulatory Visit (HOSPITAL_COMMUNITY): Payer: Self-pay

## 2023-09-16 MED ORDER — WEGOVY 1.7 MG/0.75ML ~~LOC~~ SOAJ
1.7000 mg | SUBCUTANEOUS | 1 refills | Status: AC
  Filled 2023-09-16: qty 3, 28d supply, fill #0
  Filled 2023-10-06: qty 3, 28d supply, fill #1

## 2023-09-18 NOTE — Progress Notes (Signed)
 [facility] - Clinic Note  09/21/2023    CHIEF COMPLAINT Patient presents for Retina Follow Up   HISTORY OF PRESENT ILLNESS: Alicia Miranda is a 68 y.o. female who presents to the clinic today for:   HPI     Retina Follow Up   Patient presents with  Other.  In both eyes.  This started 4 weeks ago.  I, the attending physician,  performed the HPI with the patient and updated documentation appropriately.        Comments   Patient here for 4 weeks retina follow up for RD OD/CME OS. Patient states vision about the same. Comes/goes. No eye pain.      Last edited by Rennis Chris, MD on 09/21/2023  5:53 PM.     Pt states   Referring physician: Tally Joe, MD [address] [place],  [place] [address]  HISTORICAL INFORMATION:   Selected notes from the MEDICAL RECORD NUMBER Referred by Dr. Illene Labrador for possible RD OS LEE: 03/16/02023 Ocular Hx-  PMH- HTN    CURRENT MEDICATIONS: Current Outpatient Medications (Ophthalmic Drugs)  Medication Sig   brimonidine (ALPHAGAN) 0.2 % ophthalmic solution INSTILL 1 DROP INTO LEFT EYE THREE TIMES DAILY   dorzolamide-timolol (COSOPT) 2-0.5 % ophthalmic solution Place 1 drop into the left eye 3 (three) times daily.   loteprednol (LOTEMAX) 0.5 % ophthalmic suspension Place 1 drop into the left eye 2 (two) times daily.   Bromfenac Sodium (PROLENSA) 0.07 % SOLN Place 1 drop into the left eye daily. PT NEEDS 2 BOTTLES DISPENSED AT A TIME-CALL IF COUPON IS NEEDED   Bromfenac Sodium 0.09 % SOLN Place 1 drop into the left eye 4 (four) times daily.   erythromycin ophthalmic ointment Place 1 Application into the left eye at bedtime. (Patient not taking: Reported on 05/13/2023)   latanoprost (XALATAN) 0.005 % ophthalmic solution Place 1 drop into the left eye at bedtime.   LOTEPREDNOL ETABONATE OP Place 1 drop into the right eye 4 (four) times daily.   prednisoLONE acetate (PRED FORTE) 1 % ophthalmic suspension INSTILL  1 DROP INTO LEFT EYE 4 TIMES DAILY (Patient not taking: Reported on 05/13/2023)   prednisoLONE acetate (PRED FORTE) 1 % ophthalmic suspension Place 1 drop into the left eye 4 (four) times daily. (Patient not taking: Reported on 05/13/2023)   No current facility-administered medications for this visit. (Ophthalmic Drugs)   Current Outpatient Medications (Other)  Medication Sig   APPLE CIDER VINEGAR PO Take 2 tablets by mouth daily. gummies   Cholecalciferol (VITAMIN D3) 10 MCG (400 UNIT) tablet Take 400 Units by mouth daily.   COLLAGEN PO Take 3 capsules by mouth daily.   Denosumab (PROLIA [place]) Inject 60 mg into the skin every 6 (six) months.   fluticasone (FLONASE) 50 MCG/ACT nasal spray Place 1 spray into both nostrils daily.   furosemide (LASIX) 40 MG tablet Take 40 mg by mouth 2 (two) times daily.   Glucosamine HCl (GLUCOSAMINE PO) Take 2 tablets by mouth daily.   losartan (COZAAR) 50 MG tablet Take 100 mg by mouth daily.   Magnesium 400 MG CAPS Take 400 mg by mouth daily.   Melatonin 5 MG CAPS Take 10 mg by mouth at bedtime.   metformin (FORTAMET) 500 MG (OSM) 24 hr tablet Take 500 mg by mouth daily with breakfast.   pantoprazole (PROTONIX) 40 MG tablet Take 40 mg by mouth 2 (two) times daily.   rosuvastatin (CRESTOR) 20 MG tablet  Take 20 mg by mouth daily.   Semaglutide-Weight Management (WEGOVY) 1.7 MG/0.75ML SOAJ Inject 1.7 mg under the skin once weekly   Semaglutide-Weight Management (WEGOVY) 1.7 MG/0.75ML SOAJ Inject 1.7 mg into the skin once a week.   TURMERIC PO Take 2 tablets by mouth daily.   CINNAMON PO Take 1,000 mg by mouth every other day.   FEROSUL 325 (65 Fe) MG tablet Take 325 mg by mouth daily.   fexofenadine (ALLEGRA) 180 MG tablet Take 180 mg by mouth daily.   metformin (FORTAMET) 500 MG (OSM) 24 hr tablet    Semaglutide-Weight Management (WEGOVY) 1 MG/0.5ML SOAJ Inject 1 mg into the skin once a week.   Semaglutide-Weight Management (WEGOVY) 1 MG/0.5ML SOAJ Inject 1  mg into the skin once a week.   No current facility-administered medications for this visit. (Other)   REVIEW OF SYSTEMS: ROS   Positive for: Musculoskeletal, Cardiovascular, Eyes, Respiratory Negative for: Constitutional, Gastrointestinal, Neurological, Skin, Genitourinary, HENT, Endocrine, Psychiatric, Allergic/Imm, Heme/Lymph Last edited by Laddie Aquas, COA on 09/21/2023  2:27 PM.     ALLERGIES Allergies  Allergen Reactions   Codeine Itching   PAST MEDICAL HISTORY Past Medical History:  Diagnosis Date   Allergy    Arthritis    legs   Asthma    GERD (gastroesophageal reflux disease)    Hyperlipidemia    Hypertension    Past Surgical History:  Procedure Laterality Date   ABDOMINAL HYSTERECTOMY     CHOLECYSTECTOMY     COLONOSCOPY     GAS INSERTION Left 09/05/2021   Procedure: INSERTION OF GAS - C3F8;  Surgeon: Rennis Chris, MD;  Location: [facility] OR;  Service: Ophthalmology;  Laterality: Left;   GAS/FLUID EXCHANGE Left 09/05/2021   Procedure: GAS/FLUID EXCHANGE;  Surgeon: Rennis Chris, MD;  Location: [facility] OR;  Service: Ophthalmology;  Laterality: Left;   PARS PLANA VITRECTOMY Left 09/05/2021   Procedure: PARS PLANA VITRECTOMY WITH 25 GAUGE;  Surgeon: Rennis Chris, MD;  Location: [facility] OR;  Service: Ophthalmology;  Laterality: Left;   PERFLUORONE INJECTION Left 09/05/2021   Procedure: PERFLUORON INJECTION;  Surgeon: Rennis Chris, MD;  Location: [facility] OR;  Service: Ophthalmology;  Laterality: Left;   PHOTOCOAGULATION WITH LASER Left 09/05/2021   Procedure: PHOTOCOAGULATION WITH LASER;  Surgeon: Rennis Chris, MD;  Location: [facility] OR;  Service: Ophthalmology;  Laterality: Left;   SCLERAL BUCKLE Left 09/05/2021   Procedure: SCLERAL BUCKLE;  Surgeon: Rennis Chris, MD;  Location: [facility] OR;  Service: Ophthalmology;  Laterality: Left;   UPPER GASTROINTESTINAL ENDOSCOPY     FAMILY HISTORY Family History  Problem Relation Age of Onset   Retinal detachment Sister    Diabetes Sister     Glaucoma Brother    Diabetes Brother    Colon cancer Brother        dx at 40   Esophageal cancer Neg Hx    Rectal cancer Neg Hx    Stomach cancer Neg Hx    SOCIAL HISTORY Social History   Tobacco Use   Smoking status: Former   Smokeless tobacco: Never   Tobacco comments:    quit smoking in 2002 or 2003  Vaping Use   Vaping status: Never Used  Substance Use Topics   Alcohol use: Yes    Comment: very rarely   Drug use: No       OPHTHALMIC EXAM:  Base Eye Exam     Visual Acuity (Snellen - Linear)       Right Left   Dist [place] 20/20  20/250   Dist ph [place]  NI         Tonometry (Tonopen, 2:25 PM)       Right Left   Pressure 13 15         Pupils       Dark Light Shape React APD   Right 3 2 Round Brisk None   Left 3 2 Round Brisk None         Visual Fields (Counting fingers)       Left Right    Full Full         Extraocular Movement       Right Left    Full, Ortho Full, Ortho         Neuro/Psych     Oriented x3: Yes   Mood/Affect: Normal         Dilation     Both eyes: 1.0% Mydriacyl, 2.5% Phenylephrine @ 2:25 PM           Slit Lamp and Fundus Exam     External Exam       Right Left   External  Periorbital edema         Slit Lamp Exam       Right Left   Lids/Lashes Dermatochalasis - upper lid Dermatochalasis - upper lid, periorbital erythema   Conjunctiva/Sclera white and quiet White and quiet, STK ST quad   Cornea trace PEE, mild tear film debris, well healed cataract wound trace PEE, mild tear film debris, well healed cataract wound, inferior paracentral corneal haze   Anterior Chamber deep and clear deep and clear   Iris round and dilated round and dilated   Lens PC IOL in good position PC IOL in good position   Anterior Vitreous syneresis, asteroid hyalosis, PVD post vitrectomy, clear -- no residual kenalog         Fundus Exam       Right Left   Disc Pink and Sharp mild Pallor, Sharp rim, Compact   C/D Ratio  0.4 0.3   Macula flat, good foveal reflex, mild RPE mottling, No heme or edema Flat, Blunted foveal reflex, RPE mottling, ERM with striae greatest nasal mac, diffuse edema/cystic changes centrally and nasally, SRF slightly increased, No heme   Vessels attenuated, Tortuous attenuated, Tortuous   Periphery Attached, operculated hole @ 1030 with mild surrounding pigment -- good laser surrounding, no SRF, no new RT/RD Attached, Trace pockets of SRF superior to disc with fibrosis, focal fibrosis at 0600 posterior to buckle, Retina attached over buckle, good buckle height, good laser over buckle and around tears, PRE-OP: small tear with fibrosis @ 0200, bullous, temporal RD from 1230 to 0430           IMAGING AND PROCEDURES  Imaging and Procedures for 09/21/2023  OCT, Retina - OU - Both Eyes       Right Eye Quality was good. Central Foveal Thickness: 277. Progression has been stable. Findings include normal foveal contour, no IRF, no SRF.   Left Eye Quality was good. Central Foveal Thickness: 859. Progression has worsened. Findings include abnormal foveal contour, epiretinal membrane, intraretinal fluid, macular pucker, subretinal fluid (ERM with blunted foveal contour and mild pucker, persistent diffuse edema w/ +IRF, SRF -- slightly increased).   Notes *Images captured and stored on drive  Diagnosis / Impression:  OD: NFP; no IRF/SRF OS: ERM with blunted foveal contour and mild pucker, persistent diffuse edema w/ +IRF, SRF -- slightly increased  Clinical management:  See  below  Abbreviations: NFP - Normal foveal profile. CME - cystoid macular edema. PED - pigment epithelial detachment. IRF - intraretinal fluid. SRF - subretinal fluid. EZ - ellipsoid zone. ERM - epiretinal membrane. ORA - outer retinal atrophy. ORT - outer retinal tubulation. SRHM - subretinal hyper-reflective material. IRHM - intraretinal hyper-reflective material      Intravitreal Injection, Pharmacologic Agent - OS -  Left Eye       Time Out 09/21/2023. 3:14 PM. Confirmed correct patient, procedure, site, and patient consented.   Anesthesia Topical anesthesia was used. Anesthetic medications included Lidocaine 2%, Proparacaine 0.5%.   Procedure Preparation included 5% betadine to ocular surface, eyelid speculum. A (32g) needle was used.   Injection: 6 mg faricimab-svoa 6 MG/0.05ML (Patient supplied)   Route: Intravitreal, Site: Left Eye   NDC: 40981-191-47, Lot: [id number], Expiration date: 02/12/2025, Waste: 0 mL   Post-op Post injection exam found visual acuity of at least counting fingers. The patient tolerated the procedure well. There were no complications. The patient received written and verbal post procedure care education.   Notes **SAMPLE MEDICATION ADMINISTERED**           ASSESSMENT/PLAN:    ICD-10-CM   1. Left retinal detachment  H33.22 OCT, Retina - OU - Both Eyes    2. Cystoid macular edema of left eye  H35.352 OCT, Retina - OU - Both Eyes    Intravitreal Injection, Pharmacologic Agent - OS - Left Eye    faricimab-svoa (VABYSMO) 6mg /0.58mL intravitreal injection    3. Epiretinal membrane (ERM) of left eye  H35.372 OCT, Retina - OU - Both Eyes    4. Retinal tear of right eye  H33.311     5. Essential hypertension  I10     6. Hypertensive retinopathy of both eyes  H35.033     7. 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 Rhegmatogenous retinal detachment with retinal hole, left eye - bullous, temporal, mac off detachment, onset of foveal involvement Tuesday, 03.14.23 by history - detached from 1230 to 430 oclock, fovea off, small tear at 0200 - s/p SBP + PPV/PFO/EL/FAX/14% C3F8 OS, 03.23.2023 - retina attached and in good position -- good buckle height and laser around breaks  - FA 12.18.23 shows OD Focal staining ST periphery corresponding to laser retinopexy, OS Mild Hyperfluorescence of disc, mild perifoveal staining  SN to fovea--?petaloid -- suggestive of CME - BCVA OS stable 20/250  - OCT shows ERM with blunted foveal contour and mild pucker, persistent diffuse edema w/ +IRF, SRF -- slightly increased - s/p STK #1 (11.20.23) for CME, #2 (02.28.24), #3 (07.03.24) - s/p IVA OS #1 (05.01.24), #2 (05.29.24), #3 (06.26.24) -- IVA resistance  - s/p IVE OS #1 (07.24.24), #2 (08.21.24), #3 (09.18.24), #4 (10.16.24)  - s/p IVK OS #1 (11.13.24), #2 (12.11.24), #3 (01.08.25)  - s/p IVV OS #1 (02.05.25 -- sample) #2 sample (03.10.25)  **h/o good CME response to IVK, but poor steroid/IOP response to IVK** - IOP OS 15 today -- h/o steroid response - BCVA OS 20/250 from 20/350 - continue holding Prolensa (h/o medicamentosa)  - continue holding PF (h/o steroid response) -- cont Lotemax BID OS  - continue Brimonidine TID OS, Cosopt TID OS - discontinued Latanoprost due to possible contribution to CME - recommend IVV OS #3 [sample] today, 04.07.25  - IVK informed consent obtained and signed, 11.13.24 - IVE informed consent obtained and signed,  07.24.24 (OS) - IVV informed consent obtained and signed, 02.05.25 - see procedure note - Eylea authorized for 2025 -- [employer] funding unavailable at this time - f/u 4 weeks - DFE, OCT, possible injection   3. Epiretinal membrane, left eye  - ERM with persistent cystic edema - OCT shows persistent ERM w/ cystic changes slightly increased - monitor for now, but discussed possibility of repeat PPV w/ membrane peel to remove ERM if vision fails to improve  4. Retinal hole OD  - operculated retinal hole located at 1030 with mild surrounding pigment, no SRF - s/p laser retinopexy OD (03.16.23) -- good laser surrounding - continue to monitor  5,6. Hypertensive retinopathy OU - discussed importance of tight BP control - continue to monitor  7. Pseudophakia OU  - s/p CE/IOL OS (Dr. Zenaida Niece, 10.04.23), CE with IOL OD (11.01.23, Dr. Zenaida Niece)  - IOL in good position, doing well  -  continue to monitor  8. Ocular hypertension OS  - h/o steroid response  - tmax 33 on 01.29.24  - s/p SLT OS w/ Dr. Wynelle Link on 12.05.23  - IOP today: 15  - continue Brimonidine TID OS, Cosopt TID OS - discontinued Latanoprost due to possible contribution to CME  Ophthalmic Meds Ordered this visit:  Meds ordered this encounter  Medications   faricimab-svoa (VABYSMO) 6mg /0.86mL intravitreal injection     Return in about 4 weeks (around 10/19/2023) for f/u CME OS, DFE, OCT.  There are no Patient Instructions on file for this visit.  This document serves as a record of services personally performed by Karie Chimera, MD, PhD. It was created on their behalf by Berlin Hun COT, an ophthalmic technician. The creation of this record is the provider's dictation and/or activities during the visit.    Electronically signed by: Berlin Hun COT 04.04.255:58 PM  This document serves as a record of services personally performed by Karie Chimera, MD, PhD. It was created on their behalf by Glee Arvin. Manson Passey, OA an ophthalmic technician. The creation of this record is the provider's dictation and/or activities during the visit.    Electronically signed by: Glee Arvin. Manson Passey, OA 09/21/23 5:58 PM  Karie Chimera, M.D., Ph.D. Diseases & Surgery of the Retina and Vitreous [facility] 09/21/2023   I have reviewed the above documentation for accuracy and completeness, and I agree with the above. Karie Chimera, M.D., Ph.D. 09/21/23 6:08 PM   Abbreviations: M myopia (nearsighted); A astigmatism; H hyperopia (farsighted); P presbyopia; Mrx spectacle prescription;  CTL contact lenses; OD right eye; OS left eye; OU both eyes  XT exotropia; ET esotropia; PEK punctate epithelial keratitis; PEE punctate epithelial erosions; DES dry eye syndrome; MGD meibomian gland dysfunction; ATs artificial tears; PFAT's preservative free artificial tears; NSC nuclear sclerotic cataract; PSC  posterior subcapsular cataract; ERM epi-retinal membrane; PVD posterior vitreous detachment; RD retinal detachment; DM diabetes mellitus; DR diabetic retinopathy; NPDR non-proliferative diabetic retinopathy; PDR proliferative diabetic retinopathy; CSME clinically significant macular edema; DME diabetic macular edema; dbh dot blot hemorrhages; CWS cotton wool spot; POAG primary open angle glaucoma; C/D cup-to-disc ratio; HVF humphrey visual field; GVF goldmann visual field; OCT optical coherence tomography; IOP intraocular pressure; BRVO Branch retinal vein occlusion; CRVO central retinal vein occlusion; CRAO central retinal artery occlusion; BRAO branch retinal artery occlusion; RT retinal tear; SB scleral buckle; PPV pars plana vitrectomy; VH Vitreous hemorrhage; PRP panretinal laser photocoagulation; IVK intravitreal kenalog; VMT vitreomacular traction; MH Macular hole;  NVD neovascularization  of the disc; NVE neovascularization elsewhere; AREDS age related eye disease study; ARMD age related macular degeneration; POAG primary open angle glaucoma; EBMD epithelial/anterior basement membrane dystrophy; ACIOL anterior chamber intraocular lens; IOL intraocular lens; PCIOL posterior chamber intraocular lens; Phaco/IOL phacoemulsification with intraocular lens placement; PRK photorefractive keratectomy; LASIK laser assisted in situ keratomileusis; HTN hypertension; DM diabetes mellitus; COPD chronic obstructive pulmonary disease

## 2023-09-21 ENCOUNTER — Ambulatory Visit (INDEPENDENT_AMBULATORY_CARE_PROVIDER_SITE_OTHER): Admitting: Ophthalmology

## 2023-09-21 ENCOUNTER — Encounter (INDEPENDENT_AMBULATORY_CARE_PROVIDER_SITE_OTHER): Payer: Self-pay | Admitting: Ophthalmology

## 2023-09-21 DIAGNOSIS — H40052 Ocular hypertension, left eye: Secondary | ICD-10-CM

## 2023-09-21 DIAGNOSIS — H33311 Horseshoe tear of retina without detachment, right eye: Secondary | ICD-10-CM

## 2023-09-21 DIAGNOSIS — H35033 Hypertensive retinopathy, bilateral: Secondary | ICD-10-CM

## 2023-09-21 DIAGNOSIS — I10 Essential (primary) hypertension: Secondary | ICD-10-CM | POA: Diagnosis not present

## 2023-09-21 DIAGNOSIS — H3322 Serous retinal detachment, left eye: Secondary | ICD-10-CM | POA: Diagnosis not present

## 2023-09-21 DIAGNOSIS — H35352 Cystoid macular degeneration, left eye: Secondary | ICD-10-CM

## 2023-09-21 DIAGNOSIS — Z961 Presence of intraocular lens: Secondary | ICD-10-CM

## 2023-09-21 DIAGNOSIS — H35372 Puckering of macula, left eye: Secondary | ICD-10-CM

## 2023-09-21 MED ORDER — FARICIMAB-SVOA 6 MG/0.05ML IZ SOLN
6.0000 mg | INTRAVITREAL | Status: AC | PRN
  Administered 2023-09-21: 6 mg via INTRAVITREAL

## 2023-09-28 ENCOUNTER — Ambulatory Visit (HOSPITAL_COMMUNITY)

## 2023-10-05 ENCOUNTER — Encounter (HOSPITAL_COMMUNITY): Payer: Self-pay

## 2023-10-05 ENCOUNTER — Ambulatory Visit (HOSPITAL_COMMUNITY)
Admission: RE | Admit: 2023-10-05 | Discharge: 2023-10-05 | Disposition: A | Discharge status: Home / Self Care | Source: Ambulatory Visit | Attending: Family Medicine | Admitting: Family Medicine

## 2023-10-05 DIAGNOSIS — Z1231 Encounter for screening mammogram for malignant neoplasm of breast: Secondary | ICD-10-CM | POA: Diagnosis not present

## 2023-10-06 NOTE — Progress Notes (Signed)
 [facility] - Clinic Note  10/20/2023    CHIEF COMPLAINT Patient presents for Retina Follow Up   HISTORY OF PRESENT ILLNESS: Alicia Miranda is a 68 y.o. female who presents to the clinic today for:   HPI     Retina Follow Up   In both eyes.  This started 4 weeks ago.  Duration of 4 weeks.  Since onset it is stable.  I, the attending physician,  performed the HPI with the patient and updated documentation appropriately.        Comments   4 week retina follow up CME OS and IVV OS pt is reporting no vision changes noticed she denies any flashes or floaters       Last edited by Ronelle Coffee, MD on 10/20/2023  2:07 PM.    Pt states   Referring physician: Rae Bugler, MD [phone] [address] [place],  [place] [address]  HISTORICAL INFORMATION:   Selected notes from the MEDICAL RECORD NUMBER Referred by Dr. Donalynn Fry for possible RD OS LEE: 03/16/02023 Ocular Hx-  PMH- HTN    CURRENT MEDICATIONS: Current Outpatient Medications (Ophthalmic Drugs)  Medication Sig   brimonidine  (ALPHAGAN ) 0.2 % ophthalmic solution INSTILL 1 DROP INTO LEFT EYE THREE TIMES DAILY   Bromfenac  Sodium (PROLENSA ) 0.07 % SOLN Place 1 drop into the left eye daily. PT NEEDS 2 BOTTLES DISPENSED AT A TIME-CALL IF COUPON IS NEEDED   Bromfenac  Sodium 0.09 % SOLN Place 1 drop into the left eye 4 (four) times daily.   dorzolamide -timolol  (COSOPT ) 2-0.5 % ophthalmic solution Place 1 drop into the left eye 3 (three) times daily.   erythromycin  ophthalmic ointment Place 1 Application into the left eye at bedtime. (Patient not taking: Reported on 05/13/2023)   latanoprost  (XALATAN ) 0.005 % ophthalmic solution Place 1 drop into the left eye at bedtime.   loteprednol  (LOTEMAX ) 0.5 % ophthalmic suspension Place 1 drop into the left eye 2 (two) times daily.   LOTEPREDNOL  ETABONATE OP Place 1 drop into the right eye 4 (four) times daily.   prednisoLONE  acetate (PRED FORTE ) 1 % ophthalmic  suspension INSTILL 1 DROP INTO LEFT EYE 4 TIMES DAILY (Patient not taking: Reported on 05/13/2023)   prednisoLONE  acetate (PRED FORTE ) 1 % ophthalmic suspension Place 1 drop into the left eye 4 (four) times daily. (Patient not taking: Reported on 05/13/2023)   No current facility-administered medications for this visit. (Ophthalmic Drugs)   Current Outpatient Medications (Other)  Medication Sig   APPLE CIDER VINEGAR PO Take 2 tablets by mouth daily. gummies   Cholecalciferol (VITAMIN D3) 10 MCG (400 UNIT) tablet Take 400 Units by mouth daily.   CINNAMON PO Take 1,000 mg by mouth every other day.   COLLAGEN PO Take 3 capsules by mouth daily.   Denosumab  (PROLIA  [place]) Inject 60 mg into the skin every 6 (six) months.   FEROSUL 325 (65 Fe) MG tablet Take 325 mg by mouth daily.   fexofenadine (ALLEGRA) 180 MG tablet Take 180 mg by mouth daily.   fluticasone (FLONASE) 50 MCG/ACT nasal spray Place 1 spray into both nostrils daily.   furosemide (LASIX) 40 MG tablet Take 40 mg by mouth 2 (two) times daily.   Glucosamine HCl (GLUCOSAMINE PO) Take 2 tablets by mouth daily.   losartan (COZAAR) 50 MG tablet Take 100 mg by mouth daily.   Magnesium 400 MG CAPS Take 400 mg by mouth daily.   Melatonin 5 MG CAPS Take 10 mg  by mouth at bedtime.   metformin (FORTAMET) 500 MG (OSM) 24 hr tablet Take 500 mg by mouth daily with breakfast.   metformin (FORTAMET) 500 MG (OSM) 24 hr tablet    pantoprazole (PROTONIX) 40 MG tablet Take 40 mg by mouth 2 (two) times daily.   rosuvastatin (CRESTOR) 20 MG tablet Take 20 mg by mouth daily.   Semaglutide -Weight Management (WEGOVY ) 1 MG/0.5ML SOAJ Inject 1 mg into the skin once a week.   Semaglutide -Weight Management (WEGOVY ) 1 MG/0.5ML SOAJ Inject 1 mg into the skin once a week.   Semaglutide -Weight Management (WEGOVY ) 1.7 MG/0.75ML SOAJ Inject 1.7 mg under the skin once weekly   Semaglutide -Weight Management (WEGOVY ) 1.7 MG/0.75ML SOAJ Inject 1.7 mg into the skin once a  week.   TURMERIC PO Take 2 tablets by mouth daily.   No current facility-administered medications for this visit. (Other)   REVIEW OF SYSTEMS: ROS   Positive for: Musculoskeletal, Cardiovascular, Eyes, Respiratory Negative for: Constitutional, Gastrointestinal, Neurological, Skin, Genitourinary, HENT, Endocrine, Psychiatric, Allergic/Imm, Heme/Lymph Last edited by Alise Appl, COT on 10/20/2023  1:15 PM.     ALLERGIES Allergies  Allergen Reactions   Codeine Itching   PAST MEDICAL HISTORY Past Medical History:  Diagnosis Date   Allergy    Arthritis    legs   Asthma    GERD (gastroesophageal reflux disease)    Hyperlipidemia    Hypertension    Past Surgical History:  Procedure Laterality Date   ABDOMINAL HYSTERECTOMY     CHOLECYSTECTOMY     COLONOSCOPY     GAS INSERTION Left 09/05/2021   Procedure: INSERTION OF GAS - C3F8;  Surgeon: Ronelle Coffee, MD;  Location: [facility] OR;  Service: Ophthalmology;  Laterality: Left;   GAS/FLUID EXCHANGE Left 09/05/2021   Procedure: GAS/FLUID EXCHANGE;  Surgeon: Ronelle Coffee, MD;  Location: [facility] OR;  Service: Ophthalmology;  Laterality: Left;   PARS PLANA VITRECTOMY Left 09/05/2021   Procedure: PARS PLANA VITRECTOMY WITH 25 GAUGE;  Surgeon: Ronelle Coffee, MD;  Location: [facility] OR;  Service: Ophthalmology;  Laterality: Left;   PERFLUORONE INJECTION Left 09/05/2021   Procedure: PERFLUORON INJECTION;  Surgeon: Ronelle Coffee, MD;  Location: [facility] OR;  Service: Ophthalmology;  Laterality: Left;   PHOTOCOAGULATION WITH LASER Left 09/05/2021   Procedure: PHOTOCOAGULATION WITH LASER;  Surgeon: Ronelle Coffee, MD;  Location: [facility] OR;  Service: Ophthalmology;  Laterality: Left;   SCLERAL BUCKLE Left 09/05/2021   Procedure: SCLERAL BUCKLE;  Surgeon: Ronelle Coffee, MD;  Location: [facility] OR;  Service: Ophthalmology;  Laterality: Left;   UPPER GASTROINTESTINAL ENDOSCOPY     FAMILY HISTORY Family History  Problem Relation Age of Onset   Retinal detachment Sister     Diabetes Sister    Glaucoma Brother    Diabetes Brother    Colon cancer Brother        dx at 57   Esophageal cancer Neg Hx    Rectal cancer Neg Hx    Stomach cancer Neg Hx    SOCIAL HISTORY Social History   Tobacco Use   Smoking status: Former   Smokeless tobacco: Never   Tobacco comments:    quit smoking in 2002 or 2003  Vaping Use   Vaping status: Never Used  Substance Use Topics   Alcohol use: Yes    Comment: very rarely   Drug use: No       OPHTHALMIC EXAM:  Base Eye Exam     Visual Acuity (Snellen - Linear)       Right  Left   Dist [place] 20/25 20/350 -1   Dist ph [place]  NI         Tonometry (Tonopen, 1:19 PM)       Right Left   Pressure 16 12         Pupils       Pupils Dark Light Shape React APD   Right PERRL 3 2 Round Brisk None   Left PERRL 3 2 Round Brisk None         Visual Fields       Left Right    Full Full         Extraocular Movement       Right Left    Full, Ortho Full, Ortho         Neuro/Psych     Oriented x3: Yes   Mood/Affect: Normal         Dilation     Both eyes: 2.5% Phenylephrine  @ 1:19 PM           Slit Lamp and Fundus Exam     External Exam       Right Left   External  Periorbital edema         Slit Lamp Exam       Right Left   Lids/Lashes Dermatochalasis - upper lid Dermatochalasis - upper lid, periorbital erythema   Conjunctiva/Sclera white and quiet White and quiet, STK ST quad   Cornea trace PEE, mild tear film debris, well healed cataract wound trace PEE, mild tear film debris, well healed cataract wound, inferior paracentral corneal haze   Anterior Chamber deep and clear deep and clear   Iris round and dilated round and dilated   Lens PC IOL in good position PC IOL in good position   Anterior Vitreous syneresis, asteroid hyalosis, PVD post vitrectomy, clear -- no residual kenalog          Fundus Exam       Right Left   Disc Pink and Sharp mild Pallor, Sharp rim, Compact   C/D  Ratio 0.4 0.3   Macula flat, good foveal reflex, mild RPE mottling, No heme or edema Flat, Blunted foveal reflex, RPE mottling, ERM with striae greatest nasal mac, diffuse edema/cystic changes centrally and nasally, shallow SRF slightly improved, No heme   Vessels attenuated, Tortuous attenuated, Tortuous   Periphery Attached, operculated hole @ 1030 with mild surrounding pigment -- good laser surrounding, no SRF, no new RT/RD Attached, Trace pockets of SRF superior to disc with fibrosis, focal fibrosis at 0600 posterior to buckle, Retina attached over buckle, good buckle height, good laser over buckle and around tears, PRE-OP: small tear with fibrosis @ 0200, bullous, temporal RD from 1230 to 0430           IMAGING AND PROCEDURES  Imaging and Procedures for 10/20/2023  OCT, Retina - OU - Both Eyes       Right Eye Quality was good. Central Foveal Thickness: 278. Progression has been stable. Findings include normal foveal contour, no IRF, no SRF.   Left Eye Quality was good. Central Foveal Thickness: 757. Progression has improved. Findings include abnormal foveal contour, epiretinal membrane, intraretinal fluid, macular pucker, subretinal fluid (ERM with blunted foveal contour and mild pucker, persistent diffuse edema w/ +IRF, SRF -- slightly improved).   Notes *Images captured and stored on drive  Diagnosis / Impression:  OD: NFP; no IRF/SRF OS: ERM with blunted foveal contour and mild pucker, persistent diffuse edema w/ +IRF, SRF --  slightly improved  Clinical management:  See below  Abbreviations: NFP - Normal foveal profile. CME - cystoid macular edema. PED - pigment epithelial detachment. IRF - intraretinal fluid. SRF - subretinal fluid. EZ - ellipsoid zone. ERM - epiretinal membrane. ORA - outer retinal atrophy. ORT - outer retinal tubulation. SRHM - subretinal hyper-reflective material. IRHM - intraretinal hyper-reflective material      Intravitreal Injection, Pharmacologic  Agent - OS - Left Eye       Time Out 10/20/2023. 1:47 PM. Confirmed correct patient, procedure, site, and patient consented.   Anesthesia Topical anesthesia was used. Anesthetic medications included Lidocaine  2%, Proparacaine  0.5%.   Procedure Preparation included 5% betadine to ocular surface, eyelid speculum. A 27 gauge needle was used.   Injection: 4 mg triamcinolone  acetonide 40 MG/ML   Route: Intravitreal, Site: Left Eye   NDC: 1610-9604-54, Lot: [id number], Expiration date: 12/13/2024, Waste: 0.9 mL   Post-op Post injection exam found visual acuity of at least counting fingers. The patient tolerated the procedure well. There were no complications. The patient received written and verbal post procedure care education.            ASSESSMENT/PLAN:    ICD-10-CM   1. Left retinal detachment  H33.22 OCT, Retina - OU - Both Eyes    2. Cystoid macular edema of left eye  H35.352 Intravitreal Injection, Pharmacologic Agent - OS - Left Eye    triamcinolone  acetonide (KENALOG -40) injection 4 mg    3. Epiretinal membrane (ERM) of left eye  H35.372     4. Retinal tear of right eye  H33.311     5. Essential hypertension  I10     6. Hypertensive retinopathy of both eyes  H35.033     7. 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 Rhegmatogenous retinal detachment with retinal hole, left eye - bullous, temporal, mac off detachment, onset of foveal involvement Tuesday, 03.14.23 by history - detached from 1230 to 430 oclock, fovea off, small tear at 0200 - s/p SBP + PPV/PFO/EL/FAX/14% C3F8 OS, 03.23.2023 - retina attached and in good position -- good buckle height and laser around breaks  - FA 12.18.23 shows OD Focal staining ST periphery corresponding to laser retinopexy, OS Mild Hyperfluorescence of disc, mild perifoveal staining SN to fovea--?petaloid -- suggestive of CME - OCT shows ERM with blunted foveal contour and mild pucker,  persistent diffuse edema w/ +IRF, SRF -- slightly improved - s/p STK #1 (11.20.23) for CME, #2 (02.28.24), #3 (07.03.24) - s/p IVA OS #1 (05.01.24), #2 (05.29.24), #3 (06.26.24) -- IVA resistance  - s/p IVE OS #1 (07.24.24), #2 (08.21.24), #3 (09.18.24), #4 (10.16.24)  - s/p IVK OS #1 (11.13.24), #2 (12.11.24), #3 (01.08.25)  - s/p IVV OS #1 (02.05.25 -- sample) #2 sample (03.10.25), #3 (sample 04.07.25)  **h/o good CME response to IVK, but poor steroid/IOP response to IVK** - IOP OS 12 today -- h/o steroid response - BCVA OS 20/350 from 20/250 - continue holding Prolensa  (h/o medicamentosa)  - continue holding PF (h/o steroid response) -- cont Lotemax  BID OS  - continue Brimonidine  TID OS, Cosopt  TID OS - discontinued Latanoprost  due to possible contribution to CME - recommend IVK OS #4 today, 05.06.25  - IVK informed consent obtained and signed, 11.13.24 - IVE informed consent obtained and signed, 07.24.24 (OS) - IVV informed consent obtained and signed, 02.05.25 - see procedure note - Eylea  authorized  for 2025 -- [employer] funding unavailable at this time - f/u 4 weeks - DFE, OCT, possible injection   3. Epiretinal membrane, left eye  - ERM with persistent cystic edema - OCT shows persistent ERM w/ cystic changes slightly increased - monitor for now, but discussed possibility of repeat PPV w/ membrane peel to remove ERM if vision fails to improve  4. Retinal hole OD  - operculated retinal hole located at 1030 with mild surrounding pigment, no SRF - s/p laser retinopexy OD (03.16.23) -- good laser surrounding - continue to monitor  5,6. Hypertensive retinopathy OU - discussed importance of tight BP control - continue to monitor  7. Pseudophakia OU  - s/p CE/IOL OS (Dr. Carloyn Chi, 10.04.23), CE with IOL OD (11.01.23, Dr. Carloyn Chi)  - IOL in good position, doing well  - continue to monitor  8. Ocular hypertension OS  - h/o steroid response  - tmax 33 on 01.29.24  - s/p SLT OS w/ Dr.  Paulene Boron on 12.05.23  - IOP today: 12  - continue Brimonidine  TID OS, Cosopt  TID OS - discontinued Latanoprost  due to possible contribution to CME  Ophthalmic Meds Ordered this visit:  Meds ordered this encounter  Medications   brimonidine  (ALPHAGAN ) 0.2 % ophthalmic solution    Sig: INSTILL 1 DROP INTO LEFT EYE THREE TIMES DAILY    Dispense:  15 mL    Refill:  10   dorzolamide -timolol  (COSOPT ) 2-0.5 % ophthalmic solution    Sig: Place 1 drop into the left eye 3 (three) times daily.    Dispense:  10 mL    Refill:  10   triamcinolone  acetonide (KENALOG -40) injection 4 mg     Return in about 4 weeks (around 11/17/2023) for f/u CME OS, DFE, OCT, Possible Injxn.  There are no Patient Instructions on file for this visit.  This document serves as a record of services personally performed by Jeanice Millard, MD, PhD. It was created on their behalf by Olene Berne, COT an ophthalmic technician. The creation of this record is the provider's dictation and/or activities during the visit.    Electronically signed by:  Olene Berne, COT  10/20/23 2:09 PM  This document serves as a record of services personally performed by Jeanice Millard, MD, PhD. It was created on their behalf by Morley Arabia. Bevin Bucks, OA an ophthalmic technician. The creation of this record is the provider's dictation and/or activities during the visit.    Electronically signed by: Morley Arabia. Bevin Bucks, OA 10/20/23 2:09 PM  Jeanice Millard, M.D., Ph.D. Diseases & Surgery of the Retina and Vitreous [facility] 10/20/2023   I have reviewed the above documentation for accuracy and completeness, and I agree with the above. Jeanice Millard, M.D., Ph.D. 10/20/23 2:10 PM   Abbreviations: M myopia (nearsighted); A astigmatism; H hyperopia (farsighted); P presbyopia; Mrx spectacle prescription;  CTL contact lenses; OD right eye; OS left eye; OU both eyes  XT exotropia; ET esotropia; PEK punctate epithelial  keratitis; PEE punctate epithelial erosions; DES dry eye syndrome; MGD meibomian gland dysfunction; ATs artificial tears; PFAT's preservative free artificial tears; NSC nuclear sclerotic cataract; PSC posterior subcapsular cataract; ERM epi-retinal membrane; PVD posterior vitreous detachment; RD retinal detachment; DM diabetes mellitus; DR diabetic retinopathy; NPDR non-proliferative diabetic retinopathy; PDR proliferative diabetic retinopathy; CSME clinically significant macular edema; DME diabetic macular edema; dbh dot blot hemorrhages; CWS cotton wool spot; POAG primary open angle glaucoma; C/D cup-to-disc ratio; HVF humphrey visual  field; GVF goldmann visual field; OCT optical coherence tomography; IOP intraocular pressure; BRVO Branch retinal vein occlusion; CRVO central retinal vein occlusion; CRAO central retinal artery occlusion; BRAO branch retinal artery occlusion; RT retinal tear; SB scleral buckle; PPV pars plana vitrectomy; VH Vitreous hemorrhage; PRP panretinal laser photocoagulation; IVK intravitreal kenalog ; VMT vitreomacular traction; MH Macular hole;  NVD neovascularization of the disc; NVE neovascularization elsewhere; AREDS age related eye disease study; ARMD age related macular degeneration; POAG primary open angle glaucoma; EBMD epithelial/anterior basement membrane dystrophy; ACIOL anterior chamber intraocular lens; IOL intraocular lens; PCIOL posterior chamber intraocular lens; Phaco/IOL phacoemulsification with intraocular lens placement; PRK photorefractive keratectomy; LASIK laser assisted in situ keratomileusis; HTN hypertension; DM diabetes mellitus; COPD chronic obstructive pulmonary disease

## 2023-10-07 ENCOUNTER — Other Ambulatory Visit: Payer: Self-pay

## 2023-10-07 ENCOUNTER — Other Ambulatory Visit (HOSPITAL_COMMUNITY): Payer: Self-pay

## 2023-10-08 ENCOUNTER — Other Ambulatory Visit (HOSPITAL_COMMUNITY): Payer: Self-pay

## 2023-10-09 ENCOUNTER — Other Ambulatory Visit (HOSPITAL_COMMUNITY): Payer: Self-pay

## 2023-10-20 ENCOUNTER — Ambulatory Visit (INDEPENDENT_AMBULATORY_CARE_PROVIDER_SITE_OTHER): Admitting: Ophthalmology

## 2023-10-20 ENCOUNTER — Encounter (INDEPENDENT_AMBULATORY_CARE_PROVIDER_SITE_OTHER): Payer: Self-pay | Admitting: Ophthalmology

## 2023-10-20 DIAGNOSIS — H35372 Puckering of macula, left eye: Secondary | ICD-10-CM

## 2023-10-20 DIAGNOSIS — I10 Essential (primary) hypertension: Secondary | ICD-10-CM | POA: Diagnosis not present

## 2023-10-20 DIAGNOSIS — H35033 Hypertensive retinopathy, bilateral: Secondary | ICD-10-CM

## 2023-10-20 DIAGNOSIS — H35352 Cystoid macular degeneration, left eye: Secondary | ICD-10-CM | POA: Diagnosis not present

## 2023-10-20 DIAGNOSIS — Z961 Presence of intraocular lens: Secondary | ICD-10-CM | POA: Diagnosis not present

## 2023-10-20 DIAGNOSIS — H33311 Horseshoe tear of retina without detachment, right eye: Secondary | ICD-10-CM

## 2023-10-20 DIAGNOSIS — H40052 Ocular hypertension, left eye: Secondary | ICD-10-CM | POA: Diagnosis not present

## 2023-10-20 DIAGNOSIS — H3322 Serous retinal detachment, left eye: Secondary | ICD-10-CM | POA: Diagnosis not present

## 2023-10-20 MED ORDER — LOTEPREDNOL ETABONATE 0.5 % OP SUSP: 1.0000 [drp] | Freq: Two times a day (BID) | OPHTHALMIC | 3 refills | Status: DC

## 2023-10-20 MED ORDER — TRIAMCINOLONE ACETONIDE 40 MG/ML IJ SUSP FOR KALEIDOSCOPE
4.0000 mg | INTRAMUSCULAR | Status: AC | PRN
  Administered 2023-10-20: 4 mg via INTRAVITREAL

## 2023-10-20 MED ORDER — BRIMONIDINE TARTRATE 0.2 % OP SOLN: OPHTHALMIC | 10 refills | Status: AC

## 2023-10-20 MED ORDER — DORZOLAMIDE HCL-TIMOLOL MAL 2-0.5 % OP SOLN: 1.0000 [drp] | Freq: Three times a day (TID) | OPHTHALMIC | 10 refills | Status: DC

## 2023-10-21 ENCOUNTER — Other Ambulatory Visit (INDEPENDENT_AMBULATORY_CARE_PROVIDER_SITE_OTHER): Payer: Self-pay | Admitting: Ophthalmology

## 2023-10-30 ENCOUNTER — Other Ambulatory Visit (HOSPITAL_COMMUNITY): Payer: Self-pay

## 2023-11-05 NOTE — Progress Notes (Signed)
 [facility] - Clinic Note  11/17/2023    CHIEF COMPLAINT Patient presents for Retina Follow Up   HISTORY OF PRESENT ILLNESS: Alicia Miranda is a 68 y.o. female who presents to the clinic today for:   HPI     Retina Follow Up   In left eye.  This started 4 weeks ago.  Duration of 4 weeks.  Since onset it is stable.  I, the attending physician,  performed the HPI with the patient and updated documentation appropriately.        Comments   4 week retina follow up CME pt states vision seems little blurred today and having some watering denies any flashes or floaters she is using Brimonidine  TID OS, Cosopt  TID OS      Last edited by Ronelle Coffee, MD on 11/17/2023  5:03 PM.     Pt states vision slightly blurry today OU.  Referring physician: Rae Bugler, MD [phone] [place],  [place] [address]  HISTORICAL INFORMATION:   Selected notes from the MEDICAL RECORD NUMBER Referred by Dr. Donalynn Fry for possible RD OS LEE: 03/16/02023 Ocular Hx-  PMH- HTN    CURRENT MEDICATIONS: Current Outpatient Medications (Ophthalmic Drugs)  Medication Sig   brimonidine  (ALPHAGAN ) 0.2 % ophthalmic solution INSTILL 1 DROP INTO LEFT EYE THREE TIMES DAILY   Bromfenac  Sodium (PROLENSA ) 0.07 % SOLN Place 1 drop into the left eye daily. PT NEEDS 2 BOTTLES DISPENSED AT A TIME-CALL IF COUPON IS NEEDED   Bromfenac  Sodium 0.09 % SOLN Place 1 drop into the left eye 4 (four) times daily.   dorzolamide -timolol  (COSOPT ) 2-0.5 % ophthalmic solution Place 1 drop into the left eye 3 (three) times daily.   erythromycin  ophthalmic ointment Place 1 Application into the left eye at bedtime. (Patient not taking: Reported on 05/13/2023)   latanoprost  (XALATAN ) 0.005 % ophthalmic solution Place 1 drop into the left eye at bedtime.   loteprednol  (LOTEMAX ) 0.5 % ophthalmic suspension INSTILL 1 DROP  INTO LEFT EYE TWICE DAILY   LOTEPREDNOL  ETABONATE OP Place 1 drop into the right eye 4  (four) times daily.   prednisoLONE  acetate (PRED FORTE ) 1 % ophthalmic suspension INSTILL 1 DROP INTO LEFT EYE 4 TIMES DAILY (Patient not taking: Reported on 05/13/2023)   prednisoLONE  acetate (PRED FORTE ) 1 % ophthalmic suspension Place 1 drop into the left eye 4 (four) times daily. (Patient not taking: Reported on 05/13/2023)   No current facility-administered medications for this visit. (Ophthalmic Drugs)   Current Outpatient Medications (Other)  Medication Sig   APPLE CIDER VINEGAR PO Take 2 tablets by mouth daily. gummies   Cholecalciferol (VITAMIN D3) 10 MCG (400 UNIT) tablet Take 400 Units by mouth daily.   CINNAMON PO Take 1,000 mg by mouth every other day.   COLLAGEN PO Take 3 capsules by mouth daily.   Denosumab  (PROLIA  [place]) Inject 60 mg into the skin every 6 (six) months.   FEROSUL 325 (65 Fe) MG tablet Take 325 mg by mouth daily.   fexofenadine (ALLEGRA) 180 MG tablet Take 180 mg by mouth daily.   fluticasone (FLONASE) 50 MCG/ACT nasal spray Place 1 spray into both nostrils daily.   furosemide (LASIX) 40 MG tablet Take 40 mg by mouth 2 (two) times daily.   Glucosamine HCl (GLUCOSAMINE PO) Take 2 tablets by mouth daily.   losartan (COZAAR) 50 MG tablet Take 100 mg by mouth daily.   Magnesium 400 MG CAPS Take 400 mg by mouth  daily.   Melatonin 5 MG CAPS Take 10 mg by mouth at bedtime.   metformin (FORTAMET) 500 MG (OSM) 24 hr tablet Take 500 mg by mouth daily with breakfast.   metformin (FORTAMET) 500 MG (OSM) 24 hr tablet    pantoprazole (PROTONIX) 40 MG tablet Take 40 mg by mouth 2 (two) times daily.   rosuvastatin (CRESTOR) 20 MG tablet Take 20 mg by mouth daily.   Semaglutide -Weight Management (WEGOVY ) 1 MG/0.5ML SOAJ Inject 1 mg into the skin once a week.   Semaglutide -Weight Management (WEGOVY ) 1 MG/0.5ML SOAJ Inject 1 mg into the skin once a week.   Semaglutide -Weight Management (WEGOVY ) 1.7 MG/0.75ML SOAJ Inject 1.7 mg under the skin once weekly   Semaglutide -Weight  Management (WEGOVY ) 1.7 MG/0.75ML SOAJ Inject 1.7 mg into the skin once a week.   Semaglutide -Weight Management (WEGOVY ) 2.4 MG/0.75ML SOAJ Inject 2.4 mg into the skin once a week.   TURMERIC PO Take 2 tablets by mouth daily.   No current facility-administered medications for this visit. (Other)   REVIEW OF SYSTEMS: ROS   Positive for: Musculoskeletal, Cardiovascular, Eyes, Respiratory Negative for: Constitutional, Gastrointestinal, Neurological, Skin, Genitourinary, HENT, Endocrine, Psychiatric, Allergic/Imm, Heme/Lymph Last edited by Alise Appl, COT on 11/17/2023  1:54 PM.      ALLERGIES Allergies  Allergen Reactions   Codeine Itching   PAST MEDICAL HISTORY Past Medical History:  Diagnosis Date   Allergy    Arthritis    legs   Asthma    GERD (gastroesophageal reflux disease)    Hyperlipidemia    Hypertension    Past Surgical History:  Procedure Laterality Date   ABDOMINAL HYSTERECTOMY     CHOLECYSTECTOMY     COLONOSCOPY     GAS INSERTION Left 09/05/2021   Procedure: INSERTION OF GAS - C3F8;  Surgeon: Ronelle Coffee, MD;  Location: [facility] OR;  Service: Ophthalmology;  Laterality: Left;   GAS/FLUID EXCHANGE Left 09/05/2021   Procedure: GAS/FLUID EXCHANGE;  Surgeon: Ronelle Coffee, MD;  Location: [facility] OR;  Service: Ophthalmology;  Laterality: Left;   PARS PLANA VITRECTOMY Left 09/05/2021   Procedure: PARS PLANA VITRECTOMY WITH 25 GAUGE;  Surgeon: Ronelle Coffee, MD;  Location: [facility] OR;  Service: Ophthalmology;  Laterality: Left;   PERFLUORONE INJECTION Left 09/05/2021   Procedure: PERFLUORON INJECTION;  Surgeon: Ronelle Coffee, MD;  Location: [facility] OR;  Service: Ophthalmology;  Laterality: Left;   PHOTOCOAGULATION WITH LASER Left 09/05/2021   Procedure: PHOTOCOAGULATION WITH LASER;  Surgeon: Ronelle Coffee, MD;  Location: [facility] OR;  Service: Ophthalmology;  Laterality: Left;   SCLERAL BUCKLE Left 09/05/2021   Procedure: SCLERAL BUCKLE;  Surgeon: Ronelle Coffee, MD;  Location: [facility] OR;   Service: Ophthalmology;  Laterality: Left;   UPPER GASTROINTESTINAL ENDOSCOPY     FAMILY HISTORY Family History  Problem Relation Age of Onset   Retinal detachment Sister    Diabetes Sister    Glaucoma Brother    Diabetes Brother    Colon cancer Brother        dx at 40   Esophageal cancer Neg Hx    Rectal cancer Neg Hx    Stomach cancer Neg Hx    SOCIAL HISTORY Social History   Tobacco Use   Smoking status: Former   Smokeless tobacco: Never   Tobacco comments:    quit smoking in 2002 or 2003  Vaping Use   Vaping status: Never Used  Substance Use Topics   Alcohol use: Yes    Comment: very rarely   Drug use: No  OPHTHALMIC EXAM:  Base Eye Exam     Visual Acuity (Snellen - Linear)       Right Left   Dist [place] 20/20 -2 20/200 -1   Dist ph [place]  NI         Tonometry (Tonopen, 1:59 PM)       Right Left   Pressure 10 18         Pupils       Pupils Dark Light Shape React APD   Right PERRL 3 2 Round Brisk None   Left PERRL 3 2 Round Brisk None         Visual Fields       Left Right    Full Full         Extraocular Movement       Right Left    Full, Ortho Full, Ortho         Neuro/Psych     Oriented x3: Yes   Mood/Affect: Normal         Dilation     Both eyes: 2.5% Phenylephrine  @ 1:59 PM           Slit Lamp and Fundus Exam     External Exam       Right Left   External  Periorbital edema         Slit Lamp Exam       Right Left   Lids/Lashes Dermatochalasis - upper lid Dermatochalasis - upper lid, periorbital erythema   Conjunctiva/Sclera white and quiet White and quiet, STK ST quad--fading   Cornea trace PEE, mild tear film debris, well healed cataract wound trace PEE, mild tear film debris, well healed cataract wound, inferior paracentral corneal haze   Anterior Chamber deep and clear deep and clear   Iris round and dilated round and dilated   Lens PC IOL in good position PC IOL in good position   Anterior  Vitreous syneresis, asteroid hyalosis, PVD post vitrectomy, clear -- no residual kenalog          Fundus Exam       Right Left   Disc Pink and Sharp mild Pallor, Sharp rim, Compact   C/D Ratio 0.4 0.3   Macula flat, good foveal reflex, mild RPE mottling, No heme or edema Flat, Blunted foveal reflex, RPE mottling, ERM with striae greatest nasal mac, diffuse edema/cystic changes centrally and nasally--slightly improved, shallow SRF slightly improved, No heme   Vessels attenuated, Tortuous attenuated, Tortuous   Periphery Attached, operculated hole @ 1030 with mild surrounding pigment -- good laser surrounding, no SRF, no new RT/RD Attached, Trace pockets of SRF superior to disc with fibrosis, focal fibrosis at 0600 posterior to buckle, Retina attached over buckle, good buckle height, good laser over buckle and around tears, PRE-OP: small tear with fibrosis @ 0200, bullous, temporal RD from 1230 to 0430           IMAGING AND PROCEDURES  Imaging and Procedures for 11/17/2023  OCT, Retina - OU - Both Eyes        Right Eye Quality was good. Central Foveal Thickness: 277. Progression has been stable. Findings include normal foveal contour, no IRF, no SRF.   Left Eye Quality was good. Central Foveal Thickness: 629. Progression has improved. Findings include abnormal foveal contour, epiretinal membrane, intraretinal fluid, macular pucker, subretinal fluid (ERM with blunted foveal contour and mild pucker, persistent diffuse edema w/ +IRF, SRF -- improved).   Notes  *Images captured and stored on drive  Diagnosis / Impression:  OD: NFP; no IRF/SRF OS: ERM with blunted foveal contour and mild pucker, persistent diffuse edema w/ +IRF, SRF -- improved  Clinical management:  See below  Abbreviations: NFP - Normal foveal profile. CME - cystoid macular edema. PED - pigment epithelial detachment. IRF - intraretinal fluid. SRF - subretinal fluid. EZ - ellipsoid zone. ERM - epiretinal membrane.  ORA - outer retinal atrophy. ORT - outer retinal tubulation. SRHM - subretinal hyper-reflective material. IRHM - intraretinal hyper-reflective material      Intravitreal Injection, Pharmacologic Agent - OS - Left Eye        Time Out 11/17/2023. 3:40 PM. Confirmed correct patient, procedure, site, and patient consented.   Anesthesia Topical anesthesia was used. Anesthetic medications included Lidocaine  2%, Proparacaine  0.5%.   Procedure Preparation included 5% betadine to ocular surface, eyelid speculum. A 27 gauge needle was used.   Injection: 4 mg triamcinolone  acetonide 40 MG/ML   Route: Intravitreal, Site: Left Eye   NDC: 1610-9604-54, Lot: [id number], Expiration date: 12/13/2024, Waste: 0.9 mL   Post-op Post injection exam found visual acuity of at least counting fingers. The patient tolerated the procedure well. There were no complications. The patient received written and verbal post procedure care education.   Notes  An AC tap was performed following injection due to elevated IOP using a 30 gauge needle on a syringe with the plunger removed. The needle was placed at the limbus at 5 oclock and approximately 0.07cc of aqueous was removed from the anterior chamber. Betadine was applied to the tap area before and after the paracentesis was performed. There were no complications. The patient tolerated the procedure well. The IOP was rechecked and was found to be ~11 mmHg by digital palpation.             ASSESSMENT/PLAN:    ICD-10-CM   1. Left retinal detachment  H33.22 OCT, Retina - OU - Both Eyes    2. Cystoid macular edema of left eye  H35.352 OCT, Retina - OU - Both Eyes    Intravitreal Injection, Pharmacologic Agent - OS - Left Eye    triamcinolone  acetonide (KENALOG -40) injection 4 mg    3. Epiretinal membrane (ERM) of left eye  H35.372     4. Retinal tear of right eye  H33.311     5. Essential hypertension  I10     6. Hypertensive retinopathy of both eyes   H35.033     7. Pseudophakia of both eyes  Z96.1       1,2. Rhegmatogenous retinal detachment with retinal hole, left eye - bullous, temporal, mac off detachment, onset of foveal involvement Tuesday, 03.14.23 by history - detached from 1230 to 430 oclock, fovea off, small tear at 0200 - s/p SBP + PPV/PFO/EL/FAX/14% C3F8 OS, 03.23.2023 - retina attached and in good position -- good buckle height and laser around breaks  - FA 12.18.23 shows OD Focal staining ST periphery corresponding to laser retinopexy, OS Mild Hyperfluorescence of disc, mild perifoveal staining SN to fovea--?petaloid -- suggestive of CME - OCT shows ERM with blunted foveal contour and mild pucker, persistent diffuse edema w/ +IRF, SRF -- slightly improved - s/p STK #1 (11.20.23) for CME, #2 (02.28.24), #3 (07.03.24) - s/p IVA OS #1 (05.01.24), #2 (05.29.24), #3 (06.26.24) -- IVA resistance  - s/p IVE OS #1 (07.24.24), #2 (08.21.24), #3 (09.18.24), #4 (10.16.24)  - s/p IVK OS #1 (11.13.24), #2 (12.11.24), #3 (01.08.25), #4 (05.06.25)  - s/p IVV OS #1 (02.05.25 --  sample) #2 sample (03.10.25), #3 (sample 04.07.25)  **h/o good CME response to IVK, but poor steroid/IOP response to IVK** - IOP OS 18 today -- h/o steroid response - BCVA OS 20/200--improved from 20/350 - continue holding Prolensa  (h/o medicamentosa)  - continue holding PF (h/o steroid response) -- cont Lotemax  BID OS  - continue Brimonidine  TID OS, Cosopt  TID OS - discontinued Latanoprost  due to possible contribution to CME - recommend IVK OS #5 today, 06.03.25 - IVK informed consent obtained and signed, 11.13.24 - IVE informed consent obtained and signed, 07.24.24 (OS) - IVV informed consent obtained and signed, 02.05.25 - see procedure note - Eylea  authorized for 2025 -- [employer] funding unavailable at this time - discussed possible use of Xipere  for chronic inflammation / CME - f/u 4 weeks - DFE, OCT, possible injection   3. Epiretinal membrane, left  eye  - ERM with persistent cystic edema - OCT shows persistent ERM w/ cystic changes slightly improved - monitor for now, but discussed possibility of repeat PPV w/ membrane peel to remove ERM if vision fails to improve  4. Retinal hole OD  - operculated retinal hole located at 1030 with mild surrounding pigment, no SRF - s/p laser retinopexy OD (03.16.23) -- good laser surrounding - continue to monitor  5,6. Hypertensive retinopathy OU - discussed importance of tight BP control - continue to monitor  7. Pseudophakia OU  - s/p CE/IOL OS (Dr. Carloyn Chi, 10.04.23), CE with IOL OD (11.01.23, Dr. Carloyn Chi)  - IOL in good position, doing well  - continue to monitor  8. Ocular hypertension OS  - h/o steroid response  - tmax 33 on 01.29.24  - s/p SLT OS w/ Dr. Paulene Boron on 12.05.23  - IOP today: 12  - continue Brimonidine  TID OS, Cosopt  TID OS - discontinued Latanoprost  due to possible contribution to CME  Ophthalmic Meds Ordered this visit:  Meds ordered this encounter  Medications   triamcinolone  acetonide (KENALOG -40) injection 4 mg     Return in about 4 weeks (around 12/15/2023) for post op CME OS - DFE, OCT, Possible Injxn.  There are no Patient Instructions on file for this visit.  This document serves as a record of services personally performed by Jeanice Millard, MD, PhD. It was created on their behalf by Olene Berne, COT an ophthalmic technician. The creation of this record is the provider's dictation and/or activities during the visit.    Electronically signed by:  Olene Berne, COT  11/21/23 9:54 AM  This document serves as a record of services personally performed by Jeanice Millard, MD, PhD. It was created on their behalf by Diona Franklin, COMT. The creation of this record is the provider's dictation and/or activities during the visit.  Electronically signed by: Diona Franklin, COMT 11/21/23 9:54 AM  Jeanice Millard, M.D., Ph.D. Diseases & Surgery of the Retina and  Vitreous [facility]  I have reviewed the above documentation for accuracy and completeness, and I agree with the above. Jeanice Millard, M.D., Ph.D. 11/21/23 9:58 AM   Abbreviations: M myopia (nearsighted); A astigmatism; H hyperopia (farsighted); P presbyopia; Mrx spectacle prescription;  CTL contact lenses; OD right eye; OS left eye; OU both eyes  XT exotropia; ET esotropia; PEK punctate epithelial keratitis; PEE punctate epithelial erosions; DES dry eye syndrome; MGD meibomian gland dysfunction; ATs artificial tears; PFAT's preservative free artificial tears; NSC nuclear sclerotic cataract; PSC posterior subcapsular cataract; ERM epi-retinal membrane; PVD posterior vitreous detachment;  RD retinal detachment; DM diabetes mellitus; DR diabetic retinopathy; NPDR non-proliferative diabetic retinopathy; PDR proliferative diabetic retinopathy; CSME clinically significant macular edema; DME diabetic macular edema; dbh dot blot hemorrhages; CWS cotton wool spot; POAG primary open angle glaucoma; C/D cup-to-disc ratio; HVF humphrey visual field; GVF goldmann visual field; OCT optical coherence tomography; IOP intraocular pressure; BRVO Branch retinal vein occlusion; CRVO central retinal vein occlusion; CRAO central retinal artery occlusion; BRAO branch retinal artery occlusion; RT retinal tear; SB scleral buckle; PPV pars plana vitrectomy; VH Vitreous hemorrhage; PRP panretinal laser photocoagulation; IVK intravitreal kenalog ; VMT vitreomacular traction; MH Macular hole;  NVD neovascularization of the disc; NVE neovascularization elsewhere; AREDS age related eye disease study; ARMD age related macular degeneration; POAG primary open angle glaucoma; EBMD epithelial/anterior basement membrane dystrophy; ACIOL anterior chamber intraocular lens; IOL intraocular lens; PCIOL posterior chamber intraocular lens; Phaco/IOL phacoemulsification with intraocular lens placement; PRK photorefractive  keratectomy; LASIK laser assisted in situ keratomileusis; HTN hypertension; DM diabetes mellitus; COPD chronic obstructive pulmonary disease

## 2023-11-12 ENCOUNTER — Other Ambulatory Visit (HOSPITAL_COMMUNITY): Payer: Self-pay

## 2023-11-12 MED ORDER — WEGOVY 2.4 MG/0.75ML ~~LOC~~ SOAJ
2.4000 mg | SUBCUTANEOUS | 1 refills | Status: DC
  Filled 2023-11-12: qty 3, 28d supply, fill #0
  Filled 2023-11-27 – 2023-12-05 (×2): qty 3, 28d supply, fill #1
  Filled ????-??-??: fill #1

## 2023-11-17 ENCOUNTER — Ambulatory Visit (INDEPENDENT_AMBULATORY_CARE_PROVIDER_SITE_OTHER): Admitting: Ophthalmology

## 2023-11-17 ENCOUNTER — Encounter (INDEPENDENT_AMBULATORY_CARE_PROVIDER_SITE_OTHER): Payer: Self-pay | Admitting: Ophthalmology

## 2023-11-17 DIAGNOSIS — H33311 Horseshoe tear of retina without detachment, right eye: Secondary | ICD-10-CM

## 2023-11-17 DIAGNOSIS — H35033 Hypertensive retinopathy, bilateral: Secondary | ICD-10-CM | POA: Diagnosis not present

## 2023-11-17 DIAGNOSIS — I10 Essential (primary) hypertension: Secondary | ICD-10-CM | POA: Diagnosis not present

## 2023-11-17 DIAGNOSIS — H35372 Puckering of macula, left eye: Secondary | ICD-10-CM | POA: Diagnosis not present

## 2023-11-17 DIAGNOSIS — Z961 Presence of intraocular lens: Secondary | ICD-10-CM

## 2023-11-17 DIAGNOSIS — H3322 Serous retinal detachment, left eye: Secondary | ICD-10-CM | POA: Diagnosis not present

## 2023-11-17 DIAGNOSIS — H35352 Cystoid macular degeneration, left eye: Secondary | ICD-10-CM | POA: Diagnosis not present

## 2023-11-17 MED ORDER — TRIAMCINOLONE ACETONIDE 40 MG/ML IJ SUSP FOR KALEIDOSCOPE
4.0000 mg | INTRAMUSCULAR | Status: AC | PRN
  Administered 2023-11-17: 4 mg via INTRAVITREAL

## 2023-11-19 ENCOUNTER — Other Ambulatory Visit (HOSPITAL_COMMUNITY): Payer: Self-pay

## 2023-11-19 ENCOUNTER — Other Ambulatory Visit (INDEPENDENT_AMBULATORY_CARE_PROVIDER_SITE_OTHER): Payer: Self-pay

## 2023-11-19 ENCOUNTER — Other Ambulatory Visit (INDEPENDENT_AMBULATORY_CARE_PROVIDER_SITE_OTHER): Payer: Self-pay | Admitting: Ophthalmology

## 2023-11-19 MED ORDER — DORZOLAMIDE HCL-TIMOLOL MAL 2-0.5 % OP SOLN
1.0000 [drp] | Freq: Three times a day (TID) | OPHTHALMIC | 10 refills | Status: AC
  Filled 2023-11-19 (×2): qty 10, 67d supply, fill #0
  Filled 2023-12-05: qty 10, 67d supply, fill #1
  Filled 2023-12-24 – 2024-02-28 (×2): qty 10, 67d supply, fill #2
  Filled 2024-03-23 – 2024-04-16 (×2): qty 10, 67d supply, fill #3
  Filled 2024-05-09 (×2): qty 10, 67d supply, fill #4
  Filled 2024-07-13: qty 10, 67d supply, fill #5
  Filled 2024-07-14: qty 10, 67d supply, fill #0
  Filled ????-??-??: fill #0
  Filled ????-??-??: fill #2

## 2023-11-20 ENCOUNTER — Other Ambulatory Visit (HOSPITAL_COMMUNITY): Payer: Self-pay

## 2023-11-20 ENCOUNTER — Other Ambulatory Visit: Payer: Self-pay

## 2023-11-27 ENCOUNTER — Other Ambulatory Visit (HOSPITAL_COMMUNITY): Payer: Self-pay

## 2023-12-05 ENCOUNTER — Other Ambulatory Visit (HOSPITAL_COMMUNITY): Payer: Self-pay

## 2023-12-07 ENCOUNTER — Other Ambulatory Visit (HOSPITAL_COMMUNITY): Payer: Self-pay

## 2023-12-10 ENCOUNTER — Other Ambulatory Visit (HOSPITAL_COMMUNITY): Payer: Self-pay

## 2023-12-10 MED ORDER — WEGOVY 2.4 MG/0.75ML ~~LOC~~ SOAJ
2.4000 mg | SUBCUTANEOUS | 0 refills | Status: DC
  Filled 2023-12-10 – 2024-01-01 (×4): qty 9, 84d supply, fill #0
  Filled ????-??-??: fill #0

## 2023-12-11 ENCOUNTER — Other Ambulatory Visit (HOSPITAL_COMMUNITY): Payer: Self-pay

## 2023-12-14 DIAGNOSIS — E785 Hyperlipidemia, unspecified: Secondary | ICD-10-CM | POA: Diagnosis not present

## 2023-12-14 DIAGNOSIS — M810 Age-related osteoporosis without current pathological fracture: Secondary | ICD-10-CM | POA: Diagnosis not present

## 2023-12-14 DIAGNOSIS — J45909 Unspecified asthma, uncomplicated: Secondary | ICD-10-CM | POA: Diagnosis not present

## 2023-12-14 DIAGNOSIS — M1990 Unspecified osteoarthritis, unspecified site: Secondary | ICD-10-CM | POA: Diagnosis not present

## 2023-12-14 NOTE — Progress Notes (Signed)
 [facility] - Clinic Note  12/15/2023    CHIEF COMPLAINT Patient presents for Retina Follow Up   HISTORY OF PRESENT ILLNESS: Alicia Miranda is a 68 y.o. 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  This started 2 years ago.  Duration of 4 weeks.  Since onset it is stable.  I, the attending physician,  performed the HPI with the patient and updated documentation appropriately.        Comments   Pt states no changes in vision. Pt states her left eye has been feeling irritated and she is concerned the pressure is back up. Pt is consistent using PF drops BID OU, Brimonidine  TID OS, Cosopt  TID OS. Pt denies FOL/floaters.      Last edited by Valdemar Rogue, MD on 12/15/2023  4:19 PM.      Pt states vision slightly blurry today OU.  Referring physician: Seabron Lenis, MD [address] [place],  [place] [address]  HISTORICAL INFORMATION:   Selected notes from the MEDICAL RECORD NUMBER Referred by Dr. Nanetta Sharps for possible RD OS LEE: 03/16/02023 Ocular Hx-  PMH- HTN    CURRENT MEDICATIONS: Current Outpatient Medications (Ophthalmic Drugs)  Medication Sig   brimonidine  (ALPHAGAN ) 0.2 % ophthalmic solution INSTILL 1 DROP INTO LEFT EYE THREE TIMES DAILY   dorzolamide -timolol  (COSOPT ) 2-0.5 % ophthalmic solution Place 1 drop into the left eye 3 (three) times daily.   latanoprost  (XALATAN ) 0.005 % ophthalmic solution Place 1 drop into the left eye at bedtime.   loteprednol  (LOTEMAX ) 0.5 % ophthalmic suspension INSTILL 1 DROP  INTO LEFT EYE TWICE DAILY   Bromfenac  Sodium (PROLENSA ) 0.07 % SOLN Place 1 drop into the left eye daily. PT NEEDS 2 BOTTLES DISPENSED AT A TIME-CALL IF COUPON IS NEEDED   Bromfenac  Sodium 0.09 % SOLN Place 1 drop into the left eye 4 (four) times daily.   erythromycin  ophthalmic ointment Place 1 Application into the left  eye at bedtime. (Patient not taking: Reported on 05/13/2023)   latanoprost  (XALATAN ) 0.005 % ophthalmic solution Place 1 drop into the left eye at bedtime.   LOTEPREDNOL  ETABONATE OP Place 1 drop into the right eye 4 (four) times daily.   prednisoLONE  acetate (PRED FORTE ) 1 % ophthalmic suspension INSTILL 1 DROP INTO LEFT EYE 4 TIMES DAILY (Patient not taking: Reported on 05/13/2023)   prednisoLONE  acetate (PRED FORTE ) 1 % ophthalmic suspension Place 1 drop into the left eye 4 (four) times daily. (Patient not taking: Reported on 05/13/2023)   No current facility-administered medications for this visit. (Ophthalmic Drugs)   Current Outpatient Medications (Other)  Medication Sig   APPLE CIDER VINEGAR PO Take 2 tablets by mouth daily. gummies   Cholecalciferol (VITAMIN D3) 10 MCG (400 UNIT) tablet Take 400 Units by mouth daily.   COLLAGEN PO Take 3 capsules by mouth daily.   Denosumab  (PROLIA  [place]) Inject 60 mg into the skin every 6 (six) months.   fluticasone (FLONASE) 50 MCG/ACT nasal spray Place 1 spray into both nostrils daily.   furosemide (LASIX) 40 MG tablet Take 40 mg by mouth 2 (two) times daily.   Glucosamine HCl (GLUCOSAMINE PO) Take 2 tablets by mouth daily.   losartan (COZAAR) 50 MG tablet Take 100 mg by mouth daily.   Magnesium 400 MG CAPS Take 400 mg by mouth daily.  Melatonin 5 MG CAPS Take 10 mg by mouth at bedtime.   metformin (FORTAMET) 500 MG (OSM) 24 hr tablet Take 500 mg by mouth daily with breakfast.   pantoprazole (PROTONIX) 40 MG tablet Take 40 mg by mouth 2 (two) times daily.   rosuvastatin (CRESTOR) 20 MG tablet Take 20 mg by mouth daily.   Semaglutide -Weight Management (WEGOVY ) 1.7 MG/0.75ML SOAJ Inject 1.7 mg under the skin once weekly   Semaglutide -Weight Management (WEGOVY ) 1.7 MG/0.75ML SOAJ Inject 1.7 mg into the skin once a week.   Semaglutide -Weight Management (WEGOVY ) 2.4 MG/0.75ML SOAJ Inject 2.4 mg into the skin once a week.   TURMERIC PO Take 2 tablets by  mouth daily.   CINNAMON PO Take 1,000 mg by mouth every other day.   FEROSUL 325 (65 Fe) MG tablet Take 325 mg by mouth daily.   fexofenadine (ALLEGRA) 180 MG tablet Take 180 mg by mouth daily.   metformin (FORTAMET) 500 MG (OSM) 24 hr tablet    Semaglutide -Weight Management (WEGOVY ) 1 MG/0.5ML SOAJ Inject 1 mg into the skin once a week.   Semaglutide -Weight Management (WEGOVY ) 1 MG/0.5ML SOAJ Inject 1 mg into the skin once a week.   No current facility-administered medications for this visit. (Other)   REVIEW OF SYSTEMS: ROS   Positive for: Musculoskeletal, Cardiovascular, Eyes, Respiratory Negative for: Constitutional, Gastrointestinal, Neurological, Skin, Genitourinary, HENT, Endocrine, Psychiatric, Allergic/Imm, Heme/Lymph Last edited by Elnor Avelina RAMAN, COT on 12/15/2023  2:03 PM.     ALLERGIES Allergies  Allergen Reactions   Codeine Itching   PAST MEDICAL HISTORY Past Medical History:  Diagnosis Date   Allergy    Arthritis    legs   Asthma    GERD (gastroesophageal reflux disease)    Hyperlipidemia    Hypertension    Past Surgical History:  Procedure Laterality Date   ABDOMINAL HYSTERECTOMY     CHOLECYSTECTOMY     COLONOSCOPY     GAS INSERTION Left 09/05/2021   Procedure: INSERTION OF GAS - C3F8;  Surgeon: Valdemar Rogue, MD;  Location: [facility] OR;  Service: Ophthalmology;  Laterality: Left;   GAS/FLUID EXCHANGE Left 09/05/2021   Procedure: GAS/FLUID EXCHANGE;  Surgeon: Valdemar Rogue, MD;  Location: [facility] OR;  Service: Ophthalmology;  Laterality: Left;   PARS PLANA VITRECTOMY Left 09/05/2021   Procedure: PARS PLANA VITRECTOMY WITH 25 GAUGE;  Surgeon: Valdemar Rogue, MD;  Location: [facility] OR;  Service: Ophthalmology;  Laterality: Left;   PERFLUORONE INJECTION Left 09/05/2021   Procedure: PERFLUORON INJECTION;  Surgeon: Valdemar Rogue, MD;  Location: [facility] OR;  Service: Ophthalmology;  Laterality: Left;   PHOTOCOAGULATION WITH LASER Left 09/05/2021   Procedure: PHOTOCOAGULATION WITH LASER;   Surgeon: Valdemar Rogue, MD;  Location: [facility] OR;  Service: Ophthalmology;  Laterality: Left;   SCLERAL BUCKLE Left 09/05/2021   Procedure: SCLERAL BUCKLE;  Surgeon: Valdemar Rogue, MD;  Location: [facility] OR;  Service: Ophthalmology;  Laterality: Left;   UPPER GASTROINTESTINAL ENDOSCOPY     FAMILY HISTORY Family History  Problem Relation Age of Onset   Retinal detachment Sister    Diabetes Sister    Glaucoma Brother    Diabetes Brother    Colon cancer Brother        dx at 35   Esophageal cancer Neg Hx    Rectal cancer Neg Hx    Stomach cancer Neg Hx    SOCIAL HISTORY Social History   Tobacco Use   Smoking status: Former   Smokeless tobacco: Never   Tobacco comments:  quit smoking in 2002 or 2003  Vaping Use   Vaping status: Never Used  Substance Use Topics   Alcohol use: Yes    Comment: very rarely   Drug use: No       OPHTHALMIC EXAM:  Base Eye Exam     Visual Acuity (Snellen - Linear)       Right Left   Dist [place] 20/25 -2 20/250 +1   Dist ph [place] NI NI         Tonometry (Tonopen, 1:54 PM)       Right Left   Pressure 15 31         Pupils       Pupils Dark Light Shape React APD   Right PERRL 3 2 Round Brisk None   Left PERRL 3 2 Round Slow None         Visual Fields       Left Right    Full Full         Extraocular Movement       Right Left    Full, Ortho Full, Ortho         Neuro/Psych     Oriented x3: Yes   Mood/Affect: Normal         Dilation     Both eyes: 1.0% Mydriacyl , 2.5% Phenylephrine  @ 1:54 PM           Slit Lamp and Fundus Exam     External Exam       Right Left   External  Periorbital edema         Slit Lamp Exam       Right Left   Lids/Lashes Dermatochalasis - upper lid Dermatochalasis - upper lid, periorbital erythema   Conjunctiva/Sclera white and quiet White and quiet, STK ST quad--gone   Cornea trace PEE, mild tear film debris, well healed cataract wound trace PEE, mild tear film debris, well healed  cataract wound, inferior paracentral corneal haze   Anterior Chamber deep and clear deep and clear   Iris round and dilated round and dilated   Lens PC IOL in good position PC IOL in good position   Anterior Vitreous syneresis, mild asteroid hyalosis, PVD post vitrectomy, clear -- no residual kenalog          Fundus Exam       Right Left   Disc Pink and Sharp mild Pallor, Sharp rim, Compact   C/D Ratio 0.4 0.3   Macula flat, good foveal reflex, mild RPE mottling, No heme or edema Flat, Blunted foveal reflex, RPE mottling, ERM with striae greatest nasal mac, diffuse edema/cystic changes centrally and nasally--slightly improved, shallow SRF slightly improved, No heme   Vessels attenuated, Tortuous attenuated, Tortuous   Periphery Attached, operculated hole @ 1030 with mild surrounding pigment -- good laser surrounding, no SRF, no new RT/RD Attached, Trace pockets of SRF superior to disc with fibrosis, focal fibrosis at 0600 posterior to buckle, Retina attached over buckle, good buckle height, good laser over buckle and around tears, PRE-OP: small tear with fibrosis @ 0200, bullous, temporal RD from 1230 to 0430           IMAGING AND PROCEDURES  Imaging and Procedures for 12/15/2023  OCT, Retina - OU - Both Eyes       Right Eye Quality was good. Central Foveal Thickness: 277. Progression has been stable. Findings include normal foveal contour, no IRF, no SRF.   Left Eye Quality was good. Central Foveal Thickness:  629. Progression has improved. Findings include abnormal foveal contour, epiretinal membrane, intraretinal fluid, macular pucker, subretinal fluid (ERM with blunted foveal contour and mild pucker, persistent diffuse edema w/ +IRF, SRF -- improved).   Notes *Images captured and stored on drive  Diagnosis / Impression:  OD: NFP; no IRF/SRF OS: ERM with blunted foveal contour and mild pucker, persistent diffuse edema w/ +IRF, SRF -- slightly improved  Clinical management:   See below  Abbreviations: NFP - Normal foveal profile. CME - cystoid macular edema. PED - pigment epithelial detachment. IRF - intraretinal fluid. SRF - subretinal fluid. EZ - ellipsoid zone. ERM - epiretinal membrane. ORA - outer retinal atrophy. ORT - outer retinal tubulation. SRHM - subretinal hyper-reflective material. IRHM - intraretinal hyper-reflective material      Injection into Tenon's Capsule - OS - Left Eye       Time Out 12/15/2023. 3:37 PM. Confirmed correct patient, procedure, site, and patient consented.   Anesthesia Topical anesthesia was used. Anesthetic medications included Lidocaine  2%, Proparacaine  0.5%.   Procedure Preparation included 5% betadine to ocular surface, eyelid speculum. A (25g) needle was used.   Injection: 40 mg triamcinolone  acetonide 40 MG/ML   Route: Other, Site: Left Eye   NDC: [id number], Lot: [id number], Expiration date: 12/13/2024, Waste: 0 mL   Post-op Post injection exam found visual acuity of at least counting fingers. The patient tolerated the procedure well. There were no complications. The patient received written and verbal post procedure care education. Post injection medications included ocuflox.   Notes 1.0 cc of Kenalog -40 (40 mg) was injected into subtenon's capsule in the superotemporal quadrant. Betadine was applied to Injection area pre and post-injection then rinsed with sterile BSS. 1 drop of ofloxacin was instilled into the eye. There were no complications. Pt tolerated procedure well.           ASSESSMENT/PLAN:    ICD-10-CM   1. Left retinal detachment  H33.22 OCT, Retina - OU - Both Eyes    2. Cystoid macular edema of left eye  H35.352 Injection into Tenon's Capsule - OS - Left Eye    triamcinolone  acetonide (KENALOG -40) injection 40 mg    3. Epiretinal membrane (ERM) of left eye  H35.372     4. Retinal tear of right eye  H33.311     5. Essential hypertension  I10     6. Hypertensive retinopathy of both  eyes  H35.033     7. 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 Rhegmatogenous retinal detachment with retinal hole, left eye - bullous, temporal, mac off detachment, onset of foveal involvement Tuesday, 03.14.23 by history - detached from 1230 to 430 oclock, fovea off, small tear at 0200 - s/p SBP + PPV/PFO/EL/FAX/14% C3F8 OS, 03.23.2023 - retina attached and in good position -- good buckle height and laser around breaks  - FA 12.18.23 shows OD Focal staining ST periphery corresponding to laser retinopexy, OS Mild Hyperfluorescence of disc, mild perifoveal staining SN to fovea--?petaloid -- suggestive of CME - OCT shows ERM with blunted foveal contour and mild pucker, persistent diffuse edema w/ +IRF, SRF -- slightly improved - s/p STK #1 (11.20.23) for CME, #2 (02.28.24), #3 (07.03.24) - s/p IVA OS #1 (05.01.24), #2 (05.29.24), #3 (06.26.24) -- IVA resistance  - s/p IVE OS #1 (07.24.24), #2 (08.21.24), #3 (09.18.24), #4 (10.16.24)  - s/p IVK OS #1 (11.13.24), #2 (12.11.24), #3 (01.08.25), #4 (  05.06.25), #5 (06.03.25)  - s/p IVV OS #1 (02.05.25 -- sample) #2 sample (03.10.25), #3 (sample 04.07.25)  **h/o good CME response to IVK, but poor steroid/IOP response to IVK** - IOP OS 31 today -- h/o steroid response - BCVA OS 20/250--down from 20/200 - continue holding Prolensa  (h/o medicamentosa)  - continue holding PF (h/o steroid response) -- cont Lotemax  BID OS  - continue Brimonidine  TID OS, Cosopt  TID OS - restart Latanoprost  at bedtime OS.  - recommend STK OS #4 today, 07.01.25 - IVK informed consent obtained and signed, 11.13.24 - IVE informed consent obtained and signed, 07.24.24 (OS) - IVV informed consent obtained and signed, 02.05.25 - see procedure note - Eylea  authorized for 2025 -- [employer] funding unavailable at this time - discussed possible use of Xipere  for chronic inflammation / CME - f/u 4 weeks - DFE, OCT, possible  injection   3. Epiretinal membrane, left eye  - ERM with persistent cystic edema - OCT shows persistent ERM w/ cystic changes slightly improved - monitor for now, but discussed possibility of repeat PPV w/ membrane peel to remove ERM if vision fails to improve  4. Retinal hole OD  - operculated retinal hole located at 1030 with mild surrounding pigment, no SRF - s/p laser retinopexy OD (03.16.23) -- good laser surrounding - continue to monitor  5,6. Hypertensive retinopathy OU - discussed importance of tight BP control - continue to monitor  7. Pseudophakia OU  - s/p CE/IOL OS (Dr. Fleeta, 10.04.23), CE with IOL OD (11.01.23, Dr. Fleeta)  - IOL in good position, doing well  - continue to monitor  8. Ocular hypertension OS  - h/o steroid response  - tmax 33 on 01.29.24  - s/p SLT OS w/ Dr. Austin on 12.05.23  - IOP today: 31  - continue Brimonidine  TID OS, Cosopt  TID OS - restasrt latanoprost  at bedtime OS  Ophthalmic Meds Ordered this visit:  Meds ordered this encounter  Medications   latanoprost  (XALATAN ) 0.005 % ophthalmic solution    Sig: Place 1 drop into the left eye at bedtime.    Dispense:  2.5 mL    Refill:  3   triamcinolone  acetonide (KENALOG -40) injection 40 mg     Return in about 4 weeks (around 01/12/2024) for CME OS, DFE, OCT.  There are no Patient Instructions on file for this visit.  This document serves as a record of services personally performed by Redell JUDITHANN Hans, MD, PhD. It was created on their behalf by Auston Muzzy, COMT. The creation of this record is the provider's dictation and/or activities during the visit.  Electronically signed by: Auston Muzzy, COMT 12/15/23 4:20 PM  Redell JUDITHANN Hans, M.D., Ph.D. Diseases & Surgery of the Retina and Vitreous [facility]  I have reviewed the above documentation for accuracy and completeness, and I agree with the above. Redell JUDITHANN Hans, M.D., Ph.D. 12/15/23 4:22 PM   Abbreviations: M  myopia (nearsighted); A astigmatism; H hyperopia (farsighted); P presbyopia; Mrx spectacle prescription;  CTL contact lenses; OD right eye; OS left eye; OU both eyes  XT exotropia; ET esotropia; PEK punctate epithelial keratitis; PEE punctate epithelial erosions; DES dry eye syndrome; MGD meibomian gland dysfunction; ATs artificial tears; PFAT's preservative free artificial tears; NSC nuclear sclerotic cataract; PSC posterior subcapsular cataract; ERM epi-retinal membrane; PVD posterior vitreous detachment; RD retinal detachment; DM diabetes mellitus; DR diabetic retinopathy; NPDR non-proliferative diabetic retinopathy; PDR proliferative diabetic retinopathy; CSME clinically significant macular edema; DME diabetic macular  edema; dbh dot blot hemorrhages; CWS cotton wool spot; POAG primary open angle glaucoma; C/D cup-to-disc ratio; HVF humphrey visual field; GVF goldmann visual field; OCT optical coherence tomography; IOP intraocular pressure; BRVO Branch retinal vein occlusion; CRVO central retinal vein occlusion; CRAO central retinal artery occlusion; BRAO branch retinal artery occlusion; RT retinal tear; SB scleral buckle; PPV pars plana vitrectomy; VH Vitreous hemorrhage; PRP panretinal laser photocoagulation; IVK intravitreal kenalog ; VMT vitreomacular traction; MH Macular hole;  NVD neovascularization of the disc; NVE neovascularization elsewhere; AREDS age related eye disease study; ARMD age related macular degeneration; POAG primary open angle glaucoma; EBMD epithelial/anterior basement membrane dystrophy; ACIOL anterior chamber intraocular lens; IOL intraocular lens; PCIOL posterior chamber intraocular lens; Phaco/IOL phacoemulsification with intraocular lens placement; PRK photorefractive keratectomy; LASIK laser assisted in situ keratomileusis; HTN hypertension; DM diabetes mellitus; COPD chronic obstructive pulmonary disease

## 2023-12-15 ENCOUNTER — Ambulatory Visit (INDEPENDENT_AMBULATORY_CARE_PROVIDER_SITE_OTHER): Admitting: Ophthalmology

## 2023-12-15 ENCOUNTER — Encounter (INDEPENDENT_AMBULATORY_CARE_PROVIDER_SITE_OTHER): Payer: Self-pay | Admitting: Ophthalmology

## 2023-12-15 DIAGNOSIS — H33311 Horseshoe tear of retina without detachment, right eye: Secondary | ICD-10-CM

## 2023-12-15 DIAGNOSIS — H35033 Hypertensive retinopathy, bilateral: Secondary | ICD-10-CM | POA: Diagnosis not present

## 2023-12-15 DIAGNOSIS — H35352 Cystoid macular degeneration, left eye: Secondary | ICD-10-CM

## 2023-12-15 DIAGNOSIS — H35372 Puckering of macula, left eye: Secondary | ICD-10-CM

## 2023-12-15 DIAGNOSIS — H3322 Serous retinal detachment, left eye: Secondary | ICD-10-CM | POA: Diagnosis not present

## 2023-12-15 DIAGNOSIS — Z961 Presence of intraocular lens: Secondary | ICD-10-CM

## 2023-12-15 DIAGNOSIS — I10 Essential (primary) hypertension: Secondary | ICD-10-CM | POA: Diagnosis not present

## 2023-12-15 DIAGNOSIS — H40052 Ocular hypertension, left eye: Secondary | ICD-10-CM

## 2023-12-15 MED ORDER — TRIAMCINOLONE ACETONIDE 40 MG/ML IJ SUSP FOR KALEIDOSCOPE
40.0000 mg | INTRAMUSCULAR | Status: AC | PRN
  Administered 2023-12-15: 40 mg

## 2023-12-15 MED ORDER — LATANOPROST 0.005 % OP SOLN: 1.0000 [drp] | Freq: Every day | OPHTHALMIC | 3 refills | Status: DC

## 2023-12-24 ENCOUNTER — Other Ambulatory Visit: Payer: Self-pay

## 2023-12-24 ENCOUNTER — Other Ambulatory Visit (HOSPITAL_COMMUNITY): Payer: Self-pay

## 2023-12-25 ENCOUNTER — Other Ambulatory Visit (HOSPITAL_COMMUNITY): Payer: Self-pay

## 2024-01-01 ENCOUNTER — Other Ambulatory Visit (HOSPITAL_COMMUNITY): Payer: Self-pay

## 2024-01-11 NOTE — Progress Notes (Addendum)
 [facility] - Clinic Note  01/19/2024    CHIEF COMPLAINT Patient presents for Retina Follow Up   HISTORY OF PRESENT ILLNESS: Alicia Miranda is a 68 y.o. female who presents to the clinic today for:   HPI     Retina Follow Up   In left eye.  This started 6 weeks ago.  Duration of 6 weeks.  Since onset it is stable.  I, the attending physician,  performed the HPI with the patient and updated documentation appropriately.        Comments   6 week retina follow up CME OS pt is reporting no vision changes noticed she denies any flashes or floaters she is using  Brimonidine  TID OS, Cosopt  TID OS latanoprost  at bedtime OS PF BID OS          Last edited by Valdemar Rogue, MD on 01/19/2024  1:05 PM.    Pt states vision is the same  Referring physician: Seabron Lenis, MD [phone] [address] [place],  [place] [address]  HISTORICAL INFORMATION:   Selected notes from the MEDICAL RECORD NUMBER Referred by Dr. Nanetta Sharps for possible RD OS LEE: 03/16/02023 Ocular Hx-  PMH- HTN    CURRENT MEDICATIONS: Current Outpatient Medications (Ophthalmic Drugs)  Medication Sig   brimonidine  (ALPHAGAN ) 0.2 % ophthalmic solution INSTILL 1 DROP INTO LEFT EYE THREE TIMES DAILY   dorzolamide -timolol  (COSOPT ) 2-0.5 % ophthalmic solution Place 1 drop into the left eye 3 (three) times daily.   latanoprost  (XALATAN ) 0.005 % ophthalmic solution Place 1 drop into the left eye at bedtime.   loteprednol  (LOTEMAX ) 0.5 % ophthalmic suspension INSTILL 1 DROP  INTO LEFT EYE TWICE DAILY   Bromfenac  Sodium (PROLENSA ) 0.07 % SOLN Place 1 drop into the left eye daily. PT NEEDS 2 BOTTLES DISPENSED AT A TIME-CALL IF COUPON IS NEEDED   Bromfenac  Sodium 0.09 % SOLN Place 1 drop into the left eye 4 (four) times daily.   erythromycin  ophthalmic ointment Place 1 Application into the left eye at bedtime. (Patient not taking: Reported on 05/13/2023)   latanoprost  (XALATAN ) 0.005 % ophthalmic solution  Place 1 drop into the left eye at bedtime.   LOTEPREDNOL  ETABONATE OP Place 1 drop into the right eye 4 (four) times daily.   prednisoLONE  acetate (PRED FORTE ) 1 % ophthalmic suspension INSTILL 1 DROP INTO LEFT EYE 4 TIMES DAILY (Patient not taking: Reported on 05/13/2023)   prednisoLONE  acetate (PRED FORTE ) 1 % ophthalmic suspension Place 1 drop into the left eye 4 (four) times daily. (Patient not taking: Reported on 05/13/2023)   No current facility-administered medications for this visit. (Ophthalmic Drugs)   Current Outpatient Medications (Other)  Medication Sig   APPLE CIDER VINEGAR PO Take 2 tablets by mouth daily. gummies   Cholecalciferol (VITAMIN D3) 10 MCG (400 UNIT) tablet Take 400 Units by mouth daily.   COLLAGEN PO Take 3 capsules by mouth daily.   Denosumab  (PROLIA  [place]) Inject 60 mg into the skin every 6 (six) months.   fluticasone (FLONASE) 50 MCG/ACT nasal spray Place 1 spray into both nostrils daily.   furosemide (LASIX) 40 MG tablet Take 40 mg by mouth 2 (two) times daily.   Glucosamine HCl (GLUCOSAMINE PO) Take 2 tablets by mouth daily.   losartan (COZAAR) 50 MG tablet Take 100 mg by mouth daily.   Magnesium 400 MG CAPS Take 400 mg by mouth daily.   Melatonin 5 MG CAPS Take 10 mg by mouth at bedtime.  metformin (FORTAMET) 500 MG (OSM) 24 hr tablet Take 500 mg by mouth daily with breakfast.   pantoprazole (PROTONIX) 40 MG tablet Take 40 mg by mouth 2 (two) times daily.   rosuvastatin (CRESTOR) 20 MG tablet Take 20 mg by mouth daily.   Semaglutide -Weight Management (WEGOVY ) 2.4 MG/0.75ML SOAJ Inject 2.4 mg into the skin once a week.   TURMERIC PO Take 2 tablets by mouth daily.   CINNAMON PO Take 1,000 mg by mouth every other day.   FEROSUL 325 (65 Fe) MG tablet Take 325 mg by mouth daily.   fexofenadine (ALLEGRA) 180 MG tablet Take 180 mg by mouth daily.   metformin (FORTAMET) 500 MG (OSM) 24 hr tablet    Semaglutide -Weight Management (WEGOVY ) 1 MG/0.5ML SOAJ Inject 1 mg  into the skin once a week.   Semaglutide -Weight Management (WEGOVY ) 1 MG/0.5ML SOAJ Inject 1 mg into the skin once a week.   Semaglutide -Weight Management (WEGOVY ) 1.7 MG/0.75ML SOAJ Inject 1.7 mg under the skin once weekly   Semaglutide -Weight Management (WEGOVY ) 1.7 MG/0.75ML SOAJ Inject 1.7 mg into the skin once a week.   No current facility-administered medications for this visit. (Other)   REVIEW OF SYSTEMS: ROS   Positive for: Musculoskeletal, Cardiovascular, Eyes, Respiratory Negative for: Constitutional, Gastrointestinal, Neurological, Skin, Genitourinary, HENT, Endocrine, Psychiatric, Allergic/Imm, Heme/Lymph Last edited by Resa Delon ORN, COT on 01/19/2024 12:42 PM.      ALLERGIES Allergies  Allergen Reactions   Codeine Itching   PAST MEDICAL HISTORY Past Medical History:  Diagnosis Date   Allergy    Arthritis    legs   Asthma    GERD (gastroesophageal reflux disease)    Hyperlipidemia    Hypertension    Past Surgical History:  Procedure Laterality Date   ABDOMINAL HYSTERECTOMY     CHOLECYSTECTOMY     COLONOSCOPY     GAS INSERTION Left 09/05/2021   Procedure: INSERTION OF GAS - C3F8;  Surgeon: Valdemar Rogue, MD;  Location: [facility] OR;  Service: Ophthalmology;  Laterality: Left;   GAS/FLUID EXCHANGE Left 09/05/2021   Procedure: GAS/FLUID EXCHANGE;  Surgeon: Valdemar Rogue, MD;  Location: [facility] OR;  Service: Ophthalmology;  Laterality: Left;   PARS PLANA VITRECTOMY Left 09/05/2021   Procedure: PARS PLANA VITRECTOMY WITH 25 GAUGE;  Surgeon: Valdemar Rogue, MD;  Location: [facility] OR;  Service: Ophthalmology;  Laterality: Left;   PERFLUORONE INJECTION Left 09/05/2021   Procedure: PERFLUORON INJECTION;  Surgeon: Valdemar Rogue, MD;  Location: [facility] OR;  Service: Ophthalmology;  Laterality: Left;   PHOTOCOAGULATION WITH LASER Left 09/05/2021   Procedure: PHOTOCOAGULATION WITH LASER;  Surgeon: Valdemar Rogue, MD;  Location: [facility] OR;  Service: Ophthalmology;  Laterality: Left;   SCLERAL  BUCKLE Left 09/05/2021   Procedure: SCLERAL BUCKLE;  Surgeon: Valdemar Rogue, MD;  Location: [facility] OR;  Service: Ophthalmology;  Laterality: Left;   UPPER GASTROINTESTINAL ENDOSCOPY     FAMILY HISTORY Family History  Problem Relation Age of Onset   Retinal detachment Sister    Diabetes Sister    Glaucoma Brother    Diabetes Brother    Colon cancer Brother        dx at 52   Esophageal cancer Neg Hx    Rectal cancer Neg Hx    Stomach cancer Neg Hx    SOCIAL HISTORY Social History   Tobacco Use   Smoking status: Former   Smokeless tobacco: Never   Tobacco comments:    quit smoking in 2002 or 2003  Vaping Use   Vaping status:  Never Used  Substance Use Topics   Alcohol use: Yes    Comment: very rarely   Drug use: No       OPHTHALMIC EXAM:  Base Eye Exam     Visual Acuity (Snellen - Linear)       Right Left   Dist [place] 20/20 -2 20/350   Dist ph [place]  NI         Tonometry (Tonopen, 12:48 PM)       Right Left   Pressure 16 29  1  gtts brim dorz        Pupils       Pupils Dark Light Shape React APD   Right PERRL 3 2 Round Brisk None   Left PERRL 3 2 Round Brisk None         Visual Fields       Left Right    Full Full         Extraocular Movement       Right Left    Full, Ortho Full, Ortho         Neuro/Psych     Oriented x3: Yes   Mood/Affect: Normal         Dilation     Both eyes: 2.5% Phenylephrine  @ 12:51 PM           Slit Lamp and Fundus Exam     External Exam       Right Left   External  Periorbital edema         Slit Lamp Exam       Right Left   Lids/Lashes Dermatochalasis - upper lid Dermatochalasis - upper lid, periorbital erythema   Conjunctiva/Sclera white and quiet White and quiet   Cornea trace PEE, mild tear film debris, well healed cataract wound trace PEE, mild tear film debris, well healed cataract wound, inferior paracentral corneal haze   Anterior Chamber deep and clear deep and clear   Iris round and  dilated round and dilated   Lens PC IOL in good position PC IOL in good position   Anterior Vitreous syneresis, mild asteroid hyalosis, PVD post vitrectomy, clear -- no residual kenalog          Fundus Exam       Right Left   Disc Pink and Sharp mild Pallor, Sharp rim, Compact   C/D Ratio 0.4 0.3   Macula flat, good foveal reflex, mild RPE mottling, No heme or edema Flat, Blunted foveal reflex, RPE mottling, ERM with striae greatest nasal mac, diffuse edema/cystic changes centrally and nasally, shallow SRF, No heme   Vessels attenuated, Tortuous attenuated, Tortuous   Periphery Attached, operculated hole @ 1030 with mild surrounding pigment -- good laser surrounding, no SRF, no new RT/RD Attached, Trace pockets of SRF superior to disc with fibrosis, focal fibrosis at 0600 posterior to buckle, Retina attached over buckle, good buckle height, good laser over buckle and around tears, PRE-OP: small tear with fibrosis @ 0200, bullous, temporal RD from 1230 to 0430           IMAGING AND PROCEDURES  Imaging and Procedures for 01/19/2024  OCT, Retina - OU - Both Eyes       Right Eye Quality was good. Central Foveal Thickness: 284. Progression has been stable. Findings include normal foveal contour, no IRF, no SRF.   Left Eye Quality was good. Central Foveal Thickness: 715. Progression has been stable. Findings include abnormal foveal contour, epiretinal membrane, intraretinal fluid, macular pucker, subretinal fluid (  ERM with blunted foveal contour and mild pucker, persistent diffuse edema w/ +IRF, SRF ).   Notes *Images captured and stored on drive  Diagnosis / Impression:  OD: NFP; no IRF/SRF OS: ERM with blunted foveal contour and mild pucker, persistent diffuse edema w/ +IRF, SRF   Clinical management:  See below  Abbreviations: NFP - Normal foveal profile. CME - cystoid macular edema. PED - pigment epithelial detachment. IRF - intraretinal fluid. SRF - subretinal fluid. EZ -  ellipsoid zone. ERM - epiretinal membrane. ORA - outer retinal atrophy. ORT - outer retinal tubulation. SRHM - subretinal hyper-reflective material. IRHM - intraretinal hyper-reflective material     (08.05.2025  01:02 pm) Suprachoroidal injection of XIPERE  administered to Left eye per protocol. Anesthesia: Topical/Subconjunctival lidocaine . Injection site: superior temporal quadrant, 4-4.78mm posterior to limbus. 1100 m needle used. No resistance encountered upon entering suprachoroidal space. Slow injection over 10 seconds, followed by 5 seconds of pressure. IOP checked post-injection, within normal limits. Patient educated on potential side effects (e.g., subconjunctival hemorrhage) and to report any signs of infection (e.g., increased pain, redness, blurry vision) or retinal detachment. Follow-up appointment scheduled for 6 wks. Tolerated procedure well.       ASSESSMENT/PLAN:    ICD-10-CM   1. Left retinal detachment  H33.22 OCT, Retina - OU - Both Eyes    2. Cystoid macular edema of left eye  H35.352 OCT, Retina - OU - Both Eyes    3. Epiretinal membrane (ERM) of left eye  H35.372     4. Retinal tear of right eye  H33.311     5. Essential hypertension  I10     6. Hypertensive retinopathy of both eyes  H35.033     7. 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 Rhegmatogenous retinal detachment with retinal hole, left eye - bullous, temporal, mac off detachment, onset of foveal involvement Tuesday, 03.14.23 by history - detached from 1230 to 430 oclock, fovea off, small tear at 0200 - s/p SBP + PPV/PFO/EL/FAX/14% C3F8 OS, 03.23.2023 - retina attached and in good position -- good buckle height and laser around breaks  - FA 12.18.23 shows OD Focal staining ST periphery corresponding to laser retinopexy, OS Mild Hyperfluorescence of disc, mild perifoveal staining SN to fovea--?petaloid -- suggestive of CME - OCT shows ERM with blunted  foveal contour and mild pucker, persistent diffuse edema w/ +IRF, SRF -- slightly improved - s/p STK #1 (11.20.23) for CME, #2 (02.28.24), #3 (07.03.24), #4 (07.03.25) - s/p IVA OS #1 (05.01.24), #2 (05.29.24), #3 (06.26.24) -- IVA resistance  - s/p IVE OS #1 (07.24.24), #2 (08.21.24), #3 (09.18.24), #4 (10.16.24)  - s/p IVK OS #1 (11.13.24), #2 (12.11.24), #3 (01.08.25), #4 (05.06.25), #5 (06.03.25)  - s/p IVV OS #1 (02.05.25 -- sample) #2 (sample -- 03.10.25), #3 (sample -- 04.07.25)  **h/o good CME response to IVK, but poor steroid/IOP response to IVK** - IOP OS 29 today -- h/o steroid response - BCVA OS 20/350 from 20/250 - continue holding Prolensa  (h/o medicamentosa)  - continue holding PF (h/o steroid response) -- cont Lotemax  BID OS  - continue Brimonidine  TID OS, Cosopt  TID OS - cont Latanoprost  at bedtime OS.  - recommend XIPERE  injection OS #1 [sample] today, 08.05.25 - pt wishes to proceed with injection - RBA of procedure discussed, questions answered - Xipere  informed consent obtained and signed, 08.05.25 - see procedure note - IVK informed  consent obtained and signed, 11.13.24 - IVE informed consent obtained and signed, 07.24.24 (OS) - IVV informed consent obtained and signed, 02.05.25 - Eylea  authorized for 2025 -- [employer] funding unavailable at this time - f/u 6 weeks - DFE, OCT, possible injection   3. Epiretinal membrane, left eye  - ERM with persistent cystic edema - OCT shows persistent ERM w/ cystic changes slightly improved - monitor for now, but discussed possibility of repeat PPV w/ membrane peel to remove ERM if vision fails to improve  4. Retinal hole OD  - operculated retinal hole located at 1030 with mild surrounding pigment, no SRF - s/p laser retinopexy OD (03.16.23) -- good laser surrounding - monitor  5,6. Hypertensive retinopathy OU - discussed importance of tight BP control - monitor  7. Pseudophakia OU  - s/p CE/IOL OS (Dr. Fleeta, 10.04.23),  CE with IOL OD (11.01.23, Dr. Fleeta)  - IOL in good position, doing well  - monitor  8. Ocular hypertension OS  - h/o steroid response  - tmax 33 on 01.29.24  - s/p SLT OS w/ Dr. Austin on 12.05.23  - IOP today: 29  - continue Brimonidine  TID OS, Cosopt  TID OS - continue latanoprost  at bedtime OS  Ophthalmic Meds Ordered this visit:  No orders of the defined types were placed in this encounter.    Return in about 6 weeks (around 03/01/2024) for CME OS, Dilated Exam, OCT, Possible Injxn.  There are no Patient Instructions on file for this visit.  This document serves as a record of services personally performed by Redell JUDITHANN Hans, MD, PhD. It was created on their behalf by Avelina Pereyra, COA an ophthalmic technician. The creation of this record is the provider's dictation and/or activities during the visit.   Electronically signed by: Avelina GORMAN Pereyra, COT  01/19/24  9:57 PM   This document serves as a record of services personally performed by Redell JUDITHANN Hans, MD, PhD. It was created on their behalf by Alan PARAS. Delores, OA an ophthalmic technician. The creation of this record is the provider's dictation and/or activities during the visit.    Electronically signed by: Alan PARAS. Delores, OA 01/19/24 9:57 PM  Redell JUDITHANN Hans, M.D., Ph.D. Diseases & Surgery of the Retina and Vitreous [facility]  I have reviewed the above documentation for accuracy and completeness, and I agree with the above. Redell JUDITHANN Hans, M.D., Ph.D. 01/19/24 9:57 PM   Abbreviations: M myopia (nearsighted); A astigmatism; H hyperopia (farsighted); P presbyopia; Mrx spectacle prescription;  CTL contact lenses; OD right eye; OS left eye; OU both eyes  XT exotropia; ET esotropia; PEK punctate epithelial keratitis; PEE punctate epithelial erosions; DES dry eye syndrome; MGD meibomian gland dysfunction; ATs artificial tears; PFAT's preservative free artificial tears; NSC nuclear sclerotic cataract; PSC  posterior subcapsular cataract; ERM epi-retinal membrane; PVD posterior vitreous detachment; RD retinal detachment; DM diabetes mellitus; DR diabetic retinopathy; NPDR non-proliferative diabetic retinopathy; PDR proliferative diabetic retinopathy; CSME clinically significant macular edema; DME diabetic macular edema; dbh dot blot hemorrhages; CWS cotton wool spot; POAG primary open angle glaucoma; C/D cup-to-disc ratio; HVF humphrey visual field; GVF goldmann visual field; OCT optical coherence tomography; IOP intraocular pressure; BRVO Branch retinal vein occlusion; CRVO central retinal vein occlusion; CRAO central retinal artery occlusion; BRAO branch retinal artery occlusion; RT retinal tear; SB scleral buckle; PPV pars plana vitrectomy; VH Vitreous hemorrhage; PRP panretinal laser photocoagulation; IVK intravitreal kenalog ; VMT vitreomacular traction; MH Macular hole;  NVD neovascularization  of the disc; NVE neovascularization elsewhere; AREDS age related eye disease study; ARMD age related macular degeneration; POAG primary open angle glaucoma; EBMD epithelial/anterior basement membrane dystrophy; ACIOL anterior chamber intraocular lens; IOL intraocular lens; PCIOL posterior chamber intraocular lens; Phaco/IOL phacoemulsification with intraocular lens placement; PRK photorefractive keratectomy; LASIK laser assisted in situ keratomileusis; HTN hypertension; DM diabetes mellitus; COPD chronic obstructive pulmonary disease

## 2024-01-12 ENCOUNTER — Other Ambulatory Visit (HOSPITAL_BASED_OUTPATIENT_CLINIC_OR_DEPARTMENT_OTHER): Payer: Self-pay

## 2024-01-12 MED ORDER — WEGOVY 2.4 MG/0.75ML ~~LOC~~ SOAJ
2.4000 mg | SUBCUTANEOUS | 0 refills | Status: DC
  Filled 2024-01-12 – 2024-03-07 (×3): qty 9, 84d supply, fill #0

## 2024-01-13 ENCOUNTER — Other Ambulatory Visit (HOSPITAL_COMMUNITY): Payer: Self-pay

## 2024-01-14 DIAGNOSIS — M810 Age-related osteoporosis without current pathological fracture: Secondary | ICD-10-CM | POA: Diagnosis not present

## 2024-01-14 DIAGNOSIS — M1990 Unspecified osteoarthritis, unspecified site: Secondary | ICD-10-CM | POA: Diagnosis not present

## 2024-01-14 DIAGNOSIS — J45909 Unspecified asthma, uncomplicated: Secondary | ICD-10-CM | POA: Diagnosis not present

## 2024-01-14 DIAGNOSIS — E785 Hyperlipidemia, unspecified: Secondary | ICD-10-CM | POA: Diagnosis not present

## 2024-01-15 ENCOUNTER — Other Ambulatory Visit: Payer: Self-pay | Admitting: Medical Genetics

## 2024-01-18 ENCOUNTER — Other Ambulatory Visit (HOSPITAL_COMMUNITY)
Admission: RE | Admit: 2024-01-18 | Discharge: 2024-01-18 | Disposition: A | Discharge status: Home / Self Care | Payer: Self-pay | Source: Ambulatory Visit | Attending: Medical Genetics | Admitting: Medical Genetics

## 2024-01-19 ENCOUNTER — Encounter (INDEPENDENT_AMBULATORY_CARE_PROVIDER_SITE_OTHER): Payer: Self-pay | Admitting: Ophthalmology

## 2024-01-19 ENCOUNTER — Ambulatory Visit (INDEPENDENT_AMBULATORY_CARE_PROVIDER_SITE_OTHER): Admitting: Ophthalmology

## 2024-01-19 DIAGNOSIS — H35372 Puckering of macula, left eye: Secondary | ICD-10-CM

## 2024-01-19 DIAGNOSIS — Z961 Presence of intraocular lens: Secondary | ICD-10-CM

## 2024-01-19 DIAGNOSIS — H35352 Cystoid macular degeneration, left eye: Secondary | ICD-10-CM | POA: Diagnosis not present

## 2024-01-19 DIAGNOSIS — H3322 Serous retinal detachment, left eye: Secondary | ICD-10-CM | POA: Diagnosis not present

## 2024-01-19 DIAGNOSIS — I10 Essential (primary) hypertension: Secondary | ICD-10-CM

## 2024-01-19 DIAGNOSIS — H35033 Hypertensive retinopathy, bilateral: Secondary | ICD-10-CM | POA: Diagnosis not present

## 2024-01-19 DIAGNOSIS — H33311 Horseshoe tear of retina without detachment, right eye: Secondary | ICD-10-CM

## 2024-01-19 DIAGNOSIS — H40052 Ocular hypertension, left eye: Secondary | ICD-10-CM

## 2024-01-26 LAB — GENECONNECT MOLECULAR SCREEN: Genetic Analysis Overall Interpretation: NEGATIVE

## 2024-02-14 DIAGNOSIS — E785 Hyperlipidemia, unspecified: Secondary | ICD-10-CM | POA: Diagnosis not present

## 2024-02-14 DIAGNOSIS — M1990 Unspecified osteoarthritis, unspecified site: Secondary | ICD-10-CM | POA: Diagnosis not present

## 2024-02-14 DIAGNOSIS — J45909 Unspecified asthma, uncomplicated: Secondary | ICD-10-CM | POA: Diagnosis not present

## 2024-02-14 DIAGNOSIS — M810 Age-related osteoporosis without current pathological fracture: Secondary | ICD-10-CM | POA: Diagnosis not present

## 2024-02-24 NOTE — Progress Notes (Addendum)
 [facility] - Clinic Note  03/02/2024    CHIEF COMPLAINT Patient presents for Retina Follow Up   HISTORY OF PRESENT ILLNESS: Alicia Miranda is a 68 y.o. female who presents to the clinic today for:   HPI     Retina Follow Up   In left eye.  This started 6 weeks ago.  Duration of 6 weeks.  Since onset it is stable.  I, the attending physician,  performed the HPI with the patient and updated documentation appropriately.        Comments   6 week retina follow up CME OS pt is reporting that she had eye pain after having  Xipere  injection she is using Brimonidine  TID OS, Cosopt  TID OS  latanoprost  at bedtime OS       Last edited by Valdemar Rogue, MD on 03/12/2024 12:30 PM.     Pt states vision improved for about a month and then retutned blurry. She has been dealing with sinus issues.   Referring physician: Seabron Lenis, MD [address] [place],  [place] [address]  HISTORICAL INFORMATION:   Selected notes from the MEDICAL RECORD NUMBER Referred by Dr. Nanetta Sharps for possible RD OS LEE: 03/16/02023 Ocular Hx-  PMH- HTN    CURRENT MEDICATIONS: Current Outpatient Medications (Ophthalmic Drugs)  Medication Sig   brimonidine  (ALPHAGAN ) 0.2 % ophthalmic solution INSTILL 1 DROP INTO LEFT EYE THREE TIMES DAILY   dorzolamide -timolol  (COSOPT ) 2-0.5 % ophthalmic solution Place 1 drop into the left eye 3 (three) times daily.   latanoprost  (XALATAN ) 0.005 % ophthalmic solution Place 1 drop into the left eye at bedtime.   loteprednol  (LOTEMAX ) 0.5 % ophthalmic suspension INSTILL 1 DROP  INTO LEFT EYE TWICE DAILY   Bromfenac  Sodium (PROLENSA ) 0.07 % SOLN Place 1 drop into the left eye daily. PT NEEDS 2 BOTTLES DISPENSED AT A TIME-CALL IF COUPON IS NEEDED   Bromfenac  Sodium 0.09 % SOLN Place 1 drop into the left eye 4 (four) times daily.   erythromycin  ophthalmic ointment Place 1 Application into the left eye at bedtime. (Patient not taking: Reported on  05/13/2023)   latanoprost  (XALATAN ) 0.005 % ophthalmic solution Place 1 drop into the left eye at bedtime.   LOTEPREDNOL  ETABONATE OP Place 1 drop into the right eye 4 (four) times daily.   prednisoLONE  acetate (PRED FORTE ) 1 % ophthalmic suspension INSTILL 1 DROP INTO LEFT EYE 4 TIMES DAILY (Patient not taking: Reported on 05/13/2023)   prednisoLONE  acetate (PRED FORTE ) 1 % ophthalmic suspension Place 1 drop into the left eye 4 (four) times daily. (Patient not taking: Reported on 05/13/2023)   No current facility-administered medications for this visit. (Ophthalmic Drugs)   Current Outpatient Medications (Other)  Medication Sig   APPLE CIDER VINEGAR PO Take 2 tablets by mouth daily. gummies   Cholecalciferol (VITAMIN D3) 10 MCG (400 UNIT) tablet Take 400 Units by mouth daily.   COLLAGEN PO Take 3 capsules by mouth daily.   Denosumab  (PROLIA  [place]) Inject 60 mg into the skin every 6 (six) months.   fluticasone (FLONASE) 50 MCG/ACT nasal spray Place 1 spray into both nostrils daily.   furosemide (LASIX) 40 MG tablet Take 40 mg by mouth 2 (two) times daily.   Glucosamine HCl (GLUCOSAMINE PO) Take 2 tablets by mouth daily.   losartan (COZAAR) 50 MG tablet Take 100 mg by mouth daily.   Magnesium 400 MG CAPS Take 400 mg by mouth daily.   Melatonin 5 MG  CAPS Take 10 mg by mouth at bedtime.   metformin (FORTAMET) 500 MG (OSM) 24 hr tablet Take 500 mg by mouth daily with breakfast.   pantoprazole (PROTONIX) 40 MG tablet Take 40 mg by mouth 2 (two) times daily.   rosuvastatin (CRESTOR) 20 MG tablet Take 20 mg by mouth daily.   semaglutide -weight management (WEGOVY ) 2.4 MG/0.75ML SOAJ SQ injection Inject 2.4 mg into the skin once a week.   TURMERIC PO Take 2 tablets by mouth daily.   CINNAMON PO Take 1,000 mg by mouth every other day.   FEROSUL 325 (65 Fe) MG tablet Take 325 mg by mouth daily.   fexofenadine (ALLEGRA) 180 MG tablet Take 180 mg by mouth daily.   metformin (FORTAMET) 500 MG (OSM) 24 hr  tablet    Semaglutide -Weight Management (WEGOVY ) 1 MG/0.5ML SOAJ Inject 1 mg into the skin once a week.   Semaglutide -Weight Management (WEGOVY ) 1 MG/0.5ML SOAJ Inject 1 mg into the skin once a week.   Semaglutide -Weight Management (WEGOVY ) 1.7 MG/0.75ML SOAJ Inject 1.7 mg under the skin once weekly   Semaglutide -Weight Management (WEGOVY ) 1.7 MG/0.75ML SOAJ Inject 1.7 mg into the skin once a week.   No current facility-administered medications for this visit. (Other)   REVIEW OF SYSTEMS: ROS   Positive for: Musculoskeletal, Cardiovascular, Eyes, Respiratory Negative for: Constitutional, Gastrointestinal, Neurological, Skin, Genitourinary, HENT, Endocrine, Psychiatric, Allergic/Imm, Heme/Lymph Last edited by Resa Delon ORN, COT on 03/02/2024 12:53 PM.       ALLERGIES Allergies  Allergen Reactions   Codeine Itching   PAST MEDICAL HISTORY Past Medical History:  Diagnosis Date   Allergy    Arthritis    legs   Asthma    GERD (gastroesophageal reflux disease)    Hyperlipidemia    Hypertension    Past Surgical History:  Procedure Laterality Date   ABDOMINAL HYSTERECTOMY     CHOLECYSTECTOMY     COLONOSCOPY     GAS INSERTION Left 09/05/2021   Procedure: INSERTION OF GAS - C3F8;  Surgeon: Valdemar Rogue, MD;  Location: [facility] OR;  Service: Ophthalmology;  Laterality: Left;   GAS/FLUID EXCHANGE Left 09/05/2021   Procedure: GAS/FLUID EXCHANGE;  Surgeon: Valdemar Rogue, MD;  Location: [facility] OR;  Service: Ophthalmology;  Laterality: Left;   PARS PLANA VITRECTOMY Left 09/05/2021   Procedure: PARS PLANA VITRECTOMY WITH 25 GAUGE;  Surgeon: Valdemar Rogue, MD;  Location: [facility] OR;  Service: Ophthalmology;  Laterality: Left;   PERFLUORONE INJECTION Left 09/05/2021   Procedure: PERFLUORON INJECTION;  Surgeon: Valdemar Rogue, MD;  Location: [facility] OR;  Service: Ophthalmology;  Laterality: Left;   PHOTOCOAGULATION WITH LASER Left 09/05/2021   Procedure: PHOTOCOAGULATION WITH LASER;  Surgeon: Valdemar Rogue,  MD;  Location: [facility] OR;  Service: Ophthalmology;  Laterality: Left;   SCLERAL BUCKLE Left 09/05/2021   Procedure: SCLERAL BUCKLE;  Surgeon: Valdemar Rogue, MD;  Location: [facility] OR;  Service: Ophthalmology;  Laterality: Left;   UPPER GASTROINTESTINAL ENDOSCOPY     FAMILY HISTORY Family History  Problem Relation Age of Onset   Retinal detachment Sister    Diabetes Sister    Glaucoma Brother    Diabetes Brother    Colon cancer Brother        dx at 54   Esophageal cancer Neg Hx    Rectal cancer Neg Hx    Stomach cancer Neg Hx    SOCIAL HISTORY Social History   Tobacco Use   Smoking status: Former   Smokeless tobacco: Never   Tobacco comments:  quit smoking in 2002 or 2003  Vaping Use   Vaping status: Never Used  Substance Use Topics   Alcohol use: Yes    Comment: very rarely   Drug use: No       OPHTHALMIC EXAM:  Base Eye Exam     Visual Acuity (Snellen - Linear)       Right Left   Dist [place] 20/25 -2 20/350   Dist ph [place] NI [id number]         Tonometry (Tonopen, 12:57 PM)       Right Left   Pressure 9 27  1  gtts brom/dorz        Pupils       Pupils Dark Light Shape React APD   Right PERRL 3 2 Round Brisk None   Left PERRL 3 2 Round Brisk None         Visual Fields       Left Right    Full Full         Extraocular Movement       Right Left    Full, Ortho Full, Ortho         Neuro/Psych     Oriented x3: Yes   Mood/Affect: Normal         Dilation     Both eyes: 2.5% Phenylephrine  @ 12:58 PM           Slit Lamp and Fundus Exam     External Exam       Right Left   External  Periorbital edema         Slit Lamp Exam       Right Left   Lids/Lashes Dermatochalasis - upper lid Dermatochalasis - upper lid, periorbital erythema   Conjunctiva/Sclera white and quiet White and quiet   Cornea trace PEE, mild tear film debris, well healed cataract wound mild tear film debris, well healed cataract wound, inferior paracentral corneal  haze, 1+ Punctate epithelial erosions   Anterior Chamber deep and clear deep and clear, No cells or flare   Iris round and dilated round and dilated   Lens PC IOL in good position PC IOL in good position   Anterior Vitreous syneresis, mild asteroid hyalosis, PVD post vitrectomy, clear -- no residual kenalog          Fundus Exam       Right Left   Disc Pink and Sharp mild Pallor, Sharp rim, Compact   C/D Ratio 0.4 0.3   Macula flat, good foveal reflex, mild RPE mottling, No heme or edema Flat, Blunted foveal reflex, RPE mottling, ERM with striae greatest nasal mac, diffuse edema/cystic changes centrally and nasally, shallow SRF, No heme   Vessels attenuated, Tortuous attenuated, Tortuous   Periphery Attached, operculated hole @ 1030 with mild surrounding pigment -- good laser surrounding, no SRF, no new RT/RD Attached, Trace pockets of SRF superior to disc with fibrosis, focal fibrosis at 0600 posterior to buckle, Retina attached over buckle, good buckle height, good laser over buckle and around tears, PRE-OP: small tear with fibrosis @ 0200, bullous, temporal RD from 1230 to 0430           IMAGING AND PROCEDURES  Imaging and Procedures for 03/02/2024  OCT, Retina - OU - Both Eyes       Right Eye Quality was good. Central Foveal Thickness: 281. Progression has worsened. Findings include normal foveal contour, no SRF (Focal cyst superior fovea).   Left Eye Quality was good. Central  Foveal Thickness: 761. Progression has been stable. Findings include abnormal foveal contour, epiretinal membrane, intraretinal fluid, macular pucker, subretinal fluid (ERM with blunted foveal contour and mild pucker, persistent diffuse edema w/ +IRF, SRF -- slightly increased).   Notes *Images captured and stored on drive  Diagnosis / Impression:  OD: NFP; no IRF/SRF, Focal cyst superior fovea OS: ERM with blunted foveal contour and mild pucker, persistent diffuse edema w/ +IRF, SRF   Clinical  management:  See below  Abbreviations: NFP - Normal foveal profile. CME - cystoid macular edema. PED - pigment epithelial detachment. IRF - intraretinal fluid. SRF - subretinal fluid. EZ - ellipsoid zone. ERM - epiretinal membrane. ORA - outer retinal atrophy. ORT - outer retinal tubulation. SRHM - subretinal hyper-reflective material. IRHM - intraretinal hyper-reflective material            ASSESSMENT/PLAN:    ICD-10-CM   1. Left retinal detachment  H33.22     2. Cystoid macular edema of left eye  H35.352 OCT, Retina - OU - Both Eyes    3. Epiretinal membrane (ERM) of left eye  H35.372     4. Retinal tear of right eye  H33.311     5. Essential hypertension  I10     6. Hypertensive retinopathy of both eyes  H35.033     7. 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 Rhegmatogenous retinal detachment with retinal hole, left eye - bullous, temporal, mac off detachment, onset of foveal involvement Tuesday, 03.14.23 by history - detached from 1230 to 430 oclock, fovea off, small tear at 0200 - s/p SBP + PPV/PFO/EL/FAX/14% C3F8 OS, 03.23.2023 - retina attached and in good position -- good buckle height and laser around breaks - FA 12.18.23 shows OD Focal staining ST periphery corresponding to laser retinopexy, OS Mild Hyperfluorescence of disc, mild perifoveal staining SN to fovea--?petaloid -- suggestive of CME - s/p STK #1 (11.20.23) for CME, #2 (02.28.24), #3 (07.03.24), #4 (07.03.25) =============== - s/p IVA OS #1 (05.01.24), #2 (05.29.24), #3 (06.26.24) -- IVA resistance =============== - s/p IVE OS #1 (07.24.24), #2 (08.21.24), #3 (09.18.24), #4 (10.16.24) =============== - s/p IVK OS #1 (11.13.24), #2 (12.11.24), #3 (01.08.25), #4 (05.06.25), #5 (06.03.25) =============== - s/p IVV OS #1 (02.05.25 -- sample) #2 (sample -- 03.10.25), #3 (sample -- 04.07.25) ================  - s/p Xipere  inj OS #1  (sample--08.05.25) **h/o good CME response to IVK, but poor steroid/IOP response to IVK** - IOP OS 27 today -- h/o steroid response - BCVA OS 20/350 from 20/250 - OCT shows OS: ERM with blunted foveal contour and mild pucker, persistent diffuse edema w/ +IRF, SRF at 6 wks post-Xipere   - ?good initial response to Xipere  -- pt reports vision improved after injection but then improvement wore off - continue holding Prolensa  (h/o medicamentosa)  - continue holding PF (h/o steroid response) -- cont Lotemax  QD OS - continue Brimonidine  TID OS, Cosopt  TID OS, and Latanoprost  at bedtime OS.  - recommend holding tx today w/ f/u in 6 weeks - Xipere  informed consent obtained and signed, 08.05.25 - IVK informed consent obtained and signed, 11.13.24 - IVE informed consent obtained and signed, 07.24.24 (OS) - IVV informed consent obtained and signed, 02.05.25 - Eylea  authorized for 2025 -- [employer] funding unavailable at this time - f/u 6 weeks - DFE, OCT, possible injection   3. Epiretinal membrane, left eye  - ERM with persistent  cystic edema - OCT shows persistent ERM w/ cystic changes slightly improved - monitor for now, but discussed possibility of repeat PPV w/ membrane peel to remove ERM if vision fails to improve  4. Retinal hole OD  - operculated retinal hole located at 1030 with mild surrounding pigment, no SRF - s/p laser retinopexy OD (03.16.23) -- good laser surrounding - monitor  5,6. Hypertensive retinopathy OU - discussed importance of tight BP control - monitor  7. Pseudophakia OU - s/p CE/IOL OS (Dr. Fleeta, 10.04.23), CE with IOL OD (11.01.23, Dr. Fleeta)  - IOL in good position, doing well  - monitor  8. Ocular hypertension OS  - h/o steroid response  - tmax 33 on 01.29.24  - s/p SLT OS w/ Dr. Austin on 12.05.23  - IOP today: 27 - continue Brimonidine  TID OS, Cosopt  TID OS, latanoprost  at bedtime OS  Ophthalmic Meds Ordered this visit:  No orders of the defined types were  placed in this encounter.    Return in about 6 weeks (around 04/13/2024) for f/u, RD, DFE, OCT.  There are no Patient Instructions on file for this visit.  This document serves as a record of services personally performed by Redell JUDITHANN Hans, MD, PhD. It was created on their behalf by Almetta Pesa, an ophthalmic technician. The creation of this record is the provider's dictation and/or activities during the visit.    Electronically signed by: Almetta Pesa, OA, 03/12/24  1:25 PM  This document serves as a record of services personally performed by Redell JUDITHANN Hans, MD, PhD. It was created on their behalf by Wanda GEANNIE Keens, COT an ophthalmic technician. The creation of this record is the provider's dictation and/or activities during the visit.    Electronically signed by:  Wanda GEANNIE Keens, COT  03/12/24 1:25 PM  Redell JUDITHANN Hans, M.D., Ph.D. Diseases & Surgery of the Retina and Vitreous [facility]  I have reviewed the above documentation for accuracy and completeness, and I agree with the above. Redell JUDITHANN Hans, M.D., Ph.D. 03/12/24 1:28 PM    Abbreviations: M myopia (nearsighted); A astigmatism; H hyperopia (farsighted); P presbyopia; Mrx spectacle prescription;  CTL contact lenses; OD right eye; OS left eye; OU both eyes  XT exotropia; ET esotropia; PEK punctate epithelial keratitis; PEE punctate epithelial erosions; DES dry eye syndrome; MGD meibomian gland dysfunction; ATs artificial tears; PFAT's preservative free artificial tears; NSC nuclear sclerotic cataract; PSC posterior subcapsular cataract; ERM epi-retinal membrane; PVD posterior vitreous detachment; RD retinal detachment; DM diabetes mellitus; DR diabetic retinopathy; NPDR non-proliferative diabetic retinopathy; PDR proliferative diabetic retinopathy; CSME clinically significant macular edema; DME diabetic macular edema; dbh dot blot hemorrhages; CWS cotton wool spot; POAG primary open angle  glaucoma; C/D cup-to-disc ratio; HVF humphrey visual field; GVF goldmann visual field; OCT optical coherence tomography; IOP intraocular pressure; BRVO Branch retinal vein occlusion; CRVO central retinal vein occlusion; CRAO central retinal artery occlusion; BRAO branch retinal artery occlusion; RT retinal tear; SB scleral buckle; PPV pars plana vitrectomy; VH Vitreous hemorrhage; PRP panretinal laser photocoagulation; IVK intravitreal kenalog ; VMT vitreomacular traction; MH Macular hole;  NVD neovascularization of the disc; NVE neovascularization elsewhere; AREDS age related eye disease study; ARMD age related macular degeneration; POAG primary open angle glaucoma; EBMD epithelial/anterior basement membrane dystrophy; ACIOL anterior chamber intraocular lens; IOL intraocular lens; PCIOL posterior chamber intraocular lens; Phaco/IOL phacoemulsification with intraocular lens placement; PRK photorefractive keratectomy; LASIK laser assisted in situ keratomileusis; HTN hypertension; DM diabetes mellitus; COPD chronic obstructive  pulmonary disease

## 2024-02-29 ENCOUNTER — Other Ambulatory Visit (HOSPITAL_COMMUNITY): Payer: Self-pay

## 2024-03-02 ENCOUNTER — Encounter (INDEPENDENT_AMBULATORY_CARE_PROVIDER_SITE_OTHER): Payer: Self-pay | Admitting: Ophthalmology

## 2024-03-02 ENCOUNTER — Ambulatory Visit (INDEPENDENT_AMBULATORY_CARE_PROVIDER_SITE_OTHER): Admitting: Ophthalmology

## 2024-03-02 DIAGNOSIS — H3322 Serous retinal detachment, left eye: Secondary | ICD-10-CM

## 2024-03-02 DIAGNOSIS — H35033 Hypertensive retinopathy, bilateral: Secondary | ICD-10-CM

## 2024-03-02 DIAGNOSIS — H35352 Cystoid macular degeneration, left eye: Secondary | ICD-10-CM | POA: Diagnosis not present

## 2024-03-02 DIAGNOSIS — H40052 Ocular hypertension, left eye: Secondary | ICD-10-CM

## 2024-03-02 DIAGNOSIS — H33311 Horseshoe tear of retina without detachment, right eye: Secondary | ICD-10-CM | POA: Diagnosis not present

## 2024-03-02 DIAGNOSIS — Z961 Presence of intraocular lens: Secondary | ICD-10-CM | POA: Diagnosis not present

## 2024-03-02 DIAGNOSIS — H35372 Puckering of macula, left eye: Secondary | ICD-10-CM

## 2024-03-02 DIAGNOSIS — I10 Essential (primary) hypertension: Secondary | ICD-10-CM

## 2024-03-07 ENCOUNTER — Other Ambulatory Visit (HOSPITAL_COMMUNITY): Payer: Self-pay

## 2024-03-12 ENCOUNTER — Encounter (INDEPENDENT_AMBULATORY_CARE_PROVIDER_SITE_OTHER): Payer: Self-pay | Admitting: Ophthalmology

## 2024-03-15 DIAGNOSIS — E785 Hyperlipidemia, unspecified: Secondary | ICD-10-CM | POA: Diagnosis not present

## 2024-03-15 DIAGNOSIS — J45909 Unspecified asthma, uncomplicated: Secondary | ICD-10-CM | POA: Diagnosis not present

## 2024-03-15 DIAGNOSIS — M810 Age-related osteoporosis without current pathological fracture: Secondary | ICD-10-CM | POA: Diagnosis not present

## 2024-03-15 DIAGNOSIS — M1990 Unspecified osteoarthritis, unspecified site: Secondary | ICD-10-CM | POA: Diagnosis not present

## 2024-03-23 ENCOUNTER — Other Ambulatory Visit (HOSPITAL_COMMUNITY): Payer: Self-pay

## 2024-03-23 ENCOUNTER — Other Ambulatory Visit (INDEPENDENT_AMBULATORY_CARE_PROVIDER_SITE_OTHER): Payer: Self-pay | Admitting: Ophthalmology

## 2024-03-30 ENCOUNTER — Other Ambulatory Visit (HOSPITAL_COMMUNITY): Payer: Self-pay

## 2024-03-30 MED ORDER — WEGOVY 2.4 MG/0.75ML ~~LOC~~ SOAJ
2.4000 mg | SUBCUTANEOUS | 0 refills | Status: DC
  Filled 2024-03-30 – 2024-05-09 (×2): qty 9, 84d supply, fill #0

## 2024-04-04 NOTE — Progress Notes (Signed)
 [facility] - Clinic Note  04/06/2024    CHIEF COMPLAINT Patient presents for Retina Follow Up   HISTORY OF PRESENT ILLNESS: Alicia Miranda is a 69 y.o. female who presents to the clinic today for:   HPI     Retina Follow Up   In left eye.  This started 6 weeks ago.  Duration of 6 weeks.  Since onset it is stable.  I, the attending physician,  performed the HPI with the patient and updated documentation appropriately.        Comments   Pt states vision seems to fluctuate, it hasn't become worse but can be more blurry occasionally. Pt states OS is very sensitive to light. Pt states her eye started to become red/swollen on the outside but has no cause for injury. OS is a bit more itchy on the lower lid and corner of the eye. Pt is consistent using Brimonidine  TID OS, Cosopt  TID OS,  latanoprost  at bedtime OS, and lotemax  every day OS.       Last edited by Valdemar Rogue, MD on 04/13/2024  8:42 PM.     Pt states vision is blurry at times.  Referring physician: Seabron Lenis, MD [phone] [address] [place],  [place] [address]  HISTORICAL INFORMATION:   Selected notes from the MEDICAL RECORD NUMBER Referred by Dr. Nanetta Sharps for possible RD OS LEE: 03/16/02023 Ocular Hx-  PMH- HTN    CURRENT MEDICATIONS: Current Outpatient Medications (Ophthalmic Drugs)  Medication Sig   brimonidine  (ALPHAGAN ) 0.2 % ophthalmic solution INSTILL 1 DROP INTO LEFT EYE THREE TIMES DAILY   Bromfenac  Sodium (PROLENSA ) 0.07 % SOLN Place 1 drop into the left eye daily. PT NEEDS 2 BOTTLES DISPENSED AT A TIME-CALL IF COUPON IS NEEDED   Bromfenac  Sodium 0.09 % SOLN Place 1 drop into the left eye 4 (four) times daily.   dorzolamide -timolol  (COSOPT ) 2-0.5 % ophthalmic solution Place 1 drop into the left eye 3 (three) times daily.   erythromycin  ophthalmic ointment Place 1 Application into the left eye at bedtime. (Patient not taking: Reported on 05/13/2023)   latanoprost   (XALATAN ) 0.005 % ophthalmic solution Place 1 drop into the left eye at bedtime.   latanoprost  (XALATAN ) 0.005 % ophthalmic solution Place 1 drop into the left eye at bedtime.   loteprednol  (LOTEMAX ) 0.5 % ophthalmic suspension INSTILL 1  DROP INTO LEFT EYE TWICE DAILY   No current facility-administered medications for this visit. (Ophthalmic Drugs)   Current Outpatient Medications (Other)  Medication Sig   acetaZOLAMIDE (DIAMOX) 250 MG tablet Take 1 tablet (250 mg total) by mouth daily.   APPLE CIDER VINEGAR PO Take 2 tablets by mouth daily. gummies   Cholecalciferol (VITAMIN D3) 10 MCG (400 UNIT) tablet Take 400 Units by mouth daily.   CINNAMON PO Take 1,000 mg by mouth every other day.   COLLAGEN PO Take 3 capsules by mouth daily.   Denosumab  (PROLIA  Caspian) Inject 60 mg into the skin every 6 (six) months.   FEROSUL 325 (65 Fe) MG tablet Take 325 mg by mouth daily.   fexofenadine (ALLEGRA) 180 MG tablet Take 180 mg by mouth daily.   fluticasone (FLONASE) 50 MCG/ACT nasal spray Place 1 spray into both nostrils daily.   furosemide (LASIX) 40 MG tablet Take 40 mg by mouth 2 (two) times daily.   Glucosamine HCl (GLUCOSAMINE PO) Take 2 tablets by mouth daily.   losartan (COZAAR) 50 MG tablet Take 100 mg by mouth daily.  Magnesium 400 MG CAPS Take 400 mg by mouth daily.   Melatonin 5 MG CAPS Take 10 mg by mouth at bedtime.   metformin (FORTAMET) 500 MG (OSM) 24 hr tablet Take 500 mg by mouth daily with breakfast.   metformin (FORTAMET) 500 MG (OSM) 24 hr tablet    pantoprazole (PROTONIX) 40 MG tablet Take 40 mg by mouth 2 (two) times daily.   rosuvastatin (CRESTOR) 20 MG tablet Take 20 mg by mouth daily.   Semaglutide -Weight Management (WEGOVY ) 1 MG/0.5ML SOAJ Inject 1 mg into the skin once a week.   Semaglutide -Weight Management (WEGOVY ) 1 MG/0.5ML SOAJ Inject 1 mg into the skin once a week.   Semaglutide -Weight Management (WEGOVY ) 1.7 MG/0.75ML SOAJ Inject 1.7 mg under the skin once weekly    Semaglutide -Weight Management (WEGOVY ) 1.7 MG/0.75ML SOAJ Inject 1.7 mg into the skin once a week.   semaglutide -weight management (WEGOVY ) 2.4 MG/0.75ML SOAJ SQ injection Inject 2.4 mg into the skin once a week.   TURMERIC PO Take 2 tablets by mouth daily.   No current facility-administered medications for this visit. (Other)   REVIEW OF SYSTEMS: ROS   Positive for: Musculoskeletal, Cardiovascular, Eyes, Respiratory Negative for: Constitutional, Gastrointestinal, Neurological, Skin, Genitourinary, HENT, Endocrine, Psychiatric, Allergic/Imm, Heme/Lymph Last edited by Elnor Avelina RAMAN, COT on 04/06/2024  1:06 PM.     ALLERGIES Allergies  Allergen Reactions   Codeine Itching   PAST MEDICAL HISTORY Past Medical History:  Diagnosis Date   Allergy    Arthritis    legs   Asthma    GERD (gastroesophageal reflux disease)    Hyperlipidemia    Hypertension    Past Surgical History:  Procedure Laterality Date   ABDOMINAL HYSTERECTOMY     CHOLECYSTECTOMY     COLONOSCOPY     GAS INSERTION Left 09/05/2021   Procedure: INSERTION OF GAS - C3F8;  Surgeon: Valdemar Rogue, MD;  Location: [facility] OR;  Service: Ophthalmology;  Laterality: Left;   GAS/FLUID EXCHANGE Left 09/05/2021   Procedure: GAS/FLUID EXCHANGE;  Surgeon: Valdemar Rogue, MD;  Location: [facility] OR;  Service: Ophthalmology;  Laterality: Left;   PARS PLANA VITRECTOMY Left 09/05/2021   Procedure: PARS PLANA VITRECTOMY WITH 25 GAUGE;  Surgeon: Valdemar Rogue, MD;  Location: [facility] OR;  Service: Ophthalmology;  Laterality: Left;   PERFLUORONE INJECTION Left 09/05/2021   Procedure: PERFLUORON INJECTION;  Surgeon: Valdemar Rogue, MD;  Location: [facility] OR;  Service: Ophthalmology;  Laterality: Left;   PHOTOCOAGULATION WITH LASER Left 09/05/2021   Procedure: PHOTOCOAGULATION WITH LASER;  Surgeon: Valdemar Rogue, MD;  Location: [facility] OR;  Service: Ophthalmology;  Laterality: Left;   SCLERAL BUCKLE Left 09/05/2021   Procedure: SCLERAL BUCKLE;  Surgeon: Valdemar Rogue,  MD;  Location: [facility] OR;  Service: Ophthalmology;  Laterality: Left;   UPPER GASTROINTESTINAL ENDOSCOPY     FAMILY HISTORY Family History  Problem Relation Age of Onset   Retinal detachment Sister    Diabetes Sister    Glaucoma Brother    Diabetes Brother    Colon cancer Brother        dx at 55   Esophageal cancer Neg Hx    Rectal cancer Neg Hx    Stomach cancer Neg Hx    SOCIAL HISTORY Social History   Tobacco Use   Smoking status: Former   Smokeless tobacco: Never   Tobacco comments:    quit smoking in 2002 or 2003  Vaping Use   Vaping status: Never Used  Substance Use Topics   Alcohol use: Yes  Comment: very rarely   Drug use: No       OPHTHALMIC EXAM:  Base Eye Exam     Visual Acuity (Snellen - Linear)       Right Left   Dist [place] 20/20 -1 20/300 -2   Dist ph [place]  20/300 +1         Tonometry (Tonopen, 1:14 PM)       Right Left   Pressure 13 35  1:27pm - 1 gtt OS Brimonidine /Timolol  0.2/0.5%, 1 gtt OS Dorzolomide 2%        Pupils       Pupils Dark Light Shape React APD   Right PERRL 3 2 Round Brisk None   Left PERRL 3 3 Round Minimal None         Visual Fields       Left Right    Full Full         Extraocular Movement       Right Left    Full, Ortho Full, Ortho         Neuro/Psych     Oriented x3: Yes   Mood/Affect: Normal         Dilation     Both eyes: 1.0% Mydriacyl , 2.5% Phenylephrine  @ 1:19 PM           Slit Lamp and Fundus Exam     External Exam       Right Left   External  Periorbital edema         Slit Lamp Exam       Right Left   Lids/Lashes Dermatochalasis - upper lid Dermatochalasis - upper lid, periorbital erythema   Conjunctiva/Sclera white and quiet White and quiet   Cornea Clear mild tear film debris, well healed cataract wound, inferior paracentral corneal haze, 1+ Punctate epithelial erosions   Anterior Chamber deep and clear deep and clear, No cells or flare   Iris Round and reactive  round and dilated   Lens PC IOL in good position PC IOL in good position   Anterior Vitreous syneresis, mild asteroid hyalosis, PVD post vitrectomy, clear -- no residual kenalog          Fundus Exam       Right Left   Disc Pink and Sharp mild Pallor, Sharp rim, Compact   C/D Ratio 0.4 0.3   Macula flat, good foveal reflex, mild RPE mottling, No heme or edema Flat, Blunted foveal reflex, RPE mottling, ERM with striae greatest nasal mac, diffuse edema/cystic changes centrally and nasally, shallow SRF, No heme   Vessels attenuated, Tortuous attenuated, Tortuous   Periphery Attached, operculated hole @ 1030 with mild surrounding pigment -- good laser surrounding, no SRF, no new RT/RD Attached, Trace pockets of SRF superior to disc with fibrosis, focal fibrosis at 0600 posterior to buckle, Retina attached over buckle, good buckle height, good laser over buckle and around tears, PRE-OP: small tear with fibrosis @ 0200, bullous, temporal RD from 1230 to 0430           IMAGING AND PROCEDURES  Imaging and Procedures for 04/06/2024  OCT, Retina - OU - Both Eyes       Right Eye Quality was good. Central Foveal Thickness: 274. Progression has been stable. Findings include normal foveal contour, no IRF, no SRF.   Left Eye Quality was good. Central Foveal Thickness: 775. Progression has been stable. Findings include abnormal foveal contour, epiretinal membrane, intraretinal fluid, macular pucker, subretinal fluid (ERM with blunted foveal contour  and mild pucker, persistent diffuse edema w/ +IRF, SRF -- slightly improved inferiorly).   Notes *Images captured and stored on drive  Diagnosis / Impression:  OD: NFP; no IRF/SRF OS: ERM with blunted foveal contour and mild pucker, persistent diffuse edema w/ +IRF, SRF   Clinical management:  See below  Abbreviations: NFP - Normal foveal profile. CME - cystoid macular edema. PED - pigment epithelial detachment. IRF - intraretinal fluid. SRF -  subretinal fluid. EZ - ellipsoid zone. ERM - epiretinal membrane. ORA - outer retinal atrophy. ORT - outer retinal tubulation. SRHM - subretinal hyper-reflective material. IRHM - intraretinal hyper-reflective material            ASSESSMENT/PLAN:    ICD-10-CM   1. Left retinal detachment  H33.22     2. Cystoid macular edema of left eye  H35.352 OCT, Retina - OU - Both Eyes    3. Epiretinal membrane (ERM) of left eye  H35.372     4. Retinal tear of right eye  H33.311     5. Essential hypertension  I10     6. Hypertensive retinopathy of both eyes  H35.033     7. 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 Rhegmatogenous retinal detachment with retinal hole, left eye - bullous, temporal, mac off detachment, onset of foveal involvement Tuesday, 03.14.23 by history - detached from 1230 to 430 oclock, fovea off, small tear at 0200 - s/p SBP + PPV/PFO/EL/FAX/14% C3F8 OS, 03.23.2023 - retina attached and in good position -- good buckle height and laser around breaks - FA 12.18.23 shows OD Focal staining ST periphery corresponding to laser retinopexy, OS Mild Hyperfluorescence of disc, mild perifoveal staining SN to fovea--?petaloid -- suggestive of CME - s/p STK #1 (11.20.23) for CME, #2 (02.28.24), #3 (07.03.24), #4 (07.03.25) =============== - s/p IVA OS #1 (05.01.24), #2 (05.29.24), #3 (06.26.24) -- IVA resistance =============== - s/p IVE OS #1 (07.24.24), #2 (08.21.24), #3 (09.18.24), #4 (10.16.24) =============== - s/p IVK OS #1 (11.13.24), #2 (12.11.24), #3 (01.08.25), #4 (05.06.25), #5 (06.03.25) =============== - s/p IVV OS #1 (02.05.25 -- sample) #2 (sample -- 03.10.25), #3 (sample -- 04.07.25) ================  - s/p Xipere  inj OS #1 (sample--08.05.25) **h/o good CME response to IVK, but poor steroid/IOP response to IVK** - IOP OS 35 today -- h/o steroid response - BCVA OS 20/350 from 20/250 - OCT shows OS: ERM with  blunted foveal contour and mild pucker, persistent diffuse edema w/ +IRF, SRF at 11 wks post-Xipere   - ?good initial response to Xipere  -- pt reports vision improved after injection but then improvement wore off - continue holding Prolensa  (h/o medicamentosa) - continue holding PF (h/o steroid response) -- cont Lotemax  QD OS - IOP 35 - begin Diamox 250mg  PO QD - continue Brimonidine  TID OS, Cosopt  TID OS, and Latanoprost  at bedtime OS.  - recommend holding tx today w/ f/u in 4 weeks - Xipere  informed consent obtained and signed, 08.05.25 - IVK informed consent obtained and signed, 11.13.24 - IVE informed consent obtained and signed, 07.24.24 (OS) - IVV informed consent obtained and signed, 02.05.25 - Eylea  authorized for 2025 -- [employer] funding unavailable at this time - f/u 4 weeks - DFE, OCT, possible injection   3. Epiretinal membrane, left eye  - ERM with persistent cystic edema - OCT shows persistent ERM w/ cystic changes slightly improved - monitor for now, but discussed possibility of repeat  PPV w/ membrane peel to remove ERM if vision fails to improve  4. Retinal hole OD  - operculated retinal hole located at 1030 with mild surrounding pigment, no SRF - s/p laser retinopexy OD (03.16.23) -- good laser surrounding - monitor  5,6. Hypertensive retinopathy OU - discussed importance of tight BP control - monitor  7. Pseudophakia OU - s/p CE/IOL OS (Dr. Fleeta, 10.04.23), CE with IOL OD (11.01.23, Dr. Fleeta)  - IOL in good position, doing well  - monitor  8. Ocular hypertension OS  - h/o steroid response  - tmax 35 on 10.22.25  - s/p SLT OS w/ Dr. Austin on 12.05.23  - IOP today: 35 - continue Brimonidine  TID OS, Cosopt  TID OS, Latanoprost  at bedtime OS  Ophthalmic Meds Ordered this visit:  Meds ordered this encounter  Medications   acetaZOLAMIDE (DIAMOX) 250 MG tablet    Sig: Take 1 tablet (250 mg total) by mouth daily.    Dispense:  30 tablet    Refill:  3     Return  in about 4 weeks (around 05/04/2024) for f/u, RD, DFE, OCT.  There are no Patient Instructions on file for this visit.  This document serves as a record of services personally performed by Redell JUDITHANN Hans, MD, PhD. It was created on their behalf by Almetta Pesa, an ophthalmic technician. The creation of this record is the provider's dictation and/or activities during the visit.    Electronically signed by: Almetta Pesa, OA, 04/13/24  8:43 PM  This document serves as a record of services personally performed by Redell JUDITHANN Hans, MD, PhD. It was created on their behalf by Wanda GEANNIE Keens, COT an ophthalmic technician. The creation of this record is the provider's dictation and/or activities during the visit.    Electronically signed by:  Wanda GEANNIE Keens, COT  04/13/24 8:43 PM  Redell JUDITHANN Hans, M.D., Ph.D. Diseases & Surgery of the Retina and Vitreous [facility]  I have reviewed the above documentation for accuracy and completeness, and I agree with the above. Redell JUDITHANN Hans, M.D., Ph.D. 04/13/24 8:49 PM   Abbreviations: M myopia (nearsighted); A astigmatism; H hyperopia (farsighted); P presbyopia; Mrx spectacle prescription;  CTL contact lenses; OD right eye; OS left eye; OU both eyes  XT exotropia; ET esotropia; PEK punctate epithelial keratitis; PEE punctate epithelial erosions; DES dry eye syndrome; MGD meibomian gland dysfunction; ATs artificial tears; PFAT's preservative free artificial tears; NSC nuclear sclerotic cataract; PSC posterior subcapsular cataract; ERM epi-retinal membrane; PVD posterior vitreous detachment; RD retinal detachment; DM diabetes mellitus; DR diabetic retinopathy; NPDR non-proliferative diabetic retinopathy; PDR proliferative diabetic retinopathy; CSME clinically significant macular edema; DME diabetic macular edema; dbh dot blot hemorrhages; CWS cotton wool spot; POAG primary open angle glaucoma; C/D cup-to-disc ratio; HVF  humphrey visual field; GVF goldmann visual field; OCT optical coherence tomography; IOP intraocular pressure; BRVO Branch retinal vein occlusion; CRVO central retinal vein occlusion; CRAO central retinal artery occlusion; BRAO branch retinal artery occlusion; RT retinal tear; SB scleral buckle; PPV pars plana vitrectomy; VH Vitreous hemorrhage; PRP panretinal laser photocoagulation; IVK intravitreal kenalog ; VMT vitreomacular traction; MH Macular hole;  NVD neovascularization of the disc; NVE neovascularization elsewhere; AREDS age related eye disease study; ARMD age related macular degeneration; POAG primary open angle glaucoma; EBMD epithelial/anterior basement membrane dystrophy; ACIOL anterior chamber intraocular lens; IOL intraocular lens; PCIOL posterior chamber intraocular lens; Phaco/IOL phacoemulsification with intraocular lens placement; PRK photorefractive keratectomy; LASIK laser assisted in situ keratomileusis; HTN  hypertension; DM diabetes mellitus; COPD chronic obstructive pulmonary disease

## 2024-04-06 ENCOUNTER — Other Ambulatory Visit (HOSPITAL_COMMUNITY): Payer: Self-pay

## 2024-04-06 ENCOUNTER — Ambulatory Visit (INDEPENDENT_AMBULATORY_CARE_PROVIDER_SITE_OTHER): Admitting: Ophthalmology

## 2024-04-06 ENCOUNTER — Encounter (INDEPENDENT_AMBULATORY_CARE_PROVIDER_SITE_OTHER): Payer: Self-pay | Admitting: Ophthalmology

## 2024-04-06 ENCOUNTER — Other Ambulatory Visit (HOSPITAL_BASED_OUTPATIENT_CLINIC_OR_DEPARTMENT_OTHER): Payer: Self-pay

## 2024-04-06 DIAGNOSIS — Z961 Presence of intraocular lens: Secondary | ICD-10-CM | POA: Diagnosis not present

## 2024-04-06 DIAGNOSIS — H35372 Puckering of macula, left eye: Secondary | ICD-10-CM | POA: Diagnosis not present

## 2024-04-06 DIAGNOSIS — H40052 Ocular hypertension, left eye: Secondary | ICD-10-CM

## 2024-04-06 DIAGNOSIS — I10 Essential (primary) hypertension: Secondary | ICD-10-CM

## 2024-04-06 DIAGNOSIS — H3322 Serous retinal detachment, left eye: Secondary | ICD-10-CM | POA: Diagnosis not present

## 2024-04-06 DIAGNOSIS — H35352 Cystoid macular degeneration, left eye: Secondary | ICD-10-CM | POA: Diagnosis not present

## 2024-04-06 DIAGNOSIS — H35033 Hypertensive retinopathy, bilateral: Secondary | ICD-10-CM

## 2024-04-06 DIAGNOSIS — H33311 Horseshoe tear of retina without detachment, right eye: Secondary | ICD-10-CM | POA: Diagnosis not present

## 2024-04-06 MED ORDER — ACETAZOLAMIDE 250 MG PO TABS
250.0000 mg | ORAL_TABLET | Freq: Every day | ORAL | 3 refills | Status: AC
  Filled 2024-04-06: qty 30, 30d supply, fill #0
  Filled 2024-05-04 – 2024-05-05 (×2): qty 30, 30d supply, fill #1

## 2024-04-13 ENCOUNTER — Encounter (INDEPENDENT_AMBULATORY_CARE_PROVIDER_SITE_OTHER): Payer: Self-pay | Admitting: Ophthalmology

## 2024-04-15 DIAGNOSIS — J45909 Unspecified asthma, uncomplicated: Secondary | ICD-10-CM | POA: Diagnosis not present

## 2024-04-15 DIAGNOSIS — E785 Hyperlipidemia, unspecified: Secondary | ICD-10-CM | POA: Diagnosis not present

## 2024-04-15 DIAGNOSIS — M1990 Unspecified osteoarthritis, unspecified site: Secondary | ICD-10-CM | POA: Diagnosis not present

## 2024-04-15 DIAGNOSIS — M810 Age-related osteoporosis without current pathological fracture: Secondary | ICD-10-CM | POA: Diagnosis not present

## 2024-04-16 ENCOUNTER — Other Ambulatory Visit (HOSPITAL_COMMUNITY): Payer: Self-pay

## 2024-04-25 NOTE — Progress Notes (Signed)
 [facility] - Clinic Note  05/04/2024    CHIEF COMPLAINT Patient presents for Retina Follow Up   HISTORY OF PRESENT ILLNESS: Alicia Miranda is a 68 y.o. female who presents to the clinic today for:   HPI     Retina Follow Up   In left eye.  This started 6 weeks ago.  Duration of 6 weeks.  Since onset it is stable.  I, the attending physician,  performed the HPI with the patient and updated documentation appropriately.        Comments   Pt states vision seems to fluctuate, it hasn't become worse but can be more blurry occasionally. Pt has the most difficulty reading. Pt states OS is less sensitive to light. Pt is consistent using Brimonidine  TID OS, Cosopt  TID OS,  latanoprost  at bedtime OS, and lotemax  qam OS. Pt has started Diamox  250mg  PO every day and is consistent.        Last edited by Alicia Rogue, MD on 05/08/2024  7:35 PM.    Pt states   Referring physician: Seabron Lenis, MD [phone] [address] [place],  [place] [address]  HISTORICAL INFORMATION:   Selected notes from the MEDICAL RECORD NUMBER Referred by Dr. Nanetta Miranda for possible RD OS LEE: 03/16/02023 Ocular Hx-  PMH- HTN    CURRENT MEDICATIONS: Current Outpatient Medications (Ophthalmic Drugs)  Medication Sig   brimonidine  (ALPHAGAN ) 0.2 % ophthalmic solution INSTILL 1 DROP INTO LEFT EYE THREE TIMES DAILY   Bromfenac  Sodium (PROLENSA ) 0.07 % SOLN Place 1 drop into the left eye daily. PT NEEDS 2 BOTTLES DISPENSED AT A TIME-CALL IF COUPON IS NEEDED   Bromfenac  Sodium 0.09 % SOLN Place 1 drop into the left eye 4 (four) times daily.   dorzolamide -timolol  (COSOPT ) 2-0.5 % ophthalmic solution Place 1 drop into the left eye 3 (three) times daily.   erythromycin  ophthalmic ointment Place 1 Application into the left eye at bedtime. (Patient not taking: Reported on 05/13/2023)   latanoprost  (XALATAN ) 0.005 % ophthalmic solution Place 1 drop into the left eye at bedtime.   latanoprost   (XALATAN ) 0.005 % ophthalmic solution Place 1 drop into the left eye at bedtime.   loteprednol  (LOTEMAX ) 0.5 % ophthalmic suspension INSTILL 1  DROP INTO LEFT EYE TWICE DAILY   No current facility-administered medications for this visit. (Ophthalmic Drugs)   Current Outpatient Medications (Other)  Medication Sig   acetaZOLAMIDE  (DIAMOX ) 250 MG tablet Take 2 tablets (500 mg total) by mouth daily.   acetaZOLAMIDE  (DIAMOX ) 250 MG tablet Take 1 tablet (250 mg total) by mouth daily.   APPLE CIDER VINEGAR PO Take 2 tablets by mouth daily. gummies   Cholecalciferol (VITAMIN D3) 10 MCG (400 UNIT) tablet Take 400 Units by mouth daily.   CINNAMON PO Take 1,000 mg by mouth every other day.   COLLAGEN PO Take 3 capsules by mouth daily.   Denosumab  (PROLIA  [place]) Inject 60 mg into the skin every 6 (six) months.   FEROSUL 325 (65 Fe) MG tablet Take 325 mg by mouth daily.   fexofenadine (ALLEGRA) 180 MG tablet Take 180 mg by mouth daily.   fluticasone (FLONASE) 50 MCG/ACT nasal spray Place 1 spray into both nostrils daily.   furosemide (LASIX) 40 MG tablet Take 40 mg by mouth 2 (two) times daily.   Glucosamine HCl (GLUCOSAMINE PO) Take 2 tablets by mouth daily.   losartan (COZAAR) 50 MG tablet Take 100 mg by mouth daily.   Magnesium 400  MG CAPS Take 400 mg by mouth daily.   Melatonin 5 MG CAPS Take 10 mg by mouth at bedtime.   metformin (FORTAMET) 500 MG (OSM) 24 hr tablet Take 500 mg by mouth daily with breakfast.   metformin (FORTAMET) 500 MG (OSM) 24 hr tablet    pantoprazole (PROTONIX) 40 MG tablet Take 40 mg by mouth 2 (two) times daily.   rosuvastatin (CRESTOR) 20 MG tablet Take 20 mg by mouth daily.   Semaglutide -Weight Management (WEGOVY ) 1 MG/0.5ML SOAJ Inject 1 mg into the skin once a week.   Semaglutide -Weight Management (WEGOVY ) 1 MG/0.5ML SOAJ Inject 1 mg into the skin once a week.   Semaglutide -Weight Management (WEGOVY ) 1.7 MG/0.75ML SOAJ Inject 1.7 mg under the skin once weekly    Semaglutide -Weight Management (WEGOVY ) 1.7 MG/0.75ML SOAJ Inject 1.7 mg into the skin once a week.   semaglutide -weight management (WEGOVY ) 2.4 MG/0.75ML SOAJ SQ injection Inject 2.4 mg into the skin once a week.   TURMERIC PO Take 2 tablets by mouth daily.   No current facility-administered medications for this visit. (Other)   REVIEW OF SYSTEMS: ROS   Positive for: Musculoskeletal, Cardiovascular, Eyes, Respiratory Negative for: Constitutional, Gastrointestinal, Neurological, Skin, Genitourinary, HENT, Endocrine, Psychiatric, Allergic/Imm, Heme/Lymph Last edited by Alicia Miranda, COT on 05/04/2024  2:01 PM.      ALLERGIES Allergies  Allergen Reactions   Codeine Itching   PAST MEDICAL HISTORY Past Medical History:  Diagnosis Date   Allergy    Arthritis    legs   Asthma    GERD (gastroesophageal reflux disease)    Hyperlipidemia    Hypertension    Past Surgical History:  Procedure Laterality Date   ABDOMINAL HYSTERECTOMY     CHOLECYSTECTOMY     COLONOSCOPY     GAS INSERTION Left 09/05/2021   Procedure: INSERTION OF GAS - C3F8;  Surgeon: Alicia Rogue, MD;  Location: [facility] OR;  Service: Ophthalmology;  Laterality: Left;   GAS/FLUID EXCHANGE Left 09/05/2021   Procedure: GAS/FLUID EXCHANGE;  Surgeon: Alicia Rogue, MD;  Location: [facility] OR;  Service: Ophthalmology;  Laterality: Left;   PARS PLANA VITRECTOMY Left 09/05/2021   Procedure: PARS PLANA VITRECTOMY WITH 25 GAUGE;  Surgeon: Alicia Rogue, MD;  Location: [facility] OR;  Service: Ophthalmology;  Laterality: Left;   PERFLUORONE INJECTION Left 09/05/2021   Procedure: PERFLUORON INJECTION;  Surgeon: Alicia Rogue, MD;  Location: [facility] OR;  Service: Ophthalmology;  Laterality: Left;   PHOTOCOAGULATION WITH LASER Left 09/05/2021   Procedure: PHOTOCOAGULATION WITH LASER;  Surgeon: Alicia Rogue, MD;  Location: [facility] OR;  Service: Ophthalmology;  Laterality: Left;   SCLERAL BUCKLE Left 09/05/2021   Procedure: SCLERAL BUCKLE;  Surgeon: Alicia Rogue, MD;  Location: [facility] OR;  Service: Ophthalmology;  Laterality: Left;   UPPER GASTROINTESTINAL ENDOSCOPY     FAMILY HISTORY Family History  Problem Relation Age of Onset   Retinal detachment Sister    Diabetes Sister    Glaucoma Brother    Diabetes Brother    Colon cancer Brother        dx at 15   Esophageal cancer Neg Hx    Rectal cancer Neg Hx    Stomach cancer Neg Hx    SOCIAL HISTORY Social History   Tobacco Use   Smoking status: Former   Smokeless tobacco: Never   Tobacco comments:    quit smoking in 2002 or 2003  Vaping Use   Vaping status: Never Used  Substance Use Topics   Alcohol use: Yes  Comment: very rarely   Drug use: No       OPHTHALMIC EXAM:  Base Eye Exam     Visual Acuity (Snellen - Linear)       Right Left   Dist [place] 20/20 20/250 -2   Dist ph [place]  NI         Tonometry (Tonopen, 2:07 PM)       Right Left   Pressure 8 27         Pupils       Pupils Dark Light Shape React APD   Right PERRL 3 2 Round Brisk None   Left PERRL 3 2 Round Brisk None         Visual Fields       Left Right    Full Full         Extraocular Movement       Right Left    Full, Ortho Full, Ortho         Neuro/Psych     Oriented x3: Yes   Mood/Affect: Normal         Dilation     Both eyes: 1.0% Mydriacyl , 2.5% Phenylephrine  @ 2:08 PM           Slit Lamp and Fundus Exam     External Exam       Right Left   External  Periorbital edema         Slit Lamp Exam       Right Left   Lids/Lashes Dermatochalasis - upper lid Dermatochalasis - upper lid, periorbital erythema   Conjunctiva/Sclera white and quiet White and quiet   Cornea Clear mild tear film debris, well healed cataract wound, inferior paracentral corneal haze, trace Punctate epithelial erosions   Anterior Chamber deep and clear deep and clear, No cells or flare   Iris Round and reactive round and dilated, mild atrophy at 400   Lens PC IOL in good position PC IOL  in good position   Anterior Vitreous syneresis, mild asteroid hyalosis, PVD post vitrectomy, clear -- no residual kenalog          Fundus Exam       Right Left   Disc Pink and Sharp mild Pallor, Sharp rim, Compact   C/D Ratio 0.4 0.3   Macula flat, good foveal reflex, mild RPE mottling, No heme or edema Flat, Blunted foveal reflex, RPE mottling, ERM with striae greatest nasal mac, diffuse edema/cystic changes centrally and nasally, shallow SRF, No heme   Vessels attenuated, Tortuous attenuated, Tortuous   Periphery Attached, operculated hole @ 1030 with mild surrounding pigment -- good laser surrounding, no SRF, no new RT/RD Attached, Trace pockets of SRF superior to disc with fibrosis, focal fibrosis at 0600 posterior to buckle, Retina attached over buckle, good buckle height, good laser over buckle and around tears, PRE-OP: small tear with fibrosis @ 0200, bullous, temporal RD from 1230 to 0430           IMAGING AND PROCEDURES  Imaging and Procedures for 05/04/2024  OCT, Retina - OU - Both Eyes       Right Eye Quality was good. Central Foveal Thickness: 274. Progression has been stable. Findings include normal foveal contour, no IRF, no SRF.   Left Eye Quality was good. Central Foveal Thickness: 771. Progression has been stable. Findings include abnormal foveal contour, epiretinal membrane, intraretinal fluid, macular pucker, subretinal fluid (ERM with blunted foveal contour and mild pucker, persistent diffuse edema w/ +IRF, SRF--?SRF slightly  increased).   Notes *Images captured and stored on drive  Diagnosis / Impression:  OD: NFP; no IRF/SRF OS: ERM with blunted foveal contour and mild pucker, persistent diffuse edema w/ +IRF, SRF--?SRF slightly increased   Clinical management:  See below  Abbreviations: NFP - Normal foveal profile. CME - cystoid macular edema. PED - pigment epithelial detachment. IRF - intraretinal fluid. SRF - subretinal fluid. EZ - ellipsoid zone. ERM  - epiretinal membrane. ORA - outer retinal atrophy. ORT - outer retinal tubulation. SRHM - subretinal hyper-reflective material. IRHM - intraretinal hyper-reflective material             ASSESSMENT/PLAN:    ICD-10-CM   1. Left retinal detachment  H33.22     2. Cystoid macular edema of left eye  H35.352 OCT, Retina - OU - Both Eyes    3. Epiretinal membrane (ERM) of left eye  H35.372     4. Retinal tear of right eye  H33.311     5. Essential hypertension  I10     6. Hypertensive retinopathy of both eyes  H35.033     7. 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 Rhegmatogenous retinal detachment with retinal hole, left eye - bullous, temporal, mac off detachment, onset of foveal involvement Tuesday, 03.14.23 by history - detached from 1230 to 430 oclock, fovea off, small tear at 0200 - s/p SBP + PPV/PFO/EL/FAX/14% C3F8 OS, 03.23.2023 - retina attached and in good position -- good buckle height and laser around breaks - FA 12.18.23 shows OD Focal staining ST periphery corresponding to laser retinopexy, OS Mild Hyperfluorescence of disc, mild perifoveal staining SN to fovea--?petaloid -- suggestive of CME - s/p STK #1 (11.20.23) for CME, #2 (02.28.24), #3 (07.03.24), #4 (07.03.25) =============== - s/p IVA OS #1 (05.01.24), #2 (05.29.24), #3 (06.26.24) -- IVA resistance =============== - s/p IVE OS #1 (07.24.24), #2 (08.21.24), #3 (09.18.24), #4 (10.16.24) =============== - s/p IVK OS #1 (11.13.24), #2 (12.11.24), #3 (01.08.25), #4 (05.06.25), #5 (06.03.25) =============== - s/p IVV OS #1 (02.05.25 -- sample) #2 (sample -- 03.10.25), #3 (sample -- 04.07.25) ================  - s/p Xipere  inj OS #1 (sample--08.05.25) **h/o good CME response to IVK, but poor steroid/IOP response to IVK** - BCVA OS 20/250 from 20/300 - OCT shows OS: ERM with blunted foveal contour and mild pucker, persistent diffuse edema w/ +IRF, SRF--?SRF  slightly increased at 15 wks post-Xipere   - ?good initial response to Xipere  -- pt reports vision improved after injection but then improvement wore off - continue holding Prolensa  (h/o medicamentosa) - continue holding PF (h/o steroid response) -- cont Lotemax  QD OS - IOP 27 --h/o steroid response --increase Diamox  to 500mg  (2 tablets) in the morning  - continue Brimonidine  BID OS, Cosopt  BID OS, and Latanoprost  at bedtime OS.  - recommend holding tx today w/ f/u in 4 weeks - Xipere  informed consent obtained and signed, 08.05.25 - IVK informed consent obtained and signed, 11.13.24 - IVE informed consent obtained and signed, 07.24.24 (OS) - IVV informed consent obtained and signed, 02.05.25 - [employer]  authorized for 2025 -- [employer] funding unavailable at this time - f/u 4 weeks - DFE, OCT, possible IVK OS   3. Epiretinal membrane, left eye  - ERM with persistent cystic edema - OCT shows persistent ERM w/ cystic changes slightly improved - monitor for now, but discussed possibility of repeat PPV w/ membrane peel to remove ERM  if vision fails to improve  4. Retinal hole OD  - operculated retinal hole located at 1030 with mild surrounding pigment, no SRF - s/p laser retinopexy OD (03.16.23) -- good laser surrounding - monitor  5,6. Hypertensive retinopathy OU - discussed importance of tight BP control - monitor  7. Pseudophakia OU - s/p CE/IOL OS (Dr. Fleeta, 10.04.23), CE with IOL OD (11.01.23, Dr. Fleeta)  - IOL in good position, doing well  - monitor  8. Ocular hypertension OS  - h/o steroid response  - tmax 35 on 10.22.25  - s/p SLT OS w/ Dr. Austin on 12.05.23  - IOP today: 35 - continue Brimonidine  TID OS, Cosopt  TID OS, Latanoprost  at bedtime OS  Ophthalmic Meds Ordered this visit:  Meds ordered this encounter  Medications   acetaZOLAMIDE  (DIAMOX ) 250 MG tablet    Sig: Take 2 tablets (500 mg total) by mouth daily.    Dispense:  180 tablet    Refill:  1     Return in about  4 weeks (around 06/01/2024) for CME OS, DFE, OCT, possible IVK OS.  There are no Patient Instructions on file for this visit.  This document serves as a record of services personally performed by Redell JUDITHANN Hans, MD, PhD. It was created on their behalf by Almetta Pesa, an ophthalmic technician. The creation of this record is the provider's dictation and/or activities during the visit.    Electronically signed by: Almetta Pesa, OA, 05/08/24  7:35 PM  This document serves as a record of services personally performed by Redell JUDITHANN Hans, MD, PhD. It was created on their behalf by Wanda GEANNIE Keens, COT an ophthalmic technician. The creation of this record is the provider's dictation and/or activities during the visit.    Electronically signed by:  Wanda GEANNIE Keens, COT  05/08/24 7:35 PM  Redell JUDITHANN Hans, M.D., Ph.D. Diseases & Surgery of the Retina and Vitreous [facility]  I have reviewed the above documentation for accuracy and completeness, and I agree with the above. Redell JUDITHANN Hans, M.D., Ph.D. 05/08/24 7:40 PM    Abbreviations: M myopia (nearsighted); A astigmatism; H hyperopia (farsighted); P presbyopia; Mrx spectacle prescription;  CTL contact lenses; OD right eye; OS left eye; OU both eyes  XT exotropia; ET esotropia; PEK punctate epithelial keratitis; PEE punctate epithelial erosions; DES dry eye syndrome; MGD meibomian gland dysfunction; ATs artificial tears; PFAT's preservative free artificial tears; NSC nuclear sclerotic cataract; PSC posterior subcapsular cataract; ERM epi-retinal membrane; PVD posterior vitreous detachment; RD retinal detachment; DM diabetes mellitus; DR diabetic retinopathy; NPDR non-proliferative diabetic retinopathy; PDR proliferative diabetic retinopathy; CSME clinically significant macular edema; DME diabetic macular edema; dbh dot blot hemorrhages; CWS cotton wool spot; POAG primary open angle glaucoma; C/D cup-to-disc ratio;  HVF humphrey visual field; GVF goldmann visual field; OCT optical coherence tomography; IOP intraocular pressure; BRVO Branch retinal vein occlusion; CRVO central retinal vein occlusion; CRAO central retinal artery occlusion; BRAO branch retinal artery occlusion; RT retinal tear; SB scleral buckle; PPV pars plana vitrectomy; VH Vitreous hemorrhage; PRP panretinal laser photocoagulation; IVK intravitreal kenalog ; VMT vitreomacular traction; MH Macular hole;  NVD neovascularization of the disc; NVE neovascularization elsewhere; AREDS age related eye disease study; ARMD age related macular degeneration; POAG primary open angle glaucoma; EBMD epithelial/anterior basement membrane dystrophy; ACIOL anterior chamber intraocular lens; IOL intraocular lens; PCIOL posterior chamber intraocular lens; Phaco/IOL phacoemulsification with intraocular lens placement; PRK photorefractive keratectomy; LASIK laser assisted in situ keratomileusis; HTN hypertension; DM diabetes  mellitus; COPD chronic obstructive pulmonary disease

## 2024-05-04 ENCOUNTER — Other Ambulatory Visit: Payer: Self-pay

## 2024-05-04 ENCOUNTER — Other Ambulatory Visit (HOSPITAL_COMMUNITY): Payer: Self-pay

## 2024-05-04 ENCOUNTER — Encounter (INDEPENDENT_AMBULATORY_CARE_PROVIDER_SITE_OTHER): Payer: Self-pay | Admitting: Ophthalmology

## 2024-05-04 ENCOUNTER — Ambulatory Visit (INDEPENDENT_AMBULATORY_CARE_PROVIDER_SITE_OTHER): Admitting: Ophthalmology

## 2024-05-04 DIAGNOSIS — H3322 Serous retinal detachment, left eye: Secondary | ICD-10-CM

## 2024-05-04 DIAGNOSIS — H35033 Hypertensive retinopathy, bilateral: Secondary | ICD-10-CM

## 2024-05-04 DIAGNOSIS — H35372 Puckering of macula, left eye: Secondary | ICD-10-CM

## 2024-05-04 DIAGNOSIS — Z961 Presence of intraocular lens: Secondary | ICD-10-CM | POA: Diagnosis not present

## 2024-05-04 DIAGNOSIS — H35352 Cystoid macular degeneration, left eye: Secondary | ICD-10-CM

## 2024-05-04 DIAGNOSIS — H33311 Horseshoe tear of retina without detachment, right eye: Secondary | ICD-10-CM

## 2024-05-04 DIAGNOSIS — H40052 Ocular hypertension, left eye: Secondary | ICD-10-CM | POA: Diagnosis not present

## 2024-05-04 DIAGNOSIS — I10 Essential (primary) hypertension: Secondary | ICD-10-CM | POA: Diagnosis not present

## 2024-05-04 MED ORDER — ACETAZOLAMIDE 250 MG PO TABS
500.0000 mg | ORAL_TABLET | Freq: Every day | ORAL | 1 refills | Status: AC
  Filled 2024-05-04: qty 180, 90d supply, fill #0
  Filled 2024-05-04: qty 168, 84d supply, fill #0
  Filled 2024-05-04: qty 12, 6d supply, fill #0
  Filled 2024-05-05: qty 150, 75d supply, fill #1
  Filled 2024-05-05 (×2): qty 30, 15d supply, fill #1
  Filled 2024-05-05: qty 180, 90d supply, fill #1

## 2024-05-05 ENCOUNTER — Other Ambulatory Visit (HOSPITAL_BASED_OUTPATIENT_CLINIC_OR_DEPARTMENT_OTHER): Payer: Self-pay

## 2024-05-05 ENCOUNTER — Other Ambulatory Visit (HOSPITAL_COMMUNITY): Payer: Self-pay

## 2024-05-05 ENCOUNTER — Other Ambulatory Visit: Payer: Self-pay

## 2024-05-06 ENCOUNTER — Other Ambulatory Visit: Payer: Self-pay

## 2024-05-08 ENCOUNTER — Encounter (INDEPENDENT_AMBULATORY_CARE_PROVIDER_SITE_OTHER): Payer: Self-pay | Admitting: Ophthalmology

## 2024-05-09 ENCOUNTER — Other Ambulatory Visit: Payer: Self-pay

## 2024-05-09 ENCOUNTER — Other Ambulatory Visit (HOSPITAL_COMMUNITY): Payer: Self-pay

## 2024-05-10 ENCOUNTER — Other Ambulatory Visit: Payer: Self-pay

## 2024-05-10 ENCOUNTER — Other Ambulatory Visit (HOSPITAL_COMMUNITY): Payer: Self-pay

## 2024-05-15 DIAGNOSIS — M810 Age-related osteoporosis without current pathological fracture: Secondary | ICD-10-CM | POA: Diagnosis not present

## 2024-05-15 DIAGNOSIS — M1990 Unspecified osteoarthritis, unspecified site: Secondary | ICD-10-CM | POA: Diagnosis not present

## 2024-05-15 DIAGNOSIS — J45909 Unspecified asthma, uncomplicated: Secondary | ICD-10-CM | POA: Diagnosis not present

## 2024-05-15 DIAGNOSIS — E785 Hyperlipidemia, unspecified: Secondary | ICD-10-CM | POA: Diagnosis not present

## 2024-05-18 DIAGNOSIS — I10 Essential (primary) hypertension: Secondary | ICD-10-CM | POA: Diagnosis not present

## 2024-05-18 DIAGNOSIS — K5900 Constipation, unspecified: Secondary | ICD-10-CM | POA: Diagnosis not present

## 2024-05-18 DIAGNOSIS — R7303 Prediabetes: Secondary | ICD-10-CM | POA: Diagnosis not present

## 2024-05-18 DIAGNOSIS — R600 Localized edema: Secondary | ICD-10-CM | POA: Diagnosis not present

## 2024-05-18 DIAGNOSIS — K219 Gastro-esophageal reflux disease without esophagitis: Secondary | ICD-10-CM | POA: Diagnosis not present

## 2024-05-18 DIAGNOSIS — Z6836 Body mass index (BMI) 36.0-36.9, adult: Secondary | ICD-10-CM | POA: Diagnosis not present

## 2024-05-18 DIAGNOSIS — E785 Hyperlipidemia, unspecified: Secondary | ICD-10-CM | POA: Diagnosis not present

## 2024-05-18 DIAGNOSIS — F5081 Binge eating disorder, mild: Secondary | ICD-10-CM | POA: Diagnosis not present

## 2024-05-25 ENCOUNTER — Other Ambulatory Visit (HOSPITAL_COMMUNITY): Payer: Self-pay

## 2024-05-28 ENCOUNTER — Other Ambulatory Visit (INDEPENDENT_AMBULATORY_CARE_PROVIDER_SITE_OTHER): Payer: Self-pay | Admitting: Ophthalmology

## 2024-06-01 NOTE — Progress Notes (Signed)
 [facility] - Clinic Note  06/02/2024    CHIEF COMPLAINT Patient presents for Retina Follow Up   HISTORY OF PRESENT ILLNESS: Alicia Miranda is a 68 y.o. female who presents to the clinic today for:   HPI     Retina Follow Up   In left eye.  This started 4 weeks ago.  Duration of 4 weeks.  Since onset it is stable.        Comments   4 week retina follow up CME OS pt is reporting no vision changes noticed she denies any flashes or floaters she is using Brimonidine  TID OS, Cosopt  TID OS, Latanoprost  at bedtime OS      Last edited by Resa Delon ORN, COT on 06/02/2024  8:36 AM.     Pt states vision seems better than others-goes in and out of being blurry throughout the day. Reportedly seeing better mid day.  Reports compliance w/ diamox  and gtts.   Referring physician: Seabron Lenis, MD [address] [place],  [place] [address]  HISTORICAL INFORMATION:   Selected notes from the MEDICAL RECORD NUMBER Referred by Dr. Nanetta Sharps for possible RD OS LEE: 03/16/02023 Ocular Hx-  PMH- HTN    CURRENT MEDICATIONS: Current Outpatient Medications (Ophthalmic Drugs)  Medication Sig   brimonidine  (ALPHAGAN ) 0.2 % ophthalmic solution INSTILL 1 DROP INTO LEFT EYE THREE TIMES DAILY   dorzolamide -timolol  (COSOPT ) 2-0.5 % ophthalmic solution Place 1 drop into the left eye 3 (three) times daily.   latanoprost  (XALATAN ) 0.005 % ophthalmic solution Place 1 drop into the left eye at bedtime.   Bromfenac  Sodium (PROLENSA ) 0.07 % SOLN Place 1 drop into the left eye daily. PT NEEDS 2 BOTTLES DISPENSED AT A TIME-CALL IF COUPON IS NEEDED   Bromfenac  Sodium 0.09 % SOLN Place 1 drop into the left eye 4 (four) times daily.   erythromycin  ophthalmic ointment Place 1 Application into the left eye at bedtime. (Patient not taking: Reported on 05/13/2023)   latanoprost  (XALATAN ) 0.005 % ophthalmic solution Place 1 drop into the left eye at bedtime.   loteprednol   (LOTEMAX ) 0.5 % ophthalmic suspension Place 1 drop into the left eye daily.   No current facility-administered medications for this visit. (Ophthalmic Drugs)   Current Outpatient Medications (Other)  Medication Sig   acetaZOLAMIDE  (DIAMOX ) 250 MG tablet Take 2 tablets (500 mg total) by mouth daily.   APPLE CIDER VINEGAR PO Take 2 tablets by mouth daily. gummies   Cholecalciferol (VITAMIN D3) 10 MCG (400 UNIT) tablet Take 400 Units by mouth daily.   COLLAGEN PO Take 3 capsules by mouth daily.   Denosumab  (PROLIA  [place]) Inject 60 mg into the skin every 6 (six) months.   fluticasone (FLONASE) 50 MCG/ACT nasal spray Place 1 spray into both nostrils daily.   furosemide (LASIX) 40 MG tablet Take 40 mg by mouth 2 (two) times daily.   Glucosamine HCl (GLUCOSAMINE PO) Take 2 tablets by mouth daily.   losartan (COZAAR) 50 MG tablet Take 100 mg by mouth daily.   Magnesium 400 MG CAPS Take 400 mg by mouth daily.   Melatonin 5 MG CAPS Take 10 mg by mouth at bedtime.   metformin (FORTAMET) 500 MG (OSM) 24 hr tablet Take 500 mg by mouth daily with breakfast.   pantoprazole (PROTONIX) 40 MG tablet Take 40 mg by mouth 2 (two) times daily.   predniSONE  (DELTASONE ) 10 MG tablet Take 4 tablets (40 mg total) by mouth daily with breakfast.  rosuvastatin (CRESTOR) 20 MG tablet Take 20 mg by mouth daily.   semaglutide -weight management (WEGOVY ) 2.4 MG/0.75ML SOAJ SQ injection Inject 2.4 mg into the skin once a week.   TURMERIC PO Take 2 tablets by mouth daily.   acetaZOLAMIDE  (DIAMOX ) 250 MG tablet Take 1 tablet (250 mg total) by mouth daily.   CINNAMON PO Take 1,000 mg by mouth every other day.   FEROSUL 325 (65 Fe) MG tablet Take 325 mg by mouth daily.   fexofenadine (ALLEGRA) 180 MG tablet Take 180 mg by mouth daily.   metformin (FORTAMET) 500 MG (OSM) 24 hr tablet    Semaglutide -Weight Management (WEGOVY ) 1 MG/0.5ML SOAJ Inject 1 mg into the skin once a week.   Semaglutide -Weight Management (WEGOVY ) 1  MG/0.5ML SOAJ Inject 1 mg into the skin once a week.   Semaglutide -Weight Management (WEGOVY ) 1.7 MG/0.75ML SOAJ Inject 1.7 mg under the skin once weekly   Semaglutide -Weight Management (WEGOVY ) 1.7 MG/0.75ML SOAJ Inject 1.7 mg into the skin once a week.   No current facility-administered medications for this visit. (Other)   REVIEW OF SYSTEMS: ROS   Positive for: Musculoskeletal, Cardiovascular, Eyes, Respiratory Negative for: Constitutional, Gastrointestinal, Neurological, Skin, Genitourinary, HENT, Endocrine, Psychiatric, Allergic/Imm, Heme/Lymph Last edited by Resa Delon ORN, COT on 06/02/2024  8:36 AM.       ALLERGIES Allergies  Allergen Reactions   Codeine Itching   PAST MEDICAL HISTORY Past Medical History:  Diagnosis Date   Allergy    Arthritis    legs   Asthma    GERD (gastroesophageal reflux disease)    Hyperlipidemia    Hypertension    Past Surgical History:  Procedure Laterality Date   ABDOMINAL HYSTERECTOMY     CHOLECYSTECTOMY     COLONOSCOPY     GAS INSERTION Left 09/05/2021   Procedure: INSERTION OF GAS - C3F8;  Surgeon: Valdemar Rogue, MD;  Location: [facility] OR;  Service: Ophthalmology;  Laterality: Left;   GAS/FLUID EXCHANGE Left 09/05/2021   Procedure: GAS/FLUID EXCHANGE;  Surgeon: Valdemar Rogue, MD;  Location: [facility] OR;  Service: Ophthalmology;  Laterality: Left;   PARS PLANA VITRECTOMY Left 09/05/2021   Procedure: PARS PLANA VITRECTOMY WITH 25 GAUGE;  Surgeon: Valdemar Rogue, MD;  Location[facility] OR;  Service: Ophthalmology;  Laterality: Left;   PERFLUORONE INJECTION Left 09/05/2021   Procedure: PERFLUORON INJECTION;  Surgeon: Valdemar Rogue, MD;  Location: [facility] OR;  Service: Ophthalmology;  Laterality: Left;   PHOTOCOAGULATION WITH LASER Left 09/05/2021   Procedure: PHOTOCOAGULATION WITH LASER;  Surgeon: Valdemar Rogue, MD;  Location: [facility] OR;  Service: Ophthalmology;  Laterality: Left;   SCLERAL BUCKLE Left 09/05/2021   Procedure: SCLERAL BUCKLE;  Surgeon: Valdemar Rogue, MD;  Location: [facility] OR;  Service: Ophthalmology;  Laterality: Left;   UPPER GASTROINTESTINAL ENDOSCOPY     FAMILY HISTORY Family History  Problem Relation Age of Onset   Retinal detachment Sister    Diabetes Sister    Glaucoma Brother    Diabetes Brother    Colon cancer Brother        dx at 18   Esophageal cancer Neg Hx    Rectal cancer Neg Hx    Stomach cancer Neg Hx    SOCIAL HISTORY Social History   Tobacco Use   Smoking status: Former   Smokeless tobacco: Never   Tobacco comments:    quit smoking in 2002 or 2003  Vaping Use   Vaping status: Never Used  Substance Use Topics   Alcohol use: Yes  Comment: very rarely   Drug use: No       OPHTHALMIC EXAM:  Base Eye Exam     Visual Acuity (Snellen - Linear)       Right Left   Dist [place] 20/25 +1 20/350 -1   Dist ph [place] NI NI         Tonometry (Tonopen, 8:41 AM)       Right Left   Pressure 8 45         Tonometry #2 (Tonopen, 9:10 AM)       Right Left   Pressure  33,32         Tonometry Comments   Brom/dorz os         Pupils       Pupils Dark Light Shape React APD   Right PERRL 3 2 Round Brisk None   Left PERRL 3 2 Round Brisk None         Visual Fields       Left Right    Full Full         Neuro/Psych     Oriented x3: Yes   Mood/Affect: Normal         Dilation     Both eyes: 2.5% Phenylephrine  @ 8:42 AM           Slit Lamp and Fundus Exam     External Exam       Right Left   External  Periorbital edema         Slit Lamp Exam       Right Left   Lids/Lashes Dermatochalasis - upper lid Dermatochalasis - upper lid, periorbital erythema   Conjunctiva/Sclera white and quiet White and quiet   Cornea Clear mild tear film debris, well healed cataract wound, inferior paracentral corneal haze, trace Punctate epithelial erosions   Anterior Chamber deep and clear deep and clear, No cells or flare   Iris Round and reactive round and dilated, mild atrophy at 400    Lens PC IOL in good position PC IOL in good position   Anterior Vitreous syneresis, mild asteroid hyalosis, PVD post vitrectomy, clear -- no residual kenalog          Fundus Exam       Right Left   Disc Pink and Sharp mild Pallor, Sharp rim, Compact   C/D Ratio 0.4 0.3   Macula flat, good foveal reflex, mild RPE mottling, No heme or edema Flat, Blunted foveal reflex, RPE mottling, ERM with striae greatest nasal mac, diffuse edema/cystic changes centrally and nasally, shallow SRF, No heme   Vessels attenuated, Tortuous attenuated, Tortuous   Periphery Attached, operculated hole @ 1030 with mild surrounding pigment -- good laser surrounding, no SRF, no new RT/RD Attached, Trace pockets of SRF superior to disc with fibrosis, focal fibrosis at 0600 posterior to buckle, Retina attached over buckle, good buckle height, good laser over buckle and around tears, PRE-OP: small tear with fibrosis @ 0200, bullous, temporal RD from 1230 to 0430           IMAGING AND PROCEDURES  Imaging and Procedures for 06/02/2024  OCT, Retina - OU - Both Eyes       Right Eye Quality was good. Central Foveal Thickness: 275. Progression has been stable. Findings include normal foveal contour, no IRF, no SRF.   Left Eye Quality was good. Central Foveal Thickness: 771. Progression has been stable. Findings include abnormal foveal contour, epiretinal membrane, intraretinal fluid, macular pucker, subretinal fluid (ERM  with blunted foveal contour and mild pucker, persistent diffuse edema w/ +IRF, SRF--?SRF slightly increased).   Notes *Images captured and stored on drive  Diagnosis / Impression:  OD: NFP; no IRF/SRF OS: ERM with blunted foveal contour and mild pucker, persistent diffuse edema w/ +IRF, SRF--?SRF slightly increased   Clinical management:  See below  Abbreviations: NFP - Normal foveal profile. CME - cystoid macular edema. PED - pigment epithelial detachment. IRF - intraretinal fluid. SRF -  subretinal fluid. EZ - ellipsoid zone. ERM - epiretinal membrane. ORA - outer retinal atrophy. ORT - outer retinal tubulation. SRHM - subretinal hyper-reflective material. IRHM - intraretinal hyper-reflective material              ASSESSMENT/PLAN:    ICD-10-CM   1. Left retinal detachment  H33.22     2. Cystoid macular edema of left eye  H35.352 OCT, Retina - OU - Both Eyes    3. Epiretinal membrane (ERM) of left eye  H35.372     4. Retinal tear of right eye  H33.311     5. Essential hypertension  I10     6. Hypertensive retinopathy of both eyes  H35.033     7. 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 Corneal edema of left eye  H18.20     10. Combined forms of age-related cataract of right eye  H25.811     11. Pseudophakia  Z96.1        1,2. Rhegmatogenous retinal detachment with retinal hole, left eye - bullous, temporal, mac off detachment, onset of foveal involvement Tuesday, 03.14.23 by history - detached from 1230 to 430 oclock, fovea off, small tear at 0200 - s/p SBP + PPV/PFO/EL/FAX/14% C3F8 OS, 03.23.2023 - retina attached and in good position -- good buckle height and laser around breaks - FA 12.18.23 shows OD Focal staining ST periphery corresponding to laser retinopexy, OS Mild Hyperfluorescence of disc, mild perifoveal staining SN to fovea--?petaloid -- suggestive of CME - s/p STK #1 (11.20.23) for CME, #2 (02.28.24), #3 (07.03.24), #4 (07.03.25) =============== - s/p IVA OS #1 (05.01.24), #2 (05.29.24), #3 (06.26.24) -- IVA resistance =============== - s/p IVE OS #1 (07.24.24), #2 (08.21.24), #3 (09.18.24), #4 (10.16.24) =============== - s/p IVK OS #1 (11.13.24), #2 (12.11.24), #3 (01.08.25), #4 (05.06.25), #5 (06.03.25) =============== - s/p IVV OS #1 (02.05.25 -- sample) #2 (sample -- 03.10.25), #3 (sample -- 04.07.25) ================  - s/p Xipere  inj OS #1 (sample--08.05.25) **h/o good CME  response to IVK, but poor steroid/IOP response to IVK** - BCVA OS 20/350 from 20/250 - OCT shows OS: ERM with blunted foveal contour and mild pucker, persistent diffuse edema w/ +IRF, SRF--?SRF slightly increased  - ?good initial response to Xipere  -- pt reports vision improved after injection but then improvement wore off - continue holding Prolensa  (h/o medicamentosa) - continue holding PF (h/o steroid response) -- cont Lotemax  QD OS - IOP 45; 33,32 s/p brim and cosopt  in office --h/o steroid response -Continue Diamox  to 500mg  (2 tablets) in the morning  -Continue Brimonidine  BID OS, Cosopt  BID OS, and Latanoprost  at bedtime OS.  - Begin oral prednisone  40mg  QD; (pt on Metformin and Wegovy ) - recommend holding tx today w/ f/u in 4 weeks - Xipere  informed consent obtained and signed, 08.05.25 - IVK informed consent obtained and signed, 11.13.24 - IVE informed consent obtained and signed, 07.24.24 (OS) - IVV informed consent obtained and signed,  02.05.25 - Eylea  authorized for 2025 -- [employer] funding unavailable at this time - f/u 4 weeks - DFE, OCT   3. Epiretinal membrane, left eye  - ERM with persistent cystic edema - OCT shows persistent ERM w/ cystic changes slightly improved - monitor for now, but discussed possibility of repeat PPV w/ membrane peel to remove ERM if vision fails to improve  4. Retinal hole OD  - operculated retinal hole located at 1030 with mild surrounding pigment, no SRF - s/p laser retinopexy OD (03.16.23) -- good laser surrounding - monitor  5,6. Hypertensive retinopathy OU - discussed importance of tight BP control - monitor  7. Pseudophakia OU - s/p CE/IOL OS (Dr. Fleeta, 10.04.23), CE with IOL OD (11.01.23, Dr. Fleeta)  - IOL in good position, doing well  - monitor  8. Ocular hypertension OS  - h/o steroid response  - tmax 35 on 10.22.25  - s/p SLT OS w/ Dr. Austin on 12.05.23  - IOP today: 35 - continue Brimonidine  TID OS, Cosopt  TID OS, Latanoprost   at bedtime OS  Ophthalmic Meds Ordered this visit:  Meds ordered this encounter  Medications   loteprednol  (LOTEMAX ) 0.5 % ophthalmic suspension    Sig: Place 1 drop into the left eye daily.    Dispense:  15 mL    Refill:  3   predniSONE  (DELTASONE ) 10 MG tablet    Sig: Take 4 tablets (40 mg total) by mouth daily with breakfast.    Dispense:  120 tablet    Refill:  3     Return in about 4 weeks (around 06/30/2024) for CME OS, DFE, OCT.  There are no Patient Instructions on file for this visit.  This document serves as a record of services personally performed by Redell JUDITHANN Hans, MD, PhD. It was created on their behalf by Auston Muzzy, COMT. The creation of this record is the provider's dictation and/or activities during the visit.  Electronically signed by: Auston Muzzy, COMT 06/02/2024 11:28 AM  This document serves as a record of services personally performed by Redell JUDITHANN Hans, MD, PhD. It was created on their behalf by Almetta Pesa, an ophthalmic technician. The creation of this record is the provider's dictation and/or activities during the visit.    Electronically signed by: Almetta Pesa, OA, 06/02/2024  11:28 AM   Redell JUDITHANN Hans, M.D., Ph.D. Diseases & Surgery of the Retina and Vitreous [facility]     Abbreviations: M myopia (nearsighted); A astigmatism; H hyperopia (farsighted); P presbyopia; Mrx spectacle prescription;  CTL contact lenses; OD right eye; OS left eye; OU both eyes  XT exotropia; ET esotropia; PEK punctate epithelial keratitis; PEE punctate epithelial erosions; DES dry eye syndrome; MGD meibomian gland dysfunction; ATs artificial tears; PFAT's preservative free artificial tears; NSC nuclear sclerotic cataract; PSC posterior subcapsular cataract; ERM epi-retinal membrane; PVD posterior vitreous detachment; RD retinal detachment; DM diabetes mellitus; DR diabetic retinopathy; NPDR non-proliferative diabetic retinopathy; PDR  proliferative diabetic retinopathy; CSME clinically significant macular edema; DME diabetic macular edema; dbh dot blot hemorrhages; CWS cotton wool spot; POAG primary open angle glaucoma; C/D cup-to-disc ratio; HVF humphrey visual field; GVF goldmann visual field; OCT optical coherence tomography; IOP intraocular pressure; BRVO Branch retinal vein occlusion; CRVO central retinal vein occlusion; CRAO central retinal artery occlusion; BRAO branch retinal artery occlusion; RT retinal tear; SB scleral buckle; PPV pars plana vitrectomy; VH Vitreous hemorrhage; PRP panretinal laser photocoagulation; IVK intravitreal kenalog ; VMT vitreomacular traction; MH Macular hole;  NVD neovascularization of the disc; NVE neovascularization elsewhere; AREDS age related eye disease study; ARMD age related macular degeneration; POAG primary open angle glaucoma; EBMD epithelial/anterior basement membrane dystrophy; ACIOL anterior chamber intraocular lens; IOL intraocular lens; PCIOL posterior chamber intraocular lens; Phaco/IOL phacoemulsification with intraocular lens placement; PRK photorefractive keratectomy; LASIK laser assisted in situ keratomileusis; HTN hypertension; DM diabetes mellitus; COPD chronic obstructive pulmonary disease

## 2024-06-02 ENCOUNTER — Encounter (INDEPENDENT_AMBULATORY_CARE_PROVIDER_SITE_OTHER): Payer: Self-pay | Admitting: Ophthalmology

## 2024-06-02 ENCOUNTER — Ambulatory Visit (INDEPENDENT_AMBULATORY_CARE_PROVIDER_SITE_OTHER): Admitting: Ophthalmology

## 2024-06-02 DIAGNOSIS — H33311 Horseshoe tear of retina without detachment, right eye: Secondary | ICD-10-CM | POA: Diagnosis not present

## 2024-06-02 DIAGNOSIS — H40052 Ocular hypertension, left eye: Secondary | ICD-10-CM

## 2024-06-02 DIAGNOSIS — H35372 Puckering of macula, left eye: Secondary | ICD-10-CM | POA: Diagnosis not present

## 2024-06-02 DIAGNOSIS — H3322 Serous retinal detachment, left eye: Secondary | ICD-10-CM

## 2024-06-02 DIAGNOSIS — H35352 Cystoid macular degeneration, left eye: Secondary | ICD-10-CM | POA: Diagnosis not present

## 2024-06-02 DIAGNOSIS — H35033 Hypertensive retinopathy, bilateral: Secondary | ICD-10-CM

## 2024-06-02 DIAGNOSIS — I10 Essential (primary) hypertension: Secondary | ICD-10-CM | POA: Diagnosis not present

## 2024-06-02 DIAGNOSIS — H25811 Combined forms of age-related cataract, right eye: Secondary | ICD-10-CM

## 2024-06-02 DIAGNOSIS — H1820 Unspecified corneal edema: Secondary | ICD-10-CM

## 2024-06-02 DIAGNOSIS — Z961 Presence of intraocular lens: Secondary | ICD-10-CM

## 2024-06-02 MED ORDER — PREDNISONE 10 MG PO TABS: 40.0000 mg | ORAL_TABLET | Freq: Every day | ORAL | 3 refills | Status: AC

## 2024-06-02 MED ORDER — LOTEPREDNOL ETABONATE 0.5 % OP SUSP: 1.0000 [drp] | Freq: Every day | OPHTHALMIC | 3 refills | Status: AC

## 2024-06-02 MED ORDER — LATANOPROST 0.005 % OP SOLN: 1.0000 [drp] | Freq: Every day | OPHTHALMIC | 3 refills | Status: AC

## 2024-06-03 ENCOUNTER — Other Ambulatory Visit (HOSPITAL_COMMUNITY): Payer: Self-pay

## 2024-06-06 ENCOUNTER — Encounter (INDEPENDENT_AMBULATORY_CARE_PROVIDER_SITE_OTHER): Payer: Self-pay | Admitting: Ophthalmology

## 2024-06-17 ENCOUNTER — Other Ambulatory Visit (HOSPITAL_COMMUNITY): Payer: Self-pay

## 2024-06-22 ENCOUNTER — Other Ambulatory Visit (HOSPITAL_COMMUNITY): Payer: Self-pay

## 2024-06-27 NOTE — Progress Notes (Signed)
 [facility] - Clinic Note  07/01/2024    CHIEF COMPLAINT Patient presents for Retina Follow Up   HISTORY OF PRESENT ILLNESS: Alicia Miranda is a 69 y.o. female who presents to the clinic today for:   HPI     Retina Follow Up   Patient presents with  Retinal Break/Detachment.  In left eye.  This started 4 weeks ago.  Duration of 4 weeks.  Since onset it is stable.  I, the attending physician,  performed the HPI with the patient and updated documentation appropriately.        Comments   4 week retina follow up RD OS pt is reporting no vision changes noticed she denies any flashes or floaters pt is using  Diamox  500mg  (2 tablets) in the morning  Brimonidine  BID OS, Cosopt  BID OS, and Latanoprost  at bedtime OS oral prednisone  10mg   X 4        Last edited by Valdemar Rogue, MD on 07/03/2024 12:35 PM.    Pt states   Referring physician: Seabron Lenis, MD [phone] [address] [place],  [place] [address]  HISTORICAL INFORMATION:   Selected notes from the MEDICAL RECORD NUMBER Referred by Dr. Nanetta Sharps for possible RD OS LEE: 03/16/02023 Ocular Hx-  PMH- HTN    CURRENT MEDICATIONS: Current Outpatient Medications (Ophthalmic Drugs)  Medication Sig   brimonidine  (ALPHAGAN ) 0.2 % ophthalmic solution INSTILL 1 DROP INTO LEFT EYE THREE TIMES DAILY   dorzolamide -timolol  (COSOPT ) 2-0.5 % ophthalmic solution Place 1 drop into the left eye 3 (three) times daily.   latanoprost  (XALATAN ) 0.005 % ophthalmic solution Place 1 drop into the left eye at bedtime.   loteprednol  (LOTEMAX ) 0.5 % ophthalmic suspension Place 1 drop into the left eye daily.   Bromfenac  Sodium (PROLENSA ) 0.07 % SOLN Place 1 drop into the left eye daily. PT NEEDS 2 BOTTLES DISPENSED AT A TIME-CALL IF COUPON IS NEEDED   Bromfenac  Sodium 0.09 % SOLN Place 1 drop into the left eye 4 (four) times daily.   erythromycin  ophthalmic ointment Place 1 Application into the left eye at bedtime. (Patient not  taking: Reported on 05/13/2023)   latanoprost  (XALATAN ) 0.005 % ophthalmic solution Place 1 drop into the left eye at bedtime.   No current facility-administered medications for this visit. (Ophthalmic Drugs)   Current Outpatient Medications (Other)  Medication Sig   acetaZOLAMIDE  (DIAMOX ) 250 MG tablet Take 2 tablets (500 mg total) by mouth daily.   APPLE CIDER VINEGAR PO Take 2 tablets by mouth daily. gummies   Cholecalciferol (VITAMIN D3) 10 MCG (400 UNIT) tablet Take 400 Units by mouth daily.   COLLAGEN PO Take 3 capsules by mouth daily.   Denosumab  (PROLIA  Elmer) Inject 60 mg into the skin every 6 (six) months.   fluticasone (FLONASE) 50 MCG/ACT nasal spray Place 1 spray into both nostrils daily.   furosemide (LASIX) 40 MG tablet Take 40 mg by mouth 2 (two) times daily.   Glucosamine HCl (GLUCOSAMINE PO) Take 2 tablets by mouth daily.   losartan (COZAAR) 50 MG tablet Take 100 mg by mouth daily.   Magnesium 400 MG CAPS Take 400 mg by mouth daily.   Melatonin 5 MG CAPS Take 10 mg by mouth at bedtime.   metformin (FORTAMET) 500 MG (OSM) 24 hr tablet Take 500 mg by mouth daily with breakfast.   pantoprazole (PROTONIX) 40 MG tablet Take 40 mg by mouth 2 (two) times daily.   predniSONE  (DELTASONE ) 10 MG  tablet Take 4 tablets (40 mg total) by mouth daily with breakfast.   rosuvastatin (CRESTOR) 20 MG tablet Take 20 mg by mouth daily.   semaglutide -weight management (WEGOVY ) 2.4 MG/0.75ML SOAJ SQ injection Inject 2.4 mg into the skin once a week.   TURMERIC PO Take 2 tablets by mouth daily.   acetaZOLAMIDE  (DIAMOX ) 250 MG tablet Take 1 tablet (250 mg total) by mouth daily.   CINNAMON PO Take 1,000 mg by mouth every other day.   FEROSUL 325 (65 Fe) MG tablet Take 325 mg by mouth daily.   fexofenadine (ALLEGRA) 180 MG tablet Take 180 mg by mouth daily.   metformin (FORTAMET) 500 MG (OSM) 24 hr tablet    Semaglutide -Weight Management (WEGOVY ) 1 MG/0.5ML SOAJ Inject 1 mg into the skin once a week.    Semaglutide -Weight Management (WEGOVY ) 1 MG/0.5ML SOAJ Inject 1 mg into the skin once a week.   Semaglutide -Weight Management (WEGOVY ) 1.7 MG/0.75ML SOAJ Inject 1.7 mg under the skin once weekly   Semaglutide -Weight Management (WEGOVY ) 1.7 MG/0.75ML SOAJ Inject 1.7 mg into the skin once a week.   No current facility-administered medications for this visit. (Other)   REVIEW OF SYSTEMS: ROS   Positive for: Musculoskeletal, Cardiovascular, Eyes, Respiratory Negative for: Constitutional, Gastrointestinal, Neurological, Skin, Genitourinary, HENT, Endocrine, Psychiatric, Allergic/Imm, Heme/Lymph Last edited by Resa Delon ORN, COT on 07/01/2024  9:06 AM.        ALLERGIES Allergies  Allergen Reactions   Codeine Itching   PAST MEDICAL HISTORY Past Medical History:  Diagnosis Date   Allergy    Arthritis    legs   Asthma    GERD (gastroesophageal reflux disease)    Hyperlipidemia    Hypertension    Past Surgical History:  Procedure Laterality Date   ABDOMINAL HYSTERECTOMY     CHOLECYSTECTOMY     COLONOSCOPY     GAS INSERTION Left 09/05/2021   Procedure: INSERTION OF GAS - C3F8;  Surgeon: Valdemar Rogue, MD;  Location: [facility] OR;  Service: Ophthalmology;  Laterality: Left;   GAS/FLUID EXCHANGE Left 09/05/2021   Procedure: GAS/FLUID EXCHANGE;  Surgeon: Valdemar Rogue, MD;  Location: [facility] OR;  Service: Ophthalmology;  Laterality: Left;   PARS PLANA VITRECTOMY Left 09/05/2021   Procedure: PARS PLANA VITRECTOMY WITH 25 GAUGE;  Surgeon: Valdemar Rogue, MD;  Location: [facility] OR;  Service: Ophthalmology;  Laterality: Left;   PERFLUORONE INJECTION Left 09/05/2021   Procedure: PERFLUORON INJECTION;  Surgeon: Valdemar Rogue, MD;  Location: [facility] OR;  Service: Ophthalmology;  Laterality: Left;   PHOTOCOAGULATION WITH LASER Left 09/05/2021   Procedure: PHOTOCOAGULATION WITH LASER;  Surgeon: Valdemar Rogue, MD;  Location: [facility] OR;  Service: Ophthalmology;  Laterality: Left;   SCLERAL BUCKLE Left 09/05/2021    Procedure: SCLERAL BUCKLE;  Surgeon: Valdemar Rogue, MD;  Location: [facility] OR;  Service: Ophthalmology;  Laterality: Left;   UPPER GASTROINTESTINAL ENDOSCOPY     FAMILY HISTORY Family History  Problem Relation Age of Onset   Retinal detachment Sister    Diabetes Sister    Glaucoma Brother    Diabetes Brother    Colon cancer Brother        dx at 88   Esophageal cancer Neg Hx    Rectal cancer Neg Hx    Stomach cancer Neg Hx    SOCIAL HISTORY Social History   Tobacco Use   Smoking status: Former   Smokeless tobacco: Never   Tobacco comments:    quit smoking in 2002 or 2003  Vaping Use   Vaping  status: Never Used  Substance Use Topics   Alcohol use: Yes    Comment: very rarely   Drug use: No       OPHTHALMIC EXAM:  Base Eye Exam     Visual Acuity (Snellen - Linear)       Right Left   Dist [place] 20/25 20/250   Dist ph [place] NI NI         Tonometry (Tonopen, 9:12 AM)       Right Left   Pressure 14 32  1 gtts cosopt  and brim OS 916        Pupils       Pupils Dark Light Shape React APD   Right PERRL 3 2 Round Brisk None   Left PERRL 3 2 Round Brisk None         Visual Fields       Left Right    Full Full         Neuro/Psych     Oriented x3: Yes   Mood/Affect: Normal         Dilation     Both eyes: 2.5% Phenylephrine  @ 9:16 AM           Slit Lamp and Fundus Exam     External Exam       Right Left   External  Periorbital edema         Slit Lamp Exam       Right Left   Lids/Lashes Dermatochalasis - upper lid Dermatochalasis - upper lid, periorbital erythema   Conjunctiva/Sclera white and quiet White and quiet   Cornea Clear mild tear film debris, well healed cataract wound, inferior paracentral corneal haze, trace Punctate epithelial erosions   Anterior Chamber deep and clear deep and clear, No cells or flare   Iris Round and reactive round and dilated, mild atrophy at 400   Lens PC IOL in good position PC IOL in good position    Anterior Vitreous syneresis, mild asteroid hyalosis, PVD post vitrectomy, clear -- no residual kenalog          Fundus Exam       Right Left   Disc Pink and Sharp mild Pallor, Sharp rim, Compact   C/D Ratio 0.4 0.3   Macula flat, good foveal reflex, mild RPE mottling, No heme or edema Flat, Blunted foveal reflex, RPE mottling, ERM with striae greatest nasal mac, diffuse edema/cystic changes centrally and nasally, shallow SRF, No heme   Vessels attenuated, Tortuous attenuated, Tortuous   Periphery Attached, operculated hole @ 1030 with mild surrounding pigment -- good laser surrounding, no SRF, no new RT/RD Attached, Trace pockets of SRF superior to disc with fibrosis, focal fibrosis at 0600 posterior to buckle, Retina attached over buckle, good buckle height, good laser over buckle and around tears, PRE-OP: small tear with fibrosis @ 0200, bullous, temporal RD from 1230 to 0430           IMAGING AND PROCEDURES  Imaging and Procedures for 07/01/2024  OCT, Retina - OU - Both Eyes       Right Eye Quality was good. Central Foveal Thickness: 275. Progression has been stable. Findings include normal foveal contour, no IRF, no SRF.   Left Eye Quality was good. Central Foveal Thickness: 723. Progression has improved. Findings include abnormal foveal contour, epiretinal membrane, intraretinal fluid, macular pucker, subretinal fluid (ERM with blunted foveal contour and mild pucker, persistent diffuse edema w/ +IRF, SRF--?SRF-- slightly improved).   Notes *Images captured and  stored on drive  Diagnosis / Impression:  OD: NFP; no IRF/SRF OS: ERM with blunted foveal contour and mild pucker, persistent diffuse edema w/ +IRF, SRF--?SRF-- slightly improved  Clinical management:  See below  Abbreviations: NFP - Normal foveal profile. CME - cystoid macular edema. PED - pigment epithelial detachment. IRF - intraretinal fluid. SRF - subretinal fluid. EZ - ellipsoid zone. ERM - epiretinal membrane.  ORA - outer retinal atrophy. ORT - outer retinal tubulation. SRHM - subretinal hyper-reflective material. IRHM - intraretinal hyper-reflective material      Intravitreal Injection, Pharmacologic Agent - OS - Left Eye       Time Out 07/01/2024. 10:51 AM. Confirmed correct patient, procedure, site, and patient consented.   Anesthesia Topical anesthesia was used. Anesthetic medications included Lidocaine  2%, Proparacaine  0.5%.   Procedure Preparation included 5% betadine to ocular surface, eyelid speculum. A 27 gauge needle was used.   Injection: 4 mg triamcinolone  acetonide 40 MG/ML   Route: Intravitreal, Site: Left Eye   NDC: 9296-9758-98, Lot: [id number], Expiration date: 12/10/2024   Post-op Post injection exam found visual acuity of at least counting fingers. The patient tolerated the procedure well. There were no complications. The patient received written and verbal post procedure care education.   Notes An AC tap was performed following injection due to elevated IOP using a 30 gauge needle on a syringe with the plunger removed. The needle was placed at the limbus at 5 oclock and approximately less than 0.01cc of aqueous was removed from the anterior chamber. Betadine was applied to the tap area before and after the paracentesis was performed. There were no complications. The patient tolerated the procedure well. The IOP was rechecked and was found to be ~11 mmHg by digital palpation.             ASSESSMENT/PLAN:    ICD-10-CM   1. Left retinal detachment  H33.22 OCT, Retina - OU - Both Eyes    2. Cystoid macular edema of left eye  H35.352 OCT, Retina - OU - Both Eyes    Intravitreal Injection, Pharmacologic Agent - OS - Left Eye    triamcinolone  acetonide (KENALOG -40) injection 4 mg    3. Epiretinal membrane (ERM) of left eye  H35.372 OCT, Retina - OU - Both Eyes    4. Retinal tear of right eye  H33.311     5. Essential hypertension  I10     6. Hypertensive  retinopathy of both eyes  H35.033     7. 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 Rhegmatogenous retinal detachment with retinal hole, left eye - bullous, temporal, mac off detachment, onset of foveal involvement Tuesday, 03.14.23 by history - detached from 1230 to 430 oclock, fovea off, small tear at 0200 - s/p SBP + PPV/PFO/EL/FAX/14% C3F8 OS, 03.23.2023 - retina attached and in good position -- good buckle height and laser around breaks - FA 12.18.23 shows OD Focal staining ST periphery corresponding to laser retinopexy, OS Mild Hyperfluorescence of disc, mild perifoveal staining SN to fovea--?petaloid -- suggestive of CME - s/p STK #1 (11.20.23) for CME, #2 (02.28.24), #3 (07.03.24), #4 (07.03.25) =============== - s/p IVA OS #1 (05.01.24), #2 (05.29.24), #3 (06.26.24) -- IVA resistance =============== - s/p IVE OS #1 (07.24.24), #2 (08.21.24), #3 (09.18.24), #4 (10.16.24) =============== - s/p IVK OS #1 (11.13.24), #2 (12.11.24), #3 (01.08.25), #4 (05.06.25), #5 (06.03.25) =============== - s/p IVV OS #  1 (02.05.25 -- sample) #2 (sample -- 03.10.25), #3 (sample -- 04.07.25) ================  - s/p Xipere  inj OS #1 (sample--08.05.25) **h/o good CME response to IVK, but poor steroid/IOP response to IVK** - BCVA OS 20/350 from 20/250 - OCT shows OS: ERM with blunted foveal contour and mild pucker, persistent diffuse edema w/ +IRF, SRF--?SRF slightly increased  - ?good initial response to Xipere  -- pt reports vision improved after injection but then improvement wore off - continue holding Prolensa  (h/o medicamentosa) - continue holding PF (h/o steroid response) -- cont Lotemax  QD OS - IOP 32 --h/o steroid response - Continue Diamox  500mg  (2 tablets) in the morning  - Continue Brimonidine  BID OS, Cosopt  BID OS, and Latanoprost  at bedtime OS.  - Start tapering oral prednisone  -- 30mg  x 5days, then 20mg  x 5 days, 10mg  x 5 days, then  5mg  until seen her again (pt on Metformin and Wegovy ) - recommend IVK OS #6 today, 01.16.26 w/ f/u in 4-6 weeks - Xipere  informed consent obtained and signed, 08.05.25 - IVK informed consent obtained and signed, 11.13.24 - IVE informed consent obtained and signed, 07.24.24 (OS) - IVV informed consent obtained and signed, 02.05.25 - Eylea  authorized for 2025 -- [employer] funding unavailable at this time - f/u 4-6 weeks - DFE, OCT   3. Epiretinal membrane, left eye  - ERM with persistent cystic edema - OCT shows persistent ERM w/ cystic changes slightly improved - monitor for now, but discussed possibility of repeat PPV w/ membrane peel to remove ERM if vision fails to improve  4. Retinal hole OD  - operculated retinal hole located at 1030 with mild surrounding pigment, no SRF - s/p laser retinopexy OD (03.16.23) -- good laser surrounding - monitor  5,6. Hypertensive retinopathy OU - discussed importance of tight BP control - monitor  7. Pseudophakia OU - s/p CE/IOL OS (Dr. Fleeta, 10.04.23), CE with IOL OD (11.01.23, Dr. Fleeta)  - IOL in good position, doing well  - monitor  8. Ocular hypertension OS  - h/o steroid response  - tmax 35 on 10.22.25  - s/p SLT OS w/ Dr. Austin on 12.05.23  - IOP today: 32 - continue Brimonidine  TID OS, Cosopt  TID OS, Latanoprost  at bedtime OS  Ophthalmic Meds Ordered this visit:  Meds ordered this encounter  Medications   triamcinolone  acetonide (KENALOG -40) injection 4 mg     Return in about 4 weeks (around 07/29/2024) for CME OS, DFE, OCT, poss inj OS.  There are no Patient Instructions on file for this visit.  This document serves as a record of services personally performed by Redell JUDITHANN Hans, MD, PhD. It was created on their behalf by Delon Newness COT, an ophthalmic technician. The creation of this record is the provider's dictation and/or activities during the visit.    Electronically signed by: Delon Newness COT 01.12.26 12:42  PM  Redell JUDITHANN Hans, M.D., Ph.D. Diseases & Surgery of the Retina and Vitreous [facility] 07/01/2024   I have reviewed the above documentation for accuracy and completeness, and I agree with the above. Redell JUDITHANN Hans, M.D., Ph.D. 07/03/24 12:44 PM    Abbreviations: M myopia (nearsighted); A astigmatism; H hyperopia (farsighted); P presbyopia; Mrx spectacle prescription;  CTL contact lenses; OD right eye; OS left eye; OU both eyes  XT exotropia; ET esotropia; PEK punctate epithelial keratitis; PEE punctate epithelial erosions; DES dry eye syndrome; MGD meibomian gland dysfunction; ATs artificial tears; PFAT's preservative free artificial tears; NSC nuclear sclerotic  cataract; PSC posterior subcapsular cataract; ERM epi-retinal membrane; PVD posterior vitreous detachment; RD retinal detachment; DM diabetes mellitus; DR diabetic retinopathy; NPDR non-proliferative diabetic retinopathy; PDR proliferative diabetic retinopathy; CSME clinically significant macular edema; DME diabetic macular edema; dbh dot blot hemorrhages; CWS cotton wool spot; POAG primary open angle glaucoma; C/D cup-to-disc ratio; HVF humphrey visual field; GVF goldmann visual field; OCT optical coherence tomography; IOP intraocular pressure; BRVO Branch retinal vein occlusion; CRVO central retinal vein occlusion; CRAO central retinal artery occlusion; BRAO branch retinal artery occlusion; RT retinal tear; SB scleral buckle; PPV pars plana vitrectomy; VH Vitreous hemorrhage; PRP panretinal laser photocoagulation; IVK intravitreal kenalog ; VMT vitreomacular traction; MH Macular hole;  NVD neovascularization of the disc; NVE neovascularization elsewhere; AREDS age related eye disease study; ARMD age related macular degeneration; POAG primary open angle glaucoma; EBMD epithelial/anterior basement membrane dystrophy; ACIOL anterior chamber intraocular lens; IOL intraocular lens; PCIOL posterior chamber intraocular lens;  Phaco/IOL phacoemulsification with intraocular lens placement; PRK photorefractive keratectomy; LASIK laser assisted in situ keratomileusis; HTN hypertension; DM diabetes mellitus; COPD chronic obstructive pulmonary disease

## 2024-07-01 ENCOUNTER — Encounter (INDEPENDENT_AMBULATORY_CARE_PROVIDER_SITE_OTHER): Payer: Self-pay | Admitting: Ophthalmology

## 2024-07-01 ENCOUNTER — Ambulatory Visit (INDEPENDENT_AMBULATORY_CARE_PROVIDER_SITE_OTHER): Admitting: Ophthalmology

## 2024-07-01 DIAGNOSIS — H33311 Horseshoe tear of retina without detachment, right eye: Secondary | ICD-10-CM

## 2024-07-01 DIAGNOSIS — H35372 Puckering of macula, left eye: Secondary | ICD-10-CM | POA: Diagnosis not present

## 2024-07-01 DIAGNOSIS — I10 Essential (primary) hypertension: Secondary | ICD-10-CM

## 2024-07-01 DIAGNOSIS — H35352 Cystoid macular degeneration, left eye: Secondary | ICD-10-CM

## 2024-07-01 DIAGNOSIS — H40052 Ocular hypertension, left eye: Secondary | ICD-10-CM | POA: Diagnosis not present

## 2024-07-01 DIAGNOSIS — H3322 Serous retinal detachment, left eye: Secondary | ICD-10-CM

## 2024-07-01 DIAGNOSIS — H35033 Hypertensive retinopathy, bilateral: Secondary | ICD-10-CM | POA: Diagnosis not present

## 2024-07-01 DIAGNOSIS — Z961 Presence of intraocular lens: Secondary | ICD-10-CM | POA: Diagnosis not present

## 2024-07-03 ENCOUNTER — Encounter (INDEPENDENT_AMBULATORY_CARE_PROVIDER_SITE_OTHER): Payer: Self-pay | Admitting: Ophthalmology

## 2024-07-03 MED ORDER — TRIAMCINOLONE ACETONIDE 40 MG/ML IJ SUSP FOR KALEIDOSCOPE
4.0000 mg | INTRAMUSCULAR | Status: AC | PRN
  Administered 2024-07-03: 4 mg via INTRAVITREAL

## 2024-07-05 ENCOUNTER — Other Ambulatory Visit (HOSPITAL_COMMUNITY): Payer: Self-pay

## 2024-07-13 ENCOUNTER — Other Ambulatory Visit (HOSPITAL_COMMUNITY): Payer: Self-pay

## 2024-07-14 ENCOUNTER — Other Ambulatory Visit (HOSPITAL_BASED_OUTPATIENT_CLINIC_OR_DEPARTMENT_OTHER): Payer: Self-pay

## 2024-07-14 ENCOUNTER — Other Ambulatory Visit (HOSPITAL_COMMUNITY): Payer: Self-pay

## 2024-07-14 ENCOUNTER — Other Ambulatory Visit: Payer: Self-pay

## 2024-07-14 MED ORDER — WEGOVY 2.4 MG/0.75ML ~~LOC~~ SOAJ
2.4000 mg | SUBCUTANEOUS | 0 refills | Status: AC
  Filled 2024-07-14: qty 9, 84d supply, fill #0

## 2024-07-19 ENCOUNTER — Other Ambulatory Visit (HOSPITAL_COMMUNITY): Payer: Self-pay

## 2024-07-21 ENCOUNTER — Other Ambulatory Visit (HOSPITAL_COMMUNITY): Payer: Self-pay

## 2024-07-29 ENCOUNTER — Encounter (INDEPENDENT_AMBULATORY_CARE_PROVIDER_SITE_OTHER): Admitting: Ophthalmology
